# Patient Record
Sex: Male | Born: 1950 | Race: White | Hispanic: No | Marital: Married | Smoking: Never smoker
Health system: Southern US, Community
[De-identification: ages and names within clinical notes are randomized; demographics above are authoritative.]

## PROBLEM LIST (undated history)

## (undated) DIAGNOSIS — J189 Pneumonia, unspecified organism: Secondary | ICD-10-CM

## (undated) DIAGNOSIS — I10 Essential (primary) hypertension: Secondary | ICD-10-CM

## (undated) DIAGNOSIS — G459 Transient cerebral ischemic attack, unspecified: Secondary | ICD-10-CM

## (undated) DIAGNOSIS — S065XAA Traumatic subdural hemorrhage with loss of consciousness status unknown, initial encounter: Principal | ICD-10-CM

## (undated) DIAGNOSIS — N400 Enlarged prostate without lower urinary tract symptoms: Secondary | ICD-10-CM

## (undated) DIAGNOSIS — E7800 Pure hypercholesterolemia, unspecified: Secondary | ICD-10-CM

## (undated) HISTORY — PX: PARS PLANA REPAIR OF RETINAL DEATACHMENT: SHX2165

---

## 2016-10-22 ENCOUNTER — Observation Stay (HOSPITAL_COMMUNITY)
Admission: EM | Admit: 2016-10-22 | Discharge: 2016-10-24 | Disposition: A | Discharge status: Home / Self Care | Payer: Medicare Other | Attending: Family Medicine | Admitting: Family Medicine

## 2016-10-22 ENCOUNTER — Encounter (HOSPITAL_COMMUNITY): Payer: Self-pay

## 2016-10-22 ENCOUNTER — Emergency Department (HOSPITAL_COMMUNITY): Payer: Medicare Other

## 2016-10-22 ENCOUNTER — Other Ambulatory Visit: Payer: Self-pay

## 2016-10-22 DIAGNOSIS — E7800 Pure hypercholesterolemia, unspecified: Secondary | ICD-10-CM | POA: Insufficient documentation

## 2016-10-22 DIAGNOSIS — Z79899 Other long term (current) drug therapy: Secondary | ICD-10-CM | POA: Diagnosis not present

## 2016-10-22 DIAGNOSIS — E785 Hyperlipidemia, unspecified: Secondary | ICD-10-CM | POA: Insufficient documentation

## 2016-10-22 DIAGNOSIS — G8929 Other chronic pain: Secondary | ICD-10-CM

## 2016-10-22 DIAGNOSIS — E669 Obesity, unspecified: Secondary | ICD-10-CM | POA: Insufficient documentation

## 2016-10-22 DIAGNOSIS — N400 Enlarged prostate without lower urinary tract symptoms: Secondary | ICD-10-CM | POA: Insufficient documentation

## 2016-10-22 DIAGNOSIS — G459 Transient cerebral ischemic attack, unspecified: Secondary | ICD-10-CM | POA: Diagnosis present

## 2016-10-22 DIAGNOSIS — H538 Other visual disturbances: Secondary | ICD-10-CM

## 2016-10-22 DIAGNOSIS — R51 Headache: Secondary | ICD-10-CM

## 2016-10-22 DIAGNOSIS — Z880 Allergy status to penicillin: Secondary | ICD-10-CM | POA: Insufficient documentation

## 2016-10-22 DIAGNOSIS — Z87891 Personal history of nicotine dependence: Secondary | ICD-10-CM | POA: Insufficient documentation

## 2016-10-22 DIAGNOSIS — R4781 Slurred speech: Secondary | ICD-10-CM

## 2016-10-22 DIAGNOSIS — H539 Unspecified visual disturbance: Secondary | ICD-10-CM

## 2016-10-22 DIAGNOSIS — Z6831 Body mass index (BMI) 31.0-31.9, adult: Secondary | ICD-10-CM | POA: Insufficient documentation

## 2016-10-22 DIAGNOSIS — N401 Enlarged prostate with lower urinary tract symptoms: Secondary | ICD-10-CM

## 2016-10-22 DIAGNOSIS — R030 Elevated blood-pressure reading, without diagnosis of hypertension: Secondary | ICD-10-CM | POA: Insufficient documentation

## 2016-10-22 DIAGNOSIS — R4789 Other speech disturbances: Secondary | ICD-10-CM

## 2016-10-22 LAB — COMPREHENSIVE METABOLIC PANEL
ALBUMIN: 4.3 g/dL (ref 3.5–5.0)
ALT: 20 U/L (ref 17–63)
AST: 22 U/L (ref 15–41)
Alkaline Phosphatase: 67 U/L (ref 38–126)
Anion gap: 7 (ref 5–15)
BUN: 15 mg/dL (ref 6–20)
CHLORIDE: 109 mmol/L (ref 101–111)
CO2: 24 mmol/L (ref 22–32)
CREATININE: 1.05 mg/dL (ref 0.61–1.24)
Calcium: 9.1 mg/dL (ref 8.9–10.3)
GFR calc Af Amer: >60 mL/min (ref >60)
GFR calc non Af Amer: >60 mL/min (ref >60)
GLUCOSE: 93 mg/dL (ref 65–99)
Potassium: 4.1 mmol/L (ref 3.5–5.1)
Sodium: 140 mmol/L (ref 135–145)
Total Bilirubin: 1.5 mg/dL — ABNORMAL HIGH (ref 0.3–1.2)
Total Protein: 6.6 g/dL (ref 6.5–8.1)

## 2016-10-22 LAB — CBC
HCT: 40.9 % (ref 39.0–52.0)
Hemoglobin: 14.3 g/dL (ref 13.0–17.0)
MCH: 30 pg (ref 26.0–34.0)
MCHC: 35 g/dL (ref 30.0–36.0)
MCV: 85.7 fL (ref 78.0–100.0)
Platelets: 183 10*3/uL (ref 150–400)
RBC: 4.77 MIL/uL (ref 4.22–5.81)
RDW: 13.3 % (ref 11.5–15.5)
WBC: 6.3 10*3/uL (ref 4.0–10.5)

## 2016-10-22 LAB — DIFFERENTIAL
BASOS ABS: 0 10*3/uL (ref 0.0–0.1)
BASOS PCT: 1 %
EOS ABS: 0.1 10*3/uL (ref 0.0–0.7)
Eosinophils Relative: 2 %
LYMPHS ABS: 1.3 10*3/uL (ref 0.7–4.0)
Lymphocytes Relative: 20 %
Monocytes Absolute: 0.3 10*3/uL (ref 0.1–1.0)
Monocytes Relative: 5 %
NEUTROS ABS: 4.5 10*3/uL (ref 1.7–7.7)
NEUTROS PCT: 72 %

## 2016-10-22 LAB — APTT: APTT: 28 s (ref 24–36)

## 2016-10-22 LAB — I-STAT CHEM 8, ED
BUN: 18 mg/dL (ref 6–20)
CREATININE: 1.1 mg/dL (ref 0.61–1.24)
Calcium, Ion: 1.18 mmol/L (ref 1.15–1.40)
Chloride: 107 mmol/L (ref 101–111)
GLUCOSE: 88 mg/dL (ref 65–99)
HEMATOCRIT: 39 % (ref 39.0–52.0)
Hemoglobin: 13.3 g/dL (ref 13.0–17.0)
POTASSIUM: 4 mmol/L (ref 3.5–5.1)
Sodium: 141 mmol/L (ref 135–145)
TCO2: 24 mmol/L (ref 0–100)

## 2016-10-22 LAB — I-STAT TROPONIN, ED: Troponin i, poc: 0 ng/mL (ref 0.00–0.08)

## 2016-10-22 LAB — PROTIME-INR
INR: 0.94
Prothrombin Time: 12.6 seconds (ref 11.4–15.2)

## 2016-10-22 MED ORDER — ASPIRIN 325 MG PO TABS
325.0000 mg | ORAL_TABLET | Freq: Once | ORAL | Status: AC
  Administered 2016-10-23: 325 mg via ORAL
  Filled 2016-10-22: qty 1

## 2016-10-22 MED ORDER — ASPIRIN EC 81 MG PO TBEC
81.0000 mg | DELAYED_RELEASE_TABLET | Freq: Every day | ORAL | Status: DC
  Administered 2016-10-23 – 2016-10-24 (×2): 81 mg via ORAL
  Filled 2016-10-22 (×2): qty 1

## 2016-10-22 MED ORDER — ATORVASTATIN CALCIUM 40 MG PO TABS
40.0000 mg | ORAL_TABLET | Freq: Every day | ORAL | Status: DC
  Administered 2016-10-23 – 2016-10-24 (×2): 40 mg via ORAL
  Filled 2016-10-22 (×2): qty 1

## 2016-10-22 NOTE — ED Notes (Signed)
Pt is A&Ox 4 and ambulatory; no neuro deficits noted at this time; vitals WNL. Pt has been keep NPO per order; Swallow screen has not been completed in Ed;RN waiting for clarification

## 2016-10-22 NOTE — ED Provider Notes (Signed)
[facility]-EMERGENCY DEPT Provider Note   CSN: [id number] Arrival date & time: 10/22/16  1708     History   Chief Complaint Chief Complaint  Patient presents with  . Aphasia    HPI Edwin Dickerson is a 66 y.o. male.  HPI   66 year old male with no pertinent past medical history who presents to the ED with symptoms concerning for TIA with right hemianopic blurry vision and expressive aphasia that occurred 6 hours prior to arrival. Symptoms lasted for approximately 10-15 minutes and resolved spontaneously. No alleviating or aggravating factors. No other associated weakness, numbness, motor aphasia, trouble swallowing, chest pain, shortness of breath, nausea, diaphoresis. They deny any recent fevers, illnesses, or infections. Denies any other physical complaints  History reviewed. No pertinent past medical history.  Patient Active Problem List   Diagnosis Date Noted  . TIA (transient ischemic attack) 10/22/2016  . Blurred vision, bilateral   . Slurred speech   . Transient cerebral ischemia     History reviewed. No pertinent surgical history.     Home Medications    Prior to Admission medications   Not on File    Family History No family history on file.  Social History Social History  Substance Use Topics  . Smoking status: Never Smoker  . Smokeless tobacco: Never Used  . Alcohol use No     Allergies   Penicillins   Review of Systems Review of Systems All other systems are reviewed and are negative for acute change except as noted in the HPI   Physical Exam Updated Vital Signs BP (!) 152/92   Pulse (!) 55   Temp 98.1 F (36.7 C) (Oral)   Resp 16   SpO2 98%   Physical Exam  Constitutional: He is oriented to person, place, and time. He appears well-developed and well-nourished. No distress.  HENT:  Head: Normocephalic and atraumatic.  Nose: Nose normal.  Eyes: Conjunctivae and EOM are normal. Pupils are equal, round, and reactive to light. Right eye  exhibits no discharge. Left eye exhibits no discharge. No scleral icterus.  Neck: Normal range of motion. Neck supple.  Cardiovascular: Normal rate and regular rhythm.  Exam reveals no gallop and no friction rub.   No murmur heard. Pulmonary/Chest: Effort normal and breath sounds normal. No stridor. No respiratory distress. He has no rales.  Abdominal: Soft. He exhibits no distension. There is no tenderness.  Musculoskeletal: He exhibits no edema or tenderness.  Neurological: He is alert and oriented to person, place, and time.  Mental Status: Alert and oriented to person, place, and time. Attention and concentration normal. Speech clear. Able to name various objects. Recent memory is intact  Cranial Nerves  II Visual Fields: Intact to confrontation. Visual fields intact. III, IV, VI: Pupils equal and reactive to light and near. Full eye movement without nystagmus  V Facial Sensation: Normal. No weakness of masticatory muscles  VII: No facial weakness or asymmetry  VIII Auditory Acuity: Grossly normal  IX/X: The uvula is midline; the palate elevates symmetrically  XI: Normal sternocleidomastoid and trapezius strength  XII: The tongue is midline. No atrophy or fasciculations.   Motor System: Muscle Strength: 5/5 and symmetric in the upper and lower extremities. No pronation or drift.  Muscle Tone: Tone and muscle bulk are normal in the upper and lower extremities.   Reflexes: DTRs: 2+ and symmetrical in all four extremities. Plantar responses are flexor bilaterally.  Coordination: Intact finger-to-nose, heel-to-shin, and rapid alternating movements. No tremor.  Sensation: Intact to  light touch, and pinprick. Negative Romberg test.  Gait: Routine and tandem gait are normal    Skin: Skin is warm and dry. No rash noted. He is not diaphoretic. No erythema.  Psychiatric: He has a normal mood and affect.  Vitals reviewed.    ED Treatments / Results  Labs (all labs ordered are listed, but  only abnormal results are displayed) Labs Reviewed  COMPREHENSIVE METABOLIC PANEL - Abnormal; Notable for the following:       Result Value   Total Bilirubin 1.5 (*)    All other components within normal limits  PROTIME-INR  APTT  CBC  DIFFERENTIAL  TSH  I-STAT TROPOININ, ED  I-STAT CHEM 8, ED  CBG MONITORING, ED    EKG  EKG Interpretation  Date/Time:  Friday Oct 22 2016 17:48:50 EDT Ventricular Rate:  54 PR Interval:  174 QRS Duration: 122 QT Interval:  474 QTC Calculation: 449 R Axis:   -38 Text Interpretation:  Sinus bradycardia Left axis deviation Septal infarct , age undetermined Abnormal ECG no stemi no prior tracing for comparison   Confirmed by [facility] LLCCARDAMA MD, PEDRO [phone]([id number]) on 10/22/2016 9:00:15 PM       Radiology Ct Head Wo Contrast  Result Date: 10/22/2016 CLINICAL DATA:  66 y/o M; blurred right-sided vision and difficulty speaking. EXAM: CT HEAD WITHOUT CONTRAST TECHNIQUE: Contiguous axial images were obtained from the base of the skull through the vertex without intravenous contrast. COMPARISON:  None. FINDINGS: Brain: No evidence of acute infarction, hemorrhage, hydrocephalus, extra-axial collection or mass lesion/mass effect. Vascular: No hyperdense vessel or unexpected calcification. Skull: Normal. Negative for fracture or focal lesion. Sinuses/Orbits: No acute finding. Other: None. IMPRESSION: No acute intracranial abnormality identified. Unremarkable CT of the head. Electronically Signed   By: Mitzi HansenLance  Furusawa-Stratton M.D.   On: 10/22/2016 18:28    Procedures Procedures (including critical care time)  Medications Ordered in ED Medications  aspirin tablet 325 mg (0 mg Oral Hold 10/22/16 2325)  aspirin EC tablet 81 mg (not administered)  atorvastatin (LIPITOR) tablet 40 mg (not administered)     Initial Impression / Assessment and Plan / ED Course  I have reviewed the triage vital signs and the nursing notes.  Pertinent labs & imaging results that were  available during my care of the patient were reviewed by me and considered in my medical decision making (see chart for details).     Presentation concerning for TIA. Labs grossly reassuring. CT head without evidence of mass lesion or intracranial hemorrhage. Discussed case with neurology who recommended admission for TIA workup. Admitted to family medicine for further workup and management.  Final Clinical Impressions(s) / ED Diagnoses   Final diagnoses:  TIA (transient ischemic attack)  TIA (transient ischemic attack)      Cardama, Amadeo GarnetPedro Eduardo, MD 10/22/16 [phone]

## 2016-10-22 NOTE — ED Notes (Signed)
RN page MD for Oral med clarification due to NPO diet and if Swallow screen to be done yet; MRI states it will be a while before patient can be scanned will come for patient on floor.

## 2016-10-22 NOTE — H&P (Signed)
Family Medicine Teaching [facility] Admission History and Physical Service Pager: [phone]  Patient name: Edwin Dickerson Medical record number: [id number] Date of birth: Nov 26, 1950 Age: 66 y.o. Gender: male  Primary Care Provider: No primary care provider on file. Consultants: Neurology Code Status: Full   Chief Complaint: Right eye blurriness and difficulty with speech  Assessment and Plan: Gearold Wainer is a 66 y.o. male with a past medical history significant for BPH presenting with right eye lateral hemianopsia and expressive aphasia concerning for TIA.  #Right Eye Hemianopsia/expressive aphasia, acute, resolved Patient presented to the ED from urgent care after having earlier today what he described sas right eye lateral hemianopsia that lasted about 10 min as well as expressive aphasia that lasted about 15 min. Both symptoms self resolved and patient returned to his baseline. Patient also noticed some ear pain and mild dizziness accompaning symptoms which eventually resolved. Symptoms reported and quick resolution most consistent with TIA. Patient risk factors are difficult to assess at this moment given the fact that he has not seen a PCP in about 20 years. Only change from baseline reported by patient and significant other was increase tiredness in the past week or two. Initial work up is negative for stroke with a head CT without any abnormal findings. Patient does not have any deficits and neurologic exam is unremarkable. Patient mentioned that he was told he had elevated cholesterol during his life insurance workup and has a brief history of smoking (1 year) and smokeless tobacco use. From a cardiac standpoint , no arrhythmias or signs of cardiac dysfunction noted, troponin was negative and no carotid bruits or sign of volume overload noted during our exam. Bradycardia noted and EKG was consistent with sinus rhythm, with left axis deviation and possible septal infarct. BP was  essentially normal on admission at 135/90 since then is mildly elevated 152/92. Will allow permissive  hypertension in the setting of TIA workup. --Admit to FMTS, admitting physician Dr.Fletke --Admit to telemetry  --Order MRA/MRI --Order Carotid Dopplers --Order ECHO --Follow up on TSH, A1c and Lipid panel --Start Aspirin 325 mg once today and transition to 81 mg daily tomorrow --Lipitor 40mg  --Follow up on am CBC, BMP --Will follow up on Neurology consult, appreciate recs  #Elevated BP, acute Patient does not have hypertension and does not take any medications for blood pressure. Wife has HTN and owns a BP cuff at home and has been taking his BP intermittently at home an reports that they have been within normal limits. Elevated BP could be secondary to TIA event as well as reported global HA. --Will continue to monitor --Allow for permissive hypertension  #Headaches, acute, ongoing Patient reports that HA started in the ED on admission. He reports the HA as global and dull without any photophobia/phonophobia, throbbing or any other concerning symptoms. He rates the pain 3/10. Most likely secondary to low po intake today and mild dehydration. Not concerning for cerebral bleed, given negative CTA --Will continue to monitor --Tylenol 650 mg q6 prn for pain  #BPH, chronic Patient was started on cialis about a year ago for enlarged prostate and has not has any problems since. Symptoms appears to be well controlled. --Will resume home Cialis   FEN/GI: NPO until patient passes swallow study, will transition to regular diet Prophylaxis: SCDs  Disposition: Anticipate discharge home tomorrow after TIA workup is complete  History of Present Illness:  Edwin Dickerson is a 66 y.o. male with a past medical history significant for BPH who  presented to the ED with right eye lateral blindness and difficulty with speech. Patient reports that half of his right eye was blurry (lateral aspect) at 1PM and   Lasted about 10 minutes before self resolving. Patient also reports that a few minutes later he noticed that he couldn't get his words out and had difficulty finding the word he wanted to use. Wife reports that he had slurred speech for about about 15 mins but it resolved on its own. Patient's wife checked his BP and it was high, so they decided to go to [facility] around 2pm. After discussion with provider they were told they needed to go to the ED for further evaluation. In the the last week, patient has felt more tired than usual. Patient also reports "woosiness" during event but denies any dizziness. Patient has a history of  right inner ear problems that affect his balance at time, but felt "woosiness" felt different form his vestibular problems. He did however reports right ear pain this afternoon. Patient denies any chest pain, palpitations, shortness of breath, abdominal pain, diaphoresis, difficulty with gait, or falls. Of note patient does not have a PCP. He was last seen by one about 20 years ago. Patient has been going to Urgent Care for his medical problems. He is currently on Cialis for BPH and was started on that by a urologist last year. Otherwise, pateint is healthy and does not take any other medications and denies any other chronic problems. He has a history of smokeless tobacco use and 1 year smoking history when he was 66 yo. Patient denies any drugs and alcohol use. No surgical history, family history significant for lung cancer (father), cardiac pacemaker (mother). In the ED, patient had a Head CT with normal findings, CBC and BMP were within normal limits. EKG show sinus bradycardia and troponin was negative x1. Neurology was consulted and recommended admission for TIA workup.   Review Of Systems: Per HPI with the following additions:   Review of Systems  All other systems reviewed and are negative.   There are no active problems to display for this patient.   Past Medical  History: History reviewed. No pertinent past medical history.  Past Surgical History: History reviewed. No pertinent surgical history.  Social History: Social History  Substance Use Topics  . Smoking status: Never Smoker  . Smokeless tobacco: Never Used  . Alcohol use No  Remote history of smokeless tobacco use and 1 year smoking history when he was 66 yo Denies alcohol use or illicit drug use  Family History: No family history of MI or stroke per patient Mother: pacemaker  Father: lung cancer   Allergies and Medications: Allergies  Allergen Reactions  . Penicillins    No current facility-administered medications on file prior to encounter.    No current outpatient prescriptions on file prior to encounter.    Objective: BP (!) 153/107   Pulse (!) 53   Temp 98.1 F (36.7 C) (Oral)   Resp 19   SpO2 99%  Physical exam: General: NAD, pleasant, able to participate in exam Cardiac: RRR, normal heart sounds, no murmurs. 2+ radial and PT pulses bilaterally Respiratory: CTAB, normal effort, No wheezes, rales or rhonchi Abdomen: soft, nontender, nondistended, no hepatic or splenomegaly, +BS Extremities: no edema or cyanosis. WWP. Skin: warm and dry, no rashes noted Neuro: No visual deficit, EOM intact, visual field test normal, CNII-XII were intact, 5/5 strength upper and lower extremities, Sensation intact bilaterally UE and LE.  Neurologic Exam   Gait, Coordination, and Reflexes   Gait Gait: normal  Coordination  Romberg: negative Finger to nose coordination: normal Heel to shin coordination: normal Tandem walking coordination: normal  Tremor  Resting tremor: absent  Reflexes  Right patellar: 2+ Left patellar: 2+ Right plantar: normal Left plantar: normal  Psych: Normal affect and mood Labs and Imaging: CBC BMET   Recent Labs Lab 10/22/16 1808 10/22/16 1827  WBC 6.3  --   HGB 14.3 13.3  HCT 40.9 39.0  PLT 183  --     Recent Labs Lab  10/22/16 1808 10/22/16 1827  NA 140 141  K 4.1 4.0  CL 109 107  CO2 24  --   BUN 15 18  CREATININE 1.05 1.10  GLUCOSE 93 88  CALCIUM 9.1  --     istat trop: 0.00 EKG: sinus bradycardia, no ischemic changes noted  Ct Head Wo Contrast Result Date: 10/22/2016 CLINICAL DATA:  66 y/o M; blurred right-sided vision and difficulty speaking. EXAM: CT HEAD WITHOUT CONTRAST TECHNIQUE: Contiguous axial images were obtained from the base of the skull through the vertex without intravenous contrast. COMPARISON:  None. FINDINGS: Brain: No evidence of acute infarction, hemorrhage, hydrocephalus, extra-axial collection or mass lesion/mass effect. Vascular: No hyperdense vessel or unexpected calcification. Skull: Normal. Negative for fracture or focal lesion. Sinuses/Orbits: No acute finding. Other: None. IMPRESSION: No acute intracranial abnormality identified. Unremarkable CT of the head. Electronically Signed   By: Mitzi HansenLance  Furusawa-Stratton M.D.   On: 10/22/2016 18:28    Lovena NeighboursAbdoulaye Diallo, MD 10/22/2016, 10:13 PM PGY-1, [facility] FPTS Intern pager: [phone], text pages welcome  UPPER LEVEL ADDENDUM  I have read the above note and made revisions highlighted in blue.  Palma HolterKanishka G Grigor Lipschutz, MD PGY-2 [facility] Pager [phone]  .

## 2016-10-22 NOTE — ED Notes (Signed)
Admitting Provider at bedside. 

## 2016-10-22 NOTE — ED Triage Notes (Signed)
Pt reports today at 1pm he noticed blurry vision to half of his right eye. Then at 1:45pm pt experiences expressive aphasia and could not get the words he wanted to say out, such as the word "balloon." This lasted about 15 mins, per pt. Pt denies numbness, tingling or any weakness during any of these symptoms. Pt currently speaking in clear complete sentences, NAD.

## 2016-10-22 NOTE — ED Notes (Signed)
ED Provider at bedside. 

## 2016-10-23 ENCOUNTER — Observation Stay (HOSPITAL_COMMUNITY): Payer: Medicare Other

## 2016-10-23 ENCOUNTER — Encounter (HOSPITAL_COMMUNITY): Payer: Self-pay

## 2016-10-23 DIAGNOSIS — N401 Enlarged prostate with lower urinary tract symptoms: Secondary | ICD-10-CM

## 2016-10-23 DIAGNOSIS — R51 Headache: Secondary | ICD-10-CM

## 2016-10-23 DIAGNOSIS — G459 Transient cerebral ischemic attack, unspecified: Secondary | ICD-10-CM | POA: Diagnosis not present

## 2016-10-23 DIAGNOSIS — R4789 Other speech disturbances: Secondary | ICD-10-CM | POA: Diagnosis not present

## 2016-10-23 DIAGNOSIS — H539 Unspecified visual disturbance: Secondary | ICD-10-CM

## 2016-10-23 DIAGNOSIS — G458 Other transient cerebral ischemic attacks and related syndromes: Secondary | ICD-10-CM | POA: Diagnosis not present

## 2016-10-23 DIAGNOSIS — R030 Elevated blood-pressure reading, without diagnosis of hypertension: Secondary | ICD-10-CM | POA: Diagnosis not present

## 2016-10-23 DIAGNOSIS — R519 Headache, unspecified: Secondary | ICD-10-CM

## 2016-10-23 LAB — LIPID PANEL
CHOL/HDL RATIO: 7.2 ratio
Cholesterol: 236 mg/dL — ABNORMAL HIGH (ref 0–200)
HDL: 33 mg/dL — AB (ref >40)
LDL Cholesterol: 171 mg/dL — ABNORMAL HIGH (ref 0–99)
Triglycerides: 158 mg/dL — ABNORMAL HIGH (ref <150)
VLDL: 32 mg/dL (ref 0–40)

## 2016-10-23 LAB — TSH: TSH: 5.761 u[IU]/mL — AB (ref 0.350–4.500)

## 2016-10-23 LAB — T4, FREE: FREE T4: 0.92 ng/dL (ref 0.61–1.12)

## 2016-10-23 LAB — CK: CK TOTAL: 95 U/L (ref 49–397)

## 2016-10-23 MED ORDER — ACETAMINOPHEN 650 MG RE SUPP: 650.0000 mg | RECTAL | Status: DC | PRN

## 2016-10-23 MED ORDER — ACETAMINOPHEN 325 MG PO TABS: 650.0000 mg | ORAL_TABLET | ORAL | Status: DC | PRN

## 2016-10-23 MED ORDER — STROKE: EARLY STAGES OF RECOVERY BOOK
Freq: Once | Status: AC
  Administered 2016-10-23: 03:00:00
  Filled 2016-10-23: qty 1

## 2016-10-23 MED ORDER — ACETAMINOPHEN 160 MG/5ML PO SOLN: 650.0000 mg | ORAL | Status: DC | PRN

## 2016-10-23 NOTE — Consult Note (Signed)
Referring Physician: Dr. Ree Kida    Chief Complaint: Expressive aphasia  HPI: Edwin Dickerson is an 66 y.o. male who presented to the [facility] ED with an episode of hemianopic blurry vision in the right eye (temporal visual field) and expressive aphasia. Symptom onset was at about 1 PM on Friday, initially with the vision complaint, followed about 45 minutes with onset of expressive aphasia. He "could not get the words out" for items in his home such as a balloon. The speech deficit lasted for about 15 minutes. There was no associated weakness or sensory numbness. In the ED, speech was clear and vision complaint had resolved.   History reviewed. No pertinent past medical history.  History reviewed. No pertinent surgical history.  No family history on file. Social History:  reports that he has never smoked. He has never used smokeless tobacco. He reports that he does not drink alcohol or use drugs.  Allergies:  Allergies  Allergen Reactions  . Penicillins     Medications:  Prior to Admission:  No prescriptions prior to admission.   Scheduled: . aspirin EC  81 mg Oral Daily  . atorvastatin  40 mg Oral q1800    ROS: No CP, SOB, nausea or fever. Other ROS as per HPI.   Physical Examination: Blood pressure 133/73, pulse (!) 55, temperature 97.9 F (36.6 C), temperature source Oral, resp. rate 18, height '5\' 7"'  (1.702 m), weight 90.3 kg (199 lb), SpO2 97 %.  HEENT: [place]/AT Lungs: Respirations unlabored Ext: Warm and well-perfused  Neurologic Examination: Mental Status: Alert, oriented, thought content appropriate.  Speech fluent. Some difficulty with repetition. Naming intact. Dyscalculia noted. No dysarthria. Has some difficulty with directional 2-step commands. Good eye contact. Pleasant and cooperative. Good fund of knowledge.  Cranial Nerves: II:  Visual fields intact bilaterally. PERRL. III,IV, VI: ptosis not present. Gaze is conjugate. Normal saccades and visual pursuits bilaterally.    V,VII: smile symmetric, facial temp sensation decreased on right VIII: hearing intact to conversation IX,X:no hypophonia or hoarseness XI: symmetric XII: midline tongue extension  Motor: Right : Upper extremity   5/5    Left:     Upper extremity   5/5  Lower extremity   5/5     Lower extremity   5/5 No pronator drift. Tone normal.  Sensory: Temp and light touch intact x 4. No extinction. Deep Tendon Reflexes:  2+ bilateral upper and lower extremities.  Plantars: Right: downgoing  Left: downgoing Cerebellar: No ataxia with FNF bilaterally Gait: Deferred   Results for orders placed or performed during the hospital encounter of 10/22/16 (from the past 48 hour(s))  Protime-INR     Status: None   Collection Time: 10/22/16  6:08 PM  Result Value Ref Range   Prothrombin Time 12.6 11.4 - 15.2 seconds   INR 0.94   APTT     Status: None   Collection Time: 10/22/16  6:08 PM  Result Value Ref Range   aPTT 28 24 - 36 seconds  CBC     Status: None   Collection Time: 10/22/16  6:08 PM  Result Value Ref Range   WBC 6.3 4.0 - 10.5 K/uL   RBC 4.77 4.22 - 5.81 MIL/uL   Hemoglobin 14.3 13.0 - 17.0 g/dL   HCT 40.9 39.0 - 52.0 %   MCV 85.7 78.0 - 100.0 fL   MCH 30.0 26.0 - 34.0 pg   MCHC 35.0 30.0 - 36.0 g/dL   RDW 13.3 11.5 - 15.5 %   Platelets  183 150 - 400 K/uL  Differential     Status: None   Collection Time: 10/22/16  6:08 PM  Result Value Ref Range   Neutrophils Relative % 72 %   Neutro Abs 4.5 1.7 - 7.7 K/uL   Lymphocytes Relative 20 %   Lymphs Abs 1.3 0.7 - 4.0 K/uL   Monocytes Relative 5 %   Monocytes Absolute 0.3 0.1 - 1.0 K/uL   Eosinophils Relative 2 %   Eosinophils Absolute 0.1 0.0 - 0.7 K/uL   Basophils Relative 1 %   Basophils Absolute 0.0 0.0 - 0.1 K/uL  Comprehensive metabolic panel     Status: Abnormal   Collection Time: 10/22/16  6:08 PM  Result Value Ref Range   Sodium 140 135 - 145 mmol/L   Potassium 4.1 3.5 - 5.1 mmol/L   Chloride 109 101 - 111 mmol/L   CO2  24 22 - 32 mmol/L   Glucose, Bld 93 65 - 99 mg/dL   BUN 15 6 - 20 mg/dL   Creatinine, Ser 1.05 0.61 - 1.24 mg/dL   Calcium 9.1 8.9 - 10.3 mg/dL   Total Protein 6.6 6.5 - 8.1 g/dL   Albumin 4.3 3.5 - 5.0 g/dL   AST 22 15 - 41 U/L   ALT 20 17 - 63 U/L   Alkaline Phosphatase 67 38 - 126 U/L   Total Bilirubin 1.5 (H) 0.3 - 1.2 mg/dL   GFR calc non Af Amer >60 >60 mL/min   GFR calc Af Amer >60 >60 mL/min    Comment: (NOTE) The eGFR has been calculated using the CKD EPI equation. This calculation has not been validated in all clinical situations. eGFR's persistently <60 mL/min signify possible Chronic Kidney Disease.    Anion gap 7 5 - 15  I-stat troponin, ED     Status: None   Collection Time: 10/22/16  6:25 PM  Result Value Ref Range   Troponin i, poc 0.00 0.00 - 0.08 ng/mL   Comment 3            Comment: Due to the release kinetics of cTnI, a negative result within the first hours of the onset of symptoms does not rule out myocardial infarction with certainty. If myocardial infarction is still suspected, repeat the test at appropriate intervals.   I-Stat Chem 8, ED     Status: None   Collection Time: 10/22/16  6:27 PM  Result Value Ref Range   Sodium 141 135 - 145 mmol/L   Potassium 4.0 3.5 - 5.1 mmol/L   Chloride 107 101 - 111 mmol/L   BUN 18 6 - 20 mg/dL   Creatinine, Ser 1.10 0.61 - 1.24 mg/dL   Glucose, Bld 88 65 - 99 mg/dL   Calcium, Ion 1.18 1.15 - 1.40 mmol/L   TCO2 24 0 - 100 mmol/L   Hemoglobin 13.3 13.0 - 17.0 g/dL   HCT 39.0 39.0 - 52.0 %  TSH     Status: Abnormal   Collection Time: 10/22/16 11:05 PM  Result Value Ref Range   TSH 5.761 (H) 0.350 - 4.500 uIU/mL    Comment: Performed by a 3rd Generation assay with a functional sensitivity of <=0.01 uIU/mL.  Lipid panel     Status: Abnormal   Collection Time: 10/23/16  2:21 AM  Result Value Ref Range   Cholesterol 236 (H) 0 - 200 mg/dL   Triglycerides 158 (H) <150 mg/dL   HDL 33 (L) >40 mg/dL   Total  CHOL/HDL Ratio 7.2  RATIO   VLDL 32 0 - 40 mg/dL   LDL Cholesterol 171 (H) 0 - 99 mg/dL    Comment:        Total Cholesterol/HDL:CHD Risk Coronary Heart Disease Risk Table                     Men   Women  1/2 Average Risk   3.4   3.3  Average Risk       5.0   4.4  2 X Average Risk   9.6   7.1  3 X Average Risk  23.4   11.0        Use the calculated Patient Ratio above and the CHD Risk Table to determine the patient's CHD Risk.        ATP III CLASSIFICATION (LDL):  <100     mg/dL   Optimal  100-129  mg/dL   Near or Above                    Optimal  130-159  mg/dL   Borderline  160-189  mg/dL   High  >190     mg/dL   Very High    Ct Head Wo Contrast  Result Date: 10/22/2016 CLINICAL DATA:  66 y/o M; blurred right-sided vision and difficulty speaking. EXAM: CT HEAD WITHOUT CONTRAST TECHNIQUE: Contiguous axial images were obtained from the base of the skull through the vertex without intravenous contrast. COMPARISON:  None. FINDINGS: Brain: No evidence of acute infarction, hemorrhage, hydrocephalus, extra-axial collection or mass lesion/mass effect. Vascular: No hyperdense vessel or unexpected calcification. Skull: Normal. Negative for fracture or focal lesion. Sinuses/Orbits: No acute finding. Other: None. IMPRESSION: No acute intracranial abnormality identified. Unremarkable CT of the head. Electronically Signed   By: Kristine Garbe M.D.   On: 10/22/2016 18:28   Mr Brain Wo Contrast  Result Date: 10/23/2016 CLINICAL DATA:  Transient RIGHT lateral hemianopsia and aphasia, ear pain and mild dizziness. Evaluate transient ischemic attack. EXAM: MRI HEAD WITHOUT CONTRAST MRA HEAD WITHOUT CONTRAST TECHNIQUE: Multiplanar, multiecho pulse sequences of the brain and surrounding structures were obtained without intravenous contrast. Angiographic images of the head were obtained using MRA technique without contrast. COMPARISON:  CT HEAD Oct 22, 2016 FINDINGS: MRI HEAD FINDINGS BRAIN: No  reduced diffusion to suggest acute ischemia. No susceptibility artifact to suggest hemorrhage. The ventricles and sulci are normal for patient's age. A few punctate supratentorial white matter FLAIR T2 hyperintensities are less than expected for age, consistent with chronic small vessel ischemic disease. No suspicious parenchymal signal, masses or mass effect. No abnormal extra-axial fluid collections. VASCULAR: Normal major intracranial vascular flow voids present at skull base. SKULL AND UPPER CERVICAL SPINE: No abnormal sellar expansion. No suspicious calvarial bone marrow signal. Craniocervical junction maintained. SINUSES/ORBITS: The mastoid air-cells and included paranasal sinuses are well-aerated. The included ocular globes and orbital contents are non-suspicious. OTHER: None. MRA HEAD FINDINGS ANTERIOR CIRCULATION: Normal flow related enhancement of the included cervical, petrous, cavernous and supraclinoid internal carotid arteries. Patent anterior communicating artery. Normal flow related enhancement of the anterior and middle cerebral arteries, including distal segments. No large vessel occlusion, high-grade stenosis, abnormal luminal irregularity, aneurysm. POSTERIOR CIRCULATION: Codominant vertebral artery's. Basilar artery is patent, with normal flow related enhancement of the main branch vessels. Normal flow related enhancement of the posterior cerebral arteries. Robust LEFT in tiny RIGHT posterior communicating arteries present. No large vessel occlusion, high-grade stenosis, abnormal luminal irregularity, aneurysm. ANATOMIC VARIANTS: None. Source images  and MIP images were reviewed. IMPRESSION: Normal noncontrast MRI head. Normal MRA head. Electronically Signed   By: Elon Alas M.D.   On: 10/23/2016 04:13   Mr Jodene Nam Head/brain VO Cm  Result Date: 10/23/2016 CLINICAL DATA:  Transient RIGHT lateral hemianopsia and aphasia, ear pain and mild dizziness. Evaluate transient ischemic attack. EXAM:  MRI HEAD WITHOUT CONTRAST MRA HEAD WITHOUT CONTRAST TECHNIQUE: Multiplanar, multiecho pulse sequences of the brain and surrounding structures were obtained without intravenous contrast. Angiographic images of the head were obtained using MRA technique without contrast. COMPARISON:  CT HEAD Oct 22, 2016 FINDINGS: MRI HEAD FINDINGS BRAIN: No reduced diffusion to suggest acute ischemia. No susceptibility artifact to suggest hemorrhage. The ventricles and sulci are normal for patient's age. A few punctate supratentorial white matter FLAIR T2 hyperintensities are less than expected for age, consistent with chronic small vessel ischemic disease. No suspicious parenchymal signal, masses or mass effect. No abnormal extra-axial fluid collections. VASCULAR: Normal major intracranial vascular flow voids present at skull base. SKULL AND UPPER CERVICAL SPINE: No abnormal sellar expansion. No suspicious calvarial bone marrow signal. Craniocervical junction maintained. SINUSES/ORBITS: The mastoid air-cells and included paranasal sinuses are well-aerated. The included ocular globes and orbital contents are non-suspicious. OTHER: None. MRA HEAD FINDINGS ANTERIOR CIRCULATION: Normal flow related enhancement of the included cervical, petrous, cavernous and supraclinoid internal carotid arteries. Patent anterior communicating artery. Normal flow related enhancement of the anterior and middle cerebral arteries, including distal segments. No large vessel occlusion, high-grade stenosis, abnormal luminal irregularity, aneurysm. POSTERIOR CIRCULATION: Codominant vertebral artery's. Basilar artery is patent, with normal flow related enhancement of the main branch vessels. Normal flow related enhancement of the posterior cerebral arteries. Robust LEFT in tiny RIGHT posterior communicating arteries present. No large vessel occlusion, high-grade stenosis, abnormal luminal irregularity, aneurysm. ANATOMIC VARIANTS: None. Source images and MIP  images were reviewed. IMPRESSION: Normal noncontrast MRI head. Normal MRA head. Electronically Signed   By: Elon Alas M.D.   On: 10/23/2016 04:13    Assessment: 66 y.o. male with transient neurological symptoms best localizable to the left cerebral hemisphere (left PCA territory followed by left MCA territory).  1. DDx includes TIA and migraine accompaniment. The latter consideration is somewhat more likely given onset of a dull headache noted by patient while in the ED, but also would be somewhat atypical given the patient's age.  2. MRI brain normal.  3. MRA head normal.  4. Stroke Risk Factors - elevated cholesterol 5. Elevated TSH. May reflect hypothyroidism, which could relate to the language dysfunction noted on exam. 6. Also on DDx for the speech component of his neurological presentation is incipient dementia with cognitive fluctuation 7. No a-fib on EKG  Plan: 1. Echocardiogram 2. Carotid dopplers 3. PT consult, OT consult, Speech consult 4. HgbA1c 5. Frequent neuro checks 6. Permissive HTN x 24 hours 7. Telemetry monitoring 8. Agree with ASA 325 mg x 1 followed by regular daily ASA at 81 mg.  9. Agree with starting Lipitor 40 mg po qd. Obtain baseline CK level.   '@Electronically'  signed: Dr. Kerney Elbe  10/23/2016, 5:35 AM

## 2016-10-23 NOTE — Progress Notes (Signed)
Pt. Transferred from ER via stretcher to room 5C-07; alert and oriented x4; wife with pt.; instructed pt./wife on call button and hospital policies, and policy re: valuables. Pt. has a deficit In reading.

## 2016-10-23 NOTE — Progress Notes (Signed)
STROKE TEAM PROGRESS NOTE   HISTORY OF PRESENT ILLNESS (per record) Edwin Dickerson is an 66 y.o. male who presented to the [facility] ED with an episode of hemianopic blurry vision in the right eye (temporal visual field) and expressive aphasia. Symptom onset was at about 1 PM on Friday, initially with the vision complaint, followed about 45 minutes with onset of expressive aphasia. He "could not get the words out" for items in his home such as a balloon. The speech deficit lasted for about 15 minutes. There was no associated weakness or sensory numbness. In the ED, speech was clear and vision complaint had resolved.    SUBJECTIVE (INTERVAL HISTORY) His wife is at bedside. His symptoms have resolved and he feels good.   OBJECTIVE Temp:  [97.7 F (36.5 C)-98.2 F (36.8 C)] 97.7 F (36.5 C) (05/26 0644) Pulse Rate:  [53-71] 56 (05/26 0644) Cardiac Rhythm: Normal sinus rhythm (05/26 0130) Resp:  [15-20] 18 (05/26 0644) BP: (114-153)/(69-107) 119/80 (05/26 0644) SpO2:  [97 %-99 %] 98 % (05/26 0644) Weight:  [90.3 kg (199 lb)] 90.3 kg (199 lb) (05/26 0059)  CBC:  Recent Labs Lab 10/22/16 1808 10/22/16 1827  WBC 6.3  --   NEUTROABS 4.5  --   HGB 14.3 13.3  HCT 40.9 39.0  MCV 85.7  --   PLT 183  --     Basic Metabolic Panel:  Recent Labs Lab 10/22/16 1808 10/22/16 1827  NA 140 141  K 4.1 4.0  CL 109 107  CO2 24  --   GLUCOSE 93 88  BUN 15 18  CREATININE 1.05 1.10  CALCIUM 9.1  --     Lipid Panel:    Component Value Date/Time   CHOL 236 (H) 10/23/2016 0221   TRIG 158 (H) 10/23/2016 0221   HDL 33 (L) 10/23/2016 0221   CHOLHDL 7.2 10/23/2016 0221   VLDL 32 10/23/2016 0221   LDLCALC 171 (H) 10/23/2016 0221   HgbA1c: No results found for: HGBA1C Urine Drug Screen: No results found for: LABOPIA, COCAINSCRNUR, LABBENZ, AMPHETMU, THCU, LABBARB  Alcohol Level No results found for: ETH  IMAGING  Ct Head Wo Contrast 10/22/2016 No acute intracranial abnormality identified.  Unremarkable CT of the head.     Mr Brain Wo Contrast 10/23/2016 Normal noncontrast MRI head. Normal MRA head.     Mr Maxine Glenn Head/brain Wo Cm 10/23/2016 Normal noncontrast MRI head.  Normal MRA head.    PHYSICAL EXAM   Physical exam: Exam: Gen: NAD, conversant, well nourised, obese, well groomed                     CV: RRR, no MRG. No Carotid Bruits. No peripheral edema, warm, nontender Eyes: Conjunctivae clear without exudates or hemorrhage  Neuro: Detailed Neurologic Exam  Speech:    Speech is normal; fluent and spontaneous with normal comprehension.  Cognition:    The patient is oriented to person, place, and time;     recent and remote memory intact;     language fluent;     normal attention, concentration, fund of knowledge Cranial Nerves:    The pupils are equal, round, and reactive to light. Visual fields are full to finger confrontation. Extraocular movements are intact. Trigeminal sensation is intact and the muscles of mastication are normal. The face is symmetric. The palate elevates in the midline. Hearing intact. Voice is normal. Shoulder shrug is normal. The tongue has normal motion without fasciculations.   Coordination:    No  dysmetria  Motor Observation:    No asymmetry, no atrophy, and no involuntary movements noted. Tone:    Normal muscle tone.    Posture:    Posture is normal. normal erect    Strength:    Strength is V/V in the upper and lower limbs.      Sensation: intact to LT     Reflex Exam:  DTR's:    Deep tendon reflexes in the upper and lower extremities are symmetrical bilaterally.       ASSESSMENT/PLAN [name] is a 66 y.o. male with no significant past medical history presenting with expressive aphasia and visual changes. He did not receive IV t-PA due to resolution of deficits.  Possible TIA:    Resultant  Resolution of symptoms  CT head - No acute intracranial abnormality identified  MRI head - normal  MRA  head - normal  Carotid Doppler - pending  2D Echo - pending  LDL - 171  HgbA1c - pending  VTE prophylaxis - SCDs  Diet Heart Room service appropriate? Yes; Fluid consistency: Thin  No antithrombotic prior to admission, now on aspirin 81 mg daily. Prefer ASA 325mg , literature is mixed but 325mg  may be more protective than 81mg .   Patient counseled to be compliant with his antithrombotic medications  Ongoing aggressive stroke risk factor management  Therapy recommendations: pending  Disposition:  Pending  Hypertension  Stable - mildly low at times  Permissive hypertension (OK if < 220/120) but gradually normalize in 5-7 days  Long-term BP goal normotensive  Hyperlipidemia  Home meds:  No lipid lowering medications prior to admission   LDL 171, goal < 70  Now on Lipitor 40 mg daily  Continue statin at discharge    Other Stroke Risk Factors  Advanced age  Obesity, Body mass index is 31.17 kg/m., recommend weight loss, diet and exercise as appropriate    Other Active Problems    Hospital day # 0   Personally examined patient and images, and have participated in and made any corrections needed to history, physical, neuro exam,assessment and plan as stated above.  I have personally obtained the history, evaluated lab date, reviewed imaging studies and agree with radiology interpretations.    Naomie DeanAntonia Ahern, MD Stroke Neurology   To contact Stroke Continuity provider, please refer to [email or web address]. After hours, contact General Neurology

## 2016-10-23 NOTE — Plan of Care (Signed)
Problem: Education: Goal: Knowledge of [facility] Education information/materials will improve Outcome: Progressing POC reviewed with pt./wife.   

## 2016-10-23 NOTE — Progress Notes (Signed)
Family Medicine Teaching Service Daily Progress Note Intern Pager: [phone]  Patient name: Edwin Dickerson Medical record number: [id number] Date of birth: [birth year] Age: 66 y.o. Gender: male  Primary Care Provider: System, Pcp Not In Consultants: Neurology Code Status: Full Code  Pt Overview and Major Events to Date:  Admitted on 5/25 Seen by Neurology 5/26  Assessment and Plan: Edwin Dickerson is a 66 y.o. male with a past medical history significant for BPH presenting with right eye lateral hemianopsia and expressive aphasia concerning for TIA.  #Right Eye Hemianopsia/expressive aphasia, acute, resolved Patient seen by neurology who seem to agree that symptoms are consistent with TIA with left PCA and MCA involvement affecting the left cerebral hemisphere. Initial workup as reveal total cholesterol 236, HDL 33,LDL171, triglycerides 158. TSH is also elevated at 5.761. Normal MRI and MRA. Awaiting risk stratification test. --Admit to telemetry  --Follow up on Carotid Dopplers --Follow up on ECHO --Follow up on A1c  --Will start 81 mg daily tomorrow --Continue Lipitor 40 mg daily  --Will follow up on Neurology consult, appreciate recs  #Elevated BP, acute This am BP is at baseline 119/80. Patient does not have hypertension and does not take any medications for blood pressure. Elevated BP could have been secondary to TIA event as well as reported global HA. --Will continue to monitor --Allow for permissive hypertension  #Headaches, acute, ongoing Patient reports that HA started in the ED on admission. He reports the HA as global and dull without any photophobia/phonophobia, throbbing or any other concerning symptoms. He rates the pain 3/10. Most likely secondary to low po intake today and mild dehydration. Not concerning for cerebral bleed, given negative CTA, MRI/MRA  --Will continue to monitor --Tylenol 650 mg q6 prn for pain  #BPH, chronic Patient was started on cialis about a  year ago for enlarged prostate and has not has any problems since. Symptoms appears to be well controlled. --Will resume home Cialis  #Elevated TSH --Will order free T4  FEN/GI: Heart healthy Diet  PPx: SCDs  Disposition: Anticipate discharge after completion of TIA workup.  Subjective:  Patient is doing well this morning, headaches have resolved and patient was able to eat. He is ambulating and would like to go home today. Discussed treatment plan and increase risks of stroke in the first 24-48hrs post TIA. Patient in agreement with plan  Objective: Temp:  [97.7 F (36.5 C)-98.2 F (36.8 C)] 97.7 F (36.5 C) (05/26 0644) Pulse Rate:  [53-71] 56 (05/26 0644) Resp:  [15-20] 18 (05/26 0644) BP: (114-153)/(69-107) 119/80 (05/26 0644) SpO2:  [97 %-99 %] 98 % (05/26 0644) Weight:  [199 lb (90.3 kg)] 199 lb (90.3 kg) (05/26 0059)   Physical Exam: General: NAD, pleasant, able to participate in exam Cardiac: RRR, normal heart sounds, no murmurs. 2+ radial and PT pulses bilaterally Respiratory: CTAB, normal effort, No wheezes, rales or rhonchi Abdomen: soft, nontender, nondistended, no hepatic or splenomegaly, +BS Extremities: no edema or cyanosis. WWP. Skin: warm and dry, no rashes noted Neuro: No visual deficit, EOM intact, visual field test normal, CNII-XII were intact, 5/5 strength upper and lower extremities, Sensation intact bilaterally UE and LE.  Neurologic Exam   Gait, Coordination, and Reflexes   Gait Gait: normal  Coordination  Romberg: negative Finger to nose coordination: normal Heel to shin coordination: normal Tandem walking coordination: normal  Tremor  Resting tremor: absent  Reflexes  Right patellar: 2+ Left patellar: 2+ Right plantar: normal Left plantar: normal  Psych: Normal affect and  mood  Laboratory:  Recent Labs Lab 10/22/16 1808 10/22/16 1827  WBC 6.3  --   HGB 14.3 13.3  HCT 40.9 39.0  PLT 183  --     Recent Labs Lab  10/22/16 1808 10/22/16 1827  NA 140 141  K 4.1 4.0  CL 109 107  CO2 24  --   BUN 15 18  CREATININE 1.05 1.10  CALCIUM 9.1  --   PROT 6.6  --   BILITOT 1.5*  --   ALKPHOS 67  --   ALT 20  --   AST 22  --   GLUCOSE 93 88   Lipid Panel     Component Value Date/Time   CHOL 236 (H) 10/23/2016 0221   TRIG 158 (H) 10/23/2016 0221   HDL 33 (L) 10/23/2016 0221   CHOLHDL 7.2 10/23/2016 0221   VLDL 32 10/23/2016 0221   LDLCALC 171 (H) 10/23/2016 0221    Imaging/Diagnostic Tests: Ct Head Wo Contrast  Result Date: 10/22/2016 CLINICAL DATA:  66 y/o M; blurred right-sided vision and difficulty speaking. EXAM: CT HEAD WITHOUT CONTRAST TECHNIQUE: Contiguous axial images were obtained from the base of the skull through the vertex without intravenous contrast. COMPARISON:  None. FINDINGS: Brain: No evidence of acute infarction, hemorrhage, hydrocephalus, extra-axial collection or mass lesion/mass effect. Vascular: No hyperdense vessel or unexpected calcification. Skull: Normal. Negative for fracture or focal lesion. Sinuses/Orbits: No acute finding. Other: None. IMPRESSION: No acute intracranial abnormality identified. Unremarkable CT of the head. Electronically Signed   By: Mitzi Hansen M.D.   On: 10/22/2016 18:28   Mr Brain Wo Contrast  Result Date: 10/23/2016 CLINICAL DATA:  Transient RIGHT lateral hemianopsia and aphasia, ear pain and mild dizziness. Evaluate transient ischemic attack. EXAM: MRI HEAD WITHOUT CONTRAST MRA HEAD WITHOUT CONTRAST TECHNIQUE: Multiplanar, multiecho pulse sequences of the brain and surrounding structures were obtained without intravenous contrast. Angiographic images of the head were obtained using MRA technique without contrast. COMPARISON:  CT HEAD Oct 22, 2016 FINDINGS: MRI HEAD FINDINGS BRAIN: No reduced diffusion to suggest acute ischemia. No susceptibility artifact to suggest hemorrhage. The ventricles and sulci are normal for patient's age. A few  punctate supratentorial white matter FLAIR T2 hyperintensities are less than expected for age, consistent with chronic small vessel ischemic disease. No suspicious parenchymal signal, masses or mass effect. No abnormal extra-axial fluid collections. VASCULAR: Normal major intracranial vascular flow voids present at skull base. SKULL AND UPPER CERVICAL SPINE: No abnormal sellar expansion. No suspicious calvarial bone marrow signal. Craniocervical junction maintained. SINUSES/ORBITS: The mastoid air-cells and included paranasal sinuses are well-aerated. The included ocular globes and orbital contents are non-suspicious. OTHER: None. MRA HEAD FINDINGS ANTERIOR CIRCULATION: Normal flow related enhancement of the included cervical, petrous, cavernous and supraclinoid internal carotid arteries. Patent anterior communicating artery. Normal flow related enhancement of the anterior and middle cerebral arteries, including distal segments. No large vessel occlusion, high-grade stenosis, abnormal luminal irregularity, aneurysm. POSTERIOR CIRCULATION: Codominant vertebral artery's. Basilar artery is patent, with normal flow related enhancement of the main branch vessels. Normal flow related enhancement of the posterior cerebral arteries. Robust LEFT in tiny RIGHT posterior communicating arteries present. No large vessel occlusion, high-grade stenosis, abnormal luminal irregularity, aneurysm. ANATOMIC VARIANTS: None. Source images and MIP images were reviewed. IMPRESSION: Normal noncontrast MRI head. Normal MRA head. Electronically Signed   By: Awilda Metro M.D.   On: 10/23/2016 04:13   Mr Maxine Glenn Head/brain RU Cm  Result Date: 10/23/2016 CLINICAL DATA:  Transient RIGHT lateral  hemianopsia and aphasia, ear pain and mild dizziness. Evaluate transient ischemic attack. EXAM: MRI HEAD WITHOUT CONTRAST MRA HEAD WITHOUT CONTRAST TECHNIQUE: Multiplanar, multiecho pulse sequences of the brain and surrounding structures were obtained  without intravenous contrast. Angiographic images of the head were obtained using MRA technique without contrast. COMPARISON:  CT HEAD Oct 22, 2016 FINDINGS: MRI HEAD FINDINGS BRAIN: No reduced diffusion to suggest acute ischemia. No susceptibility artifact to suggest hemorrhage. The ventricles and sulci are normal for patient's age. A few punctate supratentorial white matter FLAIR T2 hyperintensities are less than expected for age, consistent with chronic small vessel ischemic disease. No suspicious parenchymal signal, masses or mass effect. No abnormal extra-axial fluid collections. VASCULAR: Normal major intracranial vascular flow voids present at skull base. SKULL AND UPPER CERVICAL SPINE: No abnormal sellar expansion. No suspicious calvarial bone marrow signal. Craniocervical junction maintained. SINUSES/ORBITS: The mastoid air-cells and included paranasal sinuses are well-aerated. The included ocular globes and orbital contents are non-suspicious. OTHER: None. MRA HEAD FINDINGS ANTERIOR CIRCULATION: Normal flow related enhancement of the included cervical, petrous, cavernous and supraclinoid internal carotid arteries. Patent anterior communicating artery. Normal flow related enhancement of the anterior and middle cerebral arteries, including distal segments. No large vessel occlusion, high-grade stenosis, abnormal luminal irregularity, aneurysm. POSTERIOR CIRCULATION: Codominant vertebral artery's. Basilar artery is patent, with normal flow related enhancement of the main branch vessels. Normal flow related enhancement of the posterior cerebral arteries. Robust LEFT in tiny RIGHT posterior communicating arteries present. No large vessel occlusion, high-grade stenosis, abnormal luminal irregularity, aneurysm. ANATOMIC VARIANTS: None. Source images and MIP images were reviewed. IMPRESSION: Normal noncontrast MRI head. Normal MRA head. Electronically Signed   By: Awilda Metroourtnay  Bloomer M.D.   On: 10/23/2016 04:13      Lovena Neighboursiallo, Wilman Tucker, MD 10/23/2016, 6:52 AM PGY-1, [facility] FPTS Intern pager: [phone], text pages welcome

## 2016-10-23 NOTE — ED Notes (Signed)
Admitting Md request NPO be discontinued; pt to have swallow screen and can eat/drink upon passing.

## 2016-10-24 ENCOUNTER — Observation Stay (HOSPITAL_BASED_OUTPATIENT_CLINIC_OR_DEPARTMENT_OTHER): Payer: Medicare Other

## 2016-10-24 DIAGNOSIS — G459 Transient cerebral ischemic attack, unspecified: Secondary | ICD-10-CM | POA: Diagnosis not present

## 2016-10-24 DIAGNOSIS — H539 Unspecified visual disturbance: Secondary | ICD-10-CM | POA: Diagnosis not present

## 2016-10-24 DIAGNOSIS — N401 Enlarged prostate with lower urinary tract symptoms: Secondary | ICD-10-CM | POA: Diagnosis not present

## 2016-10-24 DIAGNOSIS — G458 Other transient cerebral ischemic attacks and related syndromes: Secondary | ICD-10-CM | POA: Diagnosis not present

## 2016-10-24 DIAGNOSIS — R4701 Aphasia: Secondary | ICD-10-CM | POA: Diagnosis not present

## 2016-10-24 DIAGNOSIS — I360 Nonrheumatic tricuspid (valve) stenosis: Secondary | ICD-10-CM

## 2016-10-24 LAB — ECHOCARDIOGRAM COMPLETE
Height: 67 in
Weight: 3184 oz

## 2016-10-24 LAB — HEMOGLOBIN A1C
Hgb A1c MFr Bld: 5.1 % (ref 4.8–5.6)
Mean Plasma Glucose: 100 mg/dL

## 2016-10-24 MED ORDER — ATORVASTATIN CALCIUM 40 MG PO TABS: 40.0000 mg | ORAL_TABLET | Freq: Every day | ORAL | 1 refills | Status: DC

## 2016-10-24 MED ORDER — ASPIRIN 81 MG PO TBEC: 81.0000 mg | DELAYED_RELEASE_TABLET | Freq: Every day | ORAL | 1 refills | Status: DC

## 2016-10-24 NOTE — Progress Notes (Signed)
Family Medicine Teaching Service Daily Progress Note Intern Pager: [phone]  Patient name: Edwin Dickerson Medical record number: [id number] Date of birth: 08/04/1950 Age: 66 y.o. Gender: male  Primary Care Provider: System, Pcp Not In Consultants: Neurology Code Status: Full Code  Pt Overview and Major Events to Date:  Admitted on 5/25 Seen by Neurology 5/26  Assessment and Plan: Edwin Richesdward Pagliaro is a 66 y.o. male with a past medical history significant for BPH presenting with right eye lateral hemianopsia and expressive aphasia concerning for TIA.  #Right Hemianopsia/expressive aphasia/headache, acute, resolved:  Normal MRI and MRA. ABCD-2 score 4, moderate risk 2-Day Stroke Risk: 4.1%, 7-Day Stroke Risk: 5.9%, 90-Day Stroke Risk: 9.8%. A1c and TFT within normal limits. Lipid panel with LDL to 171.  --Appreciate neuro recommendations --Follow up on Carotid Dopplers and ECHO --ASA 81 mg daily --Continue Lipitor 40 mg daily  --We'll discharge after echo and Doppler  #Elevated BP, acute: Normotensive without intervention. Elevated BP could have been secondary to TIA event as well as reported global HA. --Will continue to monitor  #BPH, chronic: On Cialis daily. Denies urinary symptoms today. -Tinea on Cialis  FEN/GI: Heart healthy Diet   PPx: SCDs  Disposition: Anticipate discharge after doppler and echocardiogram  Subjective:  No complaints this morning. No more neuro symptoms. Ready to go home  Objective: Temp:  [97.5 F (36.4 C)-98.1 F (36.7 C)] 97.8 F (36.6 C) (05/27 0856) Pulse Rate:  [46-63] 63 (05/27 0856) Resp:  [16-18] 18 (05/27 0856) BP: (109-121)/(50-74) 113/69 (05/27 0856) SpO2:  [97 %-99 %] 97 % (05/27 0856)   Physical Exam: GEN: appears well, no apparent distress. Head: normocephalic and atraumatic  Eyes: conjunctiva without injection, sclera anicteric Oropharynx: mmm without erythema or exudation. Poor dentition CVS: RRR, nl s1 & s2, no murmurs, no  edema RESP: no IWOB, good air movement bilaterally, CTAB GI: BS present & normal, soft, NTND MSK: no focal tenderness or notable swelling SKIN: no apparent skin lesion NEURO: Awake, alert and oiented appropriately, CN II-12 within normal limits. Motor 5/5 in all extremities. Light sensation intact. Negative pronator drift. Finger-to-nose within normal limits. PSYCH: euthymic mood with congruent affect  Laboratory:  Recent Labs Lab 10/22/16 1808 10/22/16 1827  WBC 6.3  --   HGB 14.3 13.3  HCT 40.9 39.0  PLT 183  --     Recent Labs Lab 10/22/16 1808 10/22/16 1827  NA 140 141  K 4.1 4.0  CL 109 107  CO2 24  --   BUN 15 18  CREATININE 1.05 1.10  CALCIUM 9.1  --   PROT 6.6  --   BILITOT 1.5*  --   ALKPHOS 67  --   ALT 20  --   AST 22  --   GLUCOSE 93 88   Lipid Panel     Component Value Date/Time   CHOL 236 (H) 10/23/2016 0221   TRIG 158 (H) 10/23/2016 0221   HDL 33 (L) 10/23/2016 0221   CHOLHDL 7.2 10/23/2016 0221   VLDL 32 10/23/2016 0221   LDLCALC 171 (H) 10/23/2016 0221    Imaging/Diagnostic Tests: No results found.   Almon HerculesGonfa, Elda Dunkerson T, MD 10/24/2016, 11:38 AM PGY-2, [facility] FPTS Intern pager: [phone], text pages welcome

## 2016-10-24 NOTE — Progress Notes (Signed)
VASCULAR LAB PRELIMINARY  PRELIMINARY  PRELIMINARY  PRELIMINARY  Carotid duplex completed.    Preliminary report:  1-39% ICA plaquing. Vertebral artery flow is antegrade.   Tekeya Geffert, RVT 10/24/2016, 2:22 PM

## 2016-10-24 NOTE — Progress Notes (Signed)
STROKE TEAM PROGRESS NOTE   HISTORY OF PRESENT ILLNESS (per record) Edwin Dickerson is an 66 y.o. male who presented to the [facility] ED with an episode of hemianopic blurry vision in the right eye (temporal visual field) and expressive aphasia. Symptom onset was at about 1 PM on Friday, initially with the vision complaint, followed about 45 minutes with onset of expressive aphasia. He "could not get the words out" for items in his home such as a balloon. The speech deficit lasted for about 15 minutes. There was no associated weakness or sensory numbness. In the ED, speech was clear and vision complaint had resolved.    SUBJECTIVE (INTERVAL HISTORY) His wife is at bedside. His symptoms have resolved and he feels good.   OBJECTIVE Temp:  [97.5 F (36.4 C)-98.1 F (36.7 C)] 97.5 F (36.4 C) (05/27 0445) Pulse Rate:  [46-62] 53 (05/27 0445) Cardiac Rhythm: Sinus bradycardia;Bundle branch block (05/26 1900) Resp:  [16-18] 18 (05/27 0445) BP: (107-121)/(50-75) 118/64 (05/27 0445) SpO2:  [96 %-100 %] 97 % (05/27 0445)  CBC:   Recent Labs Lab 10/22/16 1808 10/22/16 1827  WBC 6.3  --   NEUTROABS 4.5  --   HGB 14.3 13.3  HCT 40.9 39.0  MCV 85.7  --   PLT 183  --     Basic Metabolic Panel:   Recent Labs Lab 10/22/16 1808 10/22/16 1827  NA 140 141  K 4.1 4.0  CL 109 107  CO2 24  --   GLUCOSE 93 88  BUN 15 18  CREATININE 1.05 1.10  CALCIUM 9.1  --     Lipid Panel:     Component Value Date/Time   CHOL 236 (H) 10/23/2016 0221   TRIG 158 (H) 10/23/2016 0221   HDL 33 (L) 10/23/2016 0221   CHOLHDL 7.2 10/23/2016 0221   VLDL 32 10/23/2016 0221   LDLCALC 171 (H) 10/23/2016 0221   HgbA1c: No results found for: HGBA1C Urine Drug Screen: No results found for: LABOPIA, COCAINSCRNUR, LABBENZ, AMPHETMU, THCU, LABBARB  Alcohol Level No results found for: ETH  IMAGING  Ct Head Wo Contrast 10/22/2016 No acute intracranial abnormality identified. Unremarkable CT of the head.     Mr  Brain Wo Contrast 10/23/2016 Normal noncontrast MRI head. Normal MRA head.     Mr Maxine Glenn Head/brain Wo Cm 10/23/2016 Normal noncontrast MRI head.  Normal MRA head.    PHYSICAL EXAM   Physical exam: Exam: Gen: NAD, conversant, well nourised, obese, well groomed                     CV: RRR, no MRG. No Carotid Bruits. No peripheral edema, warm, nontender Eyes: Conjunctivae clear without exudates or hemorrhage  Neuro: Detailed Neurologic Exam  Speech:    Speech is normal; fluent and spontaneous with normal comprehension.  Cognition:    The patient is oriented to person, place, and time;     recent and remote memory intact;     language fluent;     normal attention, concentration, fund of knowledge Cranial Nerves:    The pupils are equal, round, and reactive to light. Visual fields are full to finger confrontation. Extraocular movements are intact. Trigeminal sensation is intact and the muscles of mastication are normal. The face is symmetric. The palate elevates in the midline. Hearing intact. Voice is normal. Shoulder shrug is normal. The tongue has normal motion without fasciculations.   Coordination:    No dysmetria  Motor Observation:    No  asymmetry, no atrophy, and no involuntary movements noted. Tone:    Normal muscle tone.    Posture:    Posture is normal. normal erect    Strength:    Strength is V/V in the upper and lower limbs.      Sensation: intact to LT     Reflex Exam:  DTR's:    Deep tendon reflexes in the upper and lower extremities are symmetrical bilaterally.       ASSESSMENT/PLAN [name] is a 66 y.o. male with no significant past medical history presenting with expressive aphasia and visual changes. He did not receive IV t-PA due to resolution of deficits.  TIA:    Resultant  Resolution of symptoms  CT head - No acute intracranial abnormality identified  MRI head - normal  MRA head - normal  Carotid Doppler - pending  2D  Echo - pending  LDL - 171  HgbA1c - 5.1  VTE prophylaxis - SCDs Diet Heart Room service appropriate? Yes; Fluid consistency: Thin  No antithrombotic prior to admission, now on aspirin 81 mg daily. Prefer ASA 325mg , literature is mixed but 325mg  may be more protective than 81mg .   Patient counseled to be compliant with his antithrombotic medications  Ongoing aggressive stroke risk factor management  Therapy recommendations: no therapies ordered - deficits resolved.  Disposition:  Pending  Hypertension  Stable - mildly low at times  Permissive hypertension (OK if < 220/120) but gradually normalize in 5-7 days  Long-term BP goal normotensive  Hyperlipidemia  Home meds:  No lipid lowering medications prior to admission   LDL 171, goal < 70  Now on Lipitor 40 mg daily  Continue statin at discharge    Other Stroke Risk Factors  Advanced age  Obesity, Body mass index is 31.17 kg/m., recommend weight loss, diet and exercise as appropriate    Other Active Problems    Hospital day # 0   Personally examined patient and images, and have participated in and made any corrections needed to history, physical, neuro exam,assessment and plan as stated above.  I have personally obtained the history, evaluated lab date, reviewed imaging studies and agree with radiology interpretations.   Stroke will sign off at this time.     To contact Stroke Continuity provider, please refer to [email or web address]. After hours, contact General Neurology

## 2016-10-24 NOTE — Progress Notes (Signed)
  Echocardiogram 2D Echocardiogram has been performed.  Edwin Dickerson, Edwin Dickerson 10/24/2016, 1:31 PM

## 2016-10-24 NOTE — Discharge Summary (Signed)
Family Medicine Teaching [facility] Discharge Summary  Patient name: Edwin Dickerson Medical record number: [id number] Date of birth: [birth year] Age: 66 y.o. Gender: male Date of Admission: 10/22/2016  Date of Discharge: 10/25/16 Admitting Physician: Uvaldo Rising, MD  Primary Care Provider: System, Pcp Not In Consultants: Neurology  Indication for Hospitalization: TIA (blurry vision and aphasia)  Discharge Diagnoses/Problem List:  TIA Hyperlipidemia BPH  Disposition: Home  Discharge Condition: Stable  Discharge Exam:  GEN: appears well, no apparent distress. Head: normocephalic and atraumatic  Eyes: conjunctiva without injection, sclera anicteric Oropharynx: mmm without erythema or exudation. Poor dentition CVS: RRR, nl s1 & s2, no murmurs, no edema RESP: no IWOB, good air movement bilaterally, CTAB GI: BS present & normal, soft, NTND MSK: no focal tenderness or notable swelling SKIN: no apparent skin lesion NEURO: Awake, alert and oiented appropriately, CN II-12 within normal limits. Motor 5/5 in all extremities. Light sensation intact. Negative pronator drift. Finger-to-nose within normal limits. PSYCH: euthymic mood with congruent affect  Brief Hospital Course:  Edwin Dickerson is a 66 year old male with history of BPH who was admitted due to blurry vision and slurred speech for about 10-15 minutes that has resolved on arrival to ED. He remained asymptomatic for over 24 hours from neuro standpoint.  His workup including CT head and MRI/MRA were negative for acute intracranial process. A1c 5.1. TSH mildly elevated but free T4 within normal limits.  Lipid panel with LDL to 171.  Echocardiogram with EF of 60-65% and G1DD. Carotid Doppler was done on the day of discharge but have not resulted.  Patient has ABCD-2 score of 4, that puts him at moderate risk for stroke, with 2-Day Stroke Risk: 4.1%, 7-Day Stroke Risk: 5.9%, 90-Day Stroke Risk: 9.8%. Patient was discharged on Lipitor  40 mg daily and aspirin 81 mg daily.   Issues for Follow Up:  1. TIA: Check blood pressure at follow-up. Ensure that he takes his aspirin and Lipitor 2. Carotid Doppler pending at time of discharge  Significant Procedures: None  Significant Labs and Imaging:   Recent Labs Lab 10/22/16 1808 10/22/16 1827  WBC 6.3  --   HGB 14.3 13.3  HCT 40.9 39.0  PLT 183  --     Recent Labs Lab 10/22/16 1808 10/22/16 1827  NA 140 141  K 4.1 4.0  CL 109 107  CO2 24  --   GLUCOSE 93 88  BUN 15 18  CREATININE 1.05 1.10  CALCIUM 9.1  --   ALKPHOS 67  --   AST 22  --   ALT 20  --   ALBUMIN 4.3  --    A1c 5.1 Cholesterol 236, triglyceride 158, HDL 33, LDL 171 TSH 5.76, free T4 1.32  Results/Tests Pending at Time of Discharge:  Discharge Medications:  Allergies as of 10/24/2016      Reactions   Penicillins Hives, Rash, Other (See Comments)   "blacked out" Has patient had a PCN reaction causing immediate rash, facial/tongue/throat swelling, SOB or lightheadedness with hypotension: Yes Has patient had a PCN reaction causing severe rash involving mucus membranes or skin necrosis: Yes Has patient had a PCN reaction that required hospitalization: No Has patient had a PCN reaction occurring within the last 10 years: No If all of the above answers are "NO", then may proceed with Cephalosporin use.      Medication List    STOP taking these medications   ibuprofen 200 MG tablet Commonly known as:  ADVIL,MOTRIN  TAKE these medications   ALLERGY RELIEF PO Take 1 tablet by mouth daily as needed (allergies).   aspirin 81 MG EC tablet Take 1 tablet (81 mg total) by mouth daily.   atorvastatin 40 MG tablet Commonly known as:  LIPITOR Take 1 tablet (40 mg total) by mouth daily at 6 PM.   OVER THE COUNTER MEDICATION Place 1 spray into both nostrils daily as needed (allergies).   tadalafil 5 MG tablet Commonly known as:  CIALIS Take 5 mg by mouth daily.       Discharge  Instructions: Please refer to Patient Instructions section of EMR for full details.  Patient was counseled important signs and symptoms that should prompt return to medical care, changes in medications, dietary instructions, activity restrictions, and follow up appointments.   Follow-Up Appointments: Follow-up Information    Almon HerculesGonfa, Adrienne Delay T, MD Follow up on 11/01/2016.   Specialty:  Family Medicine Why:  Hospital follow up at 9:30 am Contact information: [address] [place] [address] [phone]           Almon HerculesGonfa, Suleyma Wafer T, MD 10/25/2016, 12:05 AM PGY-2, [facility]

## 2016-10-24 NOTE — Progress Notes (Signed)
Pt discharged at this time with wife taking all personal belongings. IV discontinued, dry dressing applied. Discharge instructions provided with verbal understanding. Pt will follow up with PCP per Md summary. No noted distress.

## 2016-10-24 NOTE — Discharge Instructions (Addendum)
It has been a pleasure taking care of you! You were admitted due to brief episode of vision changes and difficulty speaking, which could be a warning sign for stroke. Luckily, your brain MRI didn't show stroke. Your symptoms have resolved as well. At this point, we think it is safe to let you go home and follow up with your primary care doctor. We are discharging you on cholesterol medicine and aspirin to lower your risk of stroke. There could be some changes made to your home medications during this hospitalization. Please, make sure to read the directions before you take them. The names and directions on how to take these medications are found on this discharge paper under medication section.  The results of the ultrasound of  the blood vessels in your neck are not ready yet. Someone will contact you if there was significant findings. Otherwise, the reports will be sent to your primary care doctor.   Please follow up at your primary care doctor's office. The address, date and time are found on the discharge paper under follow up section.  Once you are discharged, your primary care physician will handle any further medical issues. Please note that NO REFILLS for any discharge medications will be authorized once you are discharged, as it is imperative that you return to your primary care physician (or establish a relationship with a primary care physician if you do not have one) for your aftercare needs so that they can reassess your need for medications and monitor your lab values. Take care,

## 2016-10-25 LAB — VAS US CAROTID
LCCADSYS: -91 cm/s
LCCAPDIAS: 29 cm/s
LEFT ECA DIAS: -12 cm/s
LEFT VERTEBRAL DIAS: -15 cm/s
Left CCA dist dias: -27 cm/s
Left CCA prox sys: 107 cm/s
Left ICA dist dias: -26 cm/s
Left ICA dist sys: -67 cm/s
Left ICA prox dias: -32 cm/s
Left ICA prox sys: -96 cm/s
RCCADSYS: -44 cm/s
RCCAPDIAS: 21 cm/s
RCCAPSYS: 103 cm/s
RIGHT ECA DIAS: -15 cm/s
RIGHT VERTEBRAL DIAS: -16 cm/s

## 2016-10-26 ENCOUNTER — Telehealth: Payer: Self-pay | Admitting: Family Medicine

## 2016-10-26 NOTE — Telephone Encounter (Signed)
Called patient and made aware that Carotid US negative.

## 2016-11-01 ENCOUNTER — Ambulatory Visit (INDEPENDENT_AMBULATORY_CARE_PROVIDER_SITE_OTHER): Payer: Medicare Other | Admitting: Student

## 2016-11-01 ENCOUNTER — Encounter: Payer: Self-pay | Admitting: Student

## 2016-11-01 VITALS — BP 102/70 | HR 61 | Temp 97.9°F | Ht 67.0 in | Wt 198.2 lb

## 2016-11-01 DIAGNOSIS — G458 Other transient cerebral ischemic attacks and related syndromes: Secondary | ICD-10-CM

## 2016-11-01 DIAGNOSIS — G459 Transient cerebral ischemic attack, unspecified: Secondary | ICD-10-CM

## 2016-11-01 MED ORDER — ASPIRIN 81 MG PO TBEC: 81.0000 mg | DELAYED_RELEASE_TABLET | Freq: Every day | ORAL | 3 refills | Status: DC

## 2016-11-01 MED ORDER — ATORVASTATIN CALCIUM 40 MG PO TABS: 40.0000 mg | ORAL_TABLET | Freq: Every day | ORAL | 3 refills | Status: DC

## 2016-11-01 NOTE — Patient Instructions (Addendum)
It was great seeing you today! We have addressed the following issues today 1. Transient ischemic attack: Continue taking the atorvastatin and aspirin. I have renewed these prescriptions for you. I also recommend lifestyle changes including diet and exercise. See below for more on this.    Please come back and see Edwin Dickerson in a month for your wellness visit  If we did any lab work today, and the results require attention, either me or my nurse will get in touch with you. If everything is normal, you will get a letter in mail and a message via . If you don't hear from Edwin Dickerson in two weeks, please give Edwin Dickerson a call. Otherwise, we look forward to seeing you again at your next visit. If you have any questions or concerns before then, please call the clinic at [phone].  Please bring all your medications to every doctors visit  Sign up for My Chart to have easy access to your labs results, and communication with your Primary care physician.    Please check-out at the front desk before leaving the clinic.    Take Care,   Dr. Alanda SlimGonfa  Portion Size    Choose healthier foods such as 100% whole grains, vegetables, fruits, beans, nut seeds, olive oil, most vegetable oils, fat-free dietary, wild game and fish.   Avoid sweet tea, other sweetened beverages, soda, fruit juice, cold cereal and milk and trans fat.   Eat at least 3 meals and 1-2 snacks per day.  Aim for no more than 5 hours between eating.  Eat breakfast within one hour of getting up.    Exercise at least 150 minutes per week, including weight resistance exercises 3 or 4 times per week.   Try to lose at least 7-10% of your current body weight.   Exercise Guide:

## 2016-11-01 NOTE — Assessment & Plan Note (Addendum)
Resolved. CVA workup was basically unremarkable except for elevated LDL to 171. No further neuro symptoms. Good compliance with his medications. Exam within normal limits today. We'll continue aspirin 81 mg and atorvastatin 40 mg daily. Discussed about lifestyle changes including diet and exercise. Gave her handout on this. Follow-up in 1 month for his annual physical.

## 2016-11-01 NOTE — Progress Notes (Signed)
  Subjective:    Edwin Dickerson is a 66 y.o. old male here for hospital follow-up and TIA  HPI TIA: Patient was recently admitted for TIA. CVA workup including CT head, MRI, carotid Doppler and echocardiogram were basically negative. A1c was 5.1. PFT was within normal limits. LDL was elevated to 171. He was discharged on aspirin 81 mg and atorvastatin 40 mg daily. He says he has been taking aspirin 325 mg. Reports good compliance with medications. He denies problems taking his medications. He denies neuro symptoms. He does not smoke. He works on the farm Therapist, sportschicken. He says he walks a lot.   PMH/Problem List: has Blurred vision, bilateral; Slurred speech; Transient cerebral ischemia; TIA (transient ischemic attack); Vision changes; Word finding difficulty; Elevated blood pressure reading; Benign prostatic hyperplasia with lower urinary tract symptoms; and Chronic nonintractable headache on his problem list.   has no past medical history on file.  FH:  No family history on file.  SH Social History  Substance Use Topics  . Smoking status: Never Smoker  . Smokeless tobacco: Never Used  . Alcohol use No    Review of Systems Review of systems negative except for pertinent positives and negatives in history of present illness above.     Objective:     Vitals:   11/01/16 0937  BP: 102/70  Pulse: 61  Temp: 97.9 F (36.6 C)  TempSrc: Oral  SpO2: 97%  Weight: 198 lb 3.2 oz (89.9 kg)  Height: 5\' 7"  (1.702 m)    Physical Exam GEN: appears well, no apparent distress. CVS: RRR, nl S1&S2, no murmurs, no edema RESP: no IWOB, good air movement bilaterally, CTAB GI: BS present & normal, soft, NTND MSK: no focal tenderness or notable swelling SKIN: no apparent skin lesion NEURO: alert and oiented appropriately, CNs 2-12 intact, normal speech, motor 5/5 in upper and lower extremities, less sensation intact PSYCH: euthymic mood with congruent affect    Assessment and Plan:  TIA (transient  ischemic attack) Resolved. CVA workup was basically unremarkable except for elevated LDL to 171. No further neuro symptoms. Good compliance with his medications. Exam within normal limits today. We'll continue aspirin 81 mg and atorvastatin 40 mg daily. Discussed about lifestyle changes including diet and exercise. Gave her handout on this. Follow-up in 1 month for his annual physical.  Return in about 1 month (around 12/01/2016) for Annual physical.  Almon Herculesaye T Gonfa, MD 11/01/16 Pager: [phone]

## 2016-12-06 ENCOUNTER — Encounter: Payer: Self-pay | Admitting: Student

## 2016-12-06 ENCOUNTER — Ambulatory Visit (INDEPENDENT_AMBULATORY_CARE_PROVIDER_SITE_OTHER): Payer: Medicare Other | Admitting: Student

## 2016-12-06 DIAGNOSIS — Z0000 Encounter for general adult medical examination without abnormal findings: Secondary | ICD-10-CM

## 2016-12-06 MED ORDER — ZOSTER VACCINE LIVE 19400 UNT/0.65ML ~~LOC~~ SUSR: 0.6500 mL | Freq: Once | SUBCUTANEOUS | 0 refills | Status: AC

## 2016-12-06 NOTE — Patient Instructions (Addendum)
It was great seeing you today! We have addressed the following issues today 1. Staying healthy: please refer to some of our recommendations about lifestyle (diet and exercise). We also recommend regular follow-up with the dentist and eye doctors.  2.   Colon cancer screening: please call one of the numbers below to schedule a colonoscopy  3   Shingles vaccine: please take the prescription were given to the pharmacy to have the shingles vaccination  If we did any lab work today, and the results require attention, either me or my nurse will get in touch with you. If everything is normal, you will get a letter in mail and a message via . If you don't hear from [place] in two weeks, please give [place] a call. Otherwise, we look forward to seeing you again at your next visit. If you have any questions or concerns before then, please call the clinic at [phone].  Please bring all your medications to every doctors visit  Sign up for My Chart to have easy access to your labs results, and communication with your Primary care physician.    Please check-out at the front desk before leaving the clinic.    Take Care,   Dr. Alanda Slim  Portion Size    Choose healthier foods such as 100% whole grains, vegetables, fruits, beans, nut seeds, olive oil, most vegetable oils, fat-free dietary, wild game and fish.   Avoid sweet tea, other sweetened beverages, soda, fruit juice, cold cereal and milk and trans fat.   Eat at least 3 meals and 1-2 snacks per day.  Aim for no more than 5 hours between eating.  Eat breakfast within one hour of getting up.    Exercise at least 150 minutes per week, including weight resistance exercises 3 or 4 times per week.   Try to lose at least 7-10% of your current body weight.            Colon Cancer  People with early colon cancer usually have no warning signs or symptoms.  If found early, most patients can be cured, but if found when it has already spread, the chance of  survival is not as good.  Colon cancer is the second most common cause of concern is in the [place] with over 56,000 deaths from colon cancer in 2005  Colon cancer is a common, treatable disease. Screening tests can find a cancer  early, before you have symptoms, and make it more likely that you will survive the disease.  Who needs to be tested? If you are age 63-75 yrs, you should be tested for colon cancer.  Ways to be tested:  A colonoscopy the best test to detect colon cancer. It requires you to drink a bowel preparation to clean out your colon before the test. During this test, a tube with a camera inserted into your rectum and examines your entire colon. You can be given medicine to make you sleepy during the exam. Therefore, you will not be able to drive immediately after the test. There is a small risk of bowel injury during the test.   Stool cards that you can take home and take a sample of your stool is another option. The cards are not as good as colonoscopy at detecting cancer, but the tests are easier and cheaper.   To schedule the colonoscopy, you can call one of the 3 options below:  [facility]. Phone number: [phone]  [facility]. Phone number: [phone]  [facility]:  Phone number [phone]

## 2016-12-06 NOTE — Progress Notes (Signed)
Subjective:     Ramon Dredgedward is a 66 y.o. old male here for annual physical  HPI Annual physical: patient has no concern today. He reports walking daily for 20-30 minutes. However, he reports eating more recently. He says he gained two pounds in the last one month.   PMH/Problem List: has Blurred vision, bilateral; Slurred speech; Transient cerebral ischemia; TIA (transient ischemic attack); Vision changes; Word finding difficulty; Elevated blood pressure reading; Benign prostatic hyperplasia with lower urinary tract symptoms; Chronic nonintractable headache; and Routine adult health maintenance on his problem list.   has no past medical history on file.  [facility]  No family history on file.  Additional:  Heart Disease before age of 66 yrs: Cancer:   SH Social History  Substance Use Topics  . Smoking status: Never Smoker  . Smokeless tobacco: Never Used  . 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  Sleep Apnea Screening: Negative Skin Cancer Screening:  Denies skin lesion Opthalmology Visit: 1.5 years ago Dental Visit: <6 months  Immunizations  Zoster vaccine: 3762yrs & above  Review of Systems  Constitutional: Negative for appetite change, fatigue and fever.  HENT: Negative for trouble swallowing.   Eyes: Negative for visual disturbance.  Respiratory: Negative for cough, chest tightness and shortness of breath.   Cardiovascular: Negative for chest pain and leg swelling.  Gastrointestinal: Negative for abdominal distention and blood in stool.  Endocrine: Negative for polydipsia and polyuria.  Genitourinary: Negative for dysuria and hematuria.  Musculoskeletal: Negative for arthralgias and myalgias.  Skin: Negative for  rash.  Neurological: Negative for dizziness and light-headedness.  Hematological: Negative for adenopathy. Does not bruise/bleed easily.  Psychiatric/Behavioral: Negative for dysphoric mood and sleep disturbance.       Objective:   Physical Exam Vitals:   12/06/16 1544  BP: 100/60  Pulse: 72  Temp: 98.3 F (36.8 C)  TempSrc: Oral  SpO2: 92%  Weight: 200 lb (90.7 kg)  Height: 5\' 7"  (1.702 m)   Body mass index is 31.32 kg/m.  GEN: appears well, no apparent distress. Head: normocephalic and atraumatic  Eyes: conjunctiva without injection, sclera anicteric Ears: external ear and ear canal normal Nares: no rhinorrhea, congestion or erythema Oropharynx: mmm without erythema or exudation, a lot of dental fillings HEM: negative for cervical or periauricular lymphadenopathies CVS: RRR, nl s1 & s2, no murmurs, no edema,  2+ DP & PT pulses bilaterally RESP: no IWOB, good air movement bilaterally, CTAB GI: BS present & normal, soft, NTND, no guarding, no rebound, no mass MSK: no focal tenderness or notable swelling SKIN: no apparent skin lesion, no AK's ENDO: negative thyromegally NEURO: alert and oiented appropriately, no gross defecits  PSYCH: euthymic mood with congruent affect    Assessment & Plan:  Routine adult health maintenance Revisited about diet and exercise again today. Gave him handout. He is very motivated.  Discussed about the importance of colonoscopy. Gave him phone numbers to call and schedule this.  Gave him Rx for Zostavax Will screen for Hep C next blood draw. He has no significant risk except age.  Chronic conditions very stable. He reports good compliance with his medications. Review of system reassuring.  Recommended regular dental and eye visit.    Meds ordered this encounter  Medications  .  Zoster Vaccine Live, PF, (ZOSTAVAX) 16109 UNT/0.65ML injection    Sig: Inject 19,400 Units into the skin once.    Dispense:  0.65 mL    Refill:  0   Candelaria Stagers, MD.  PGY-3, [facility] Pager [phone] 12/07/16  7:49 PM

## 2016-12-07 DIAGNOSIS — Z0000 Encounter for general adult medical examination without abnormal findings: Secondary | ICD-10-CM | POA: Insufficient documentation

## 2016-12-07 NOTE — Assessment & Plan Note (Addendum)
Revisited about diet and exercise again today. Gave him handout. He is very motivated.  Discussed about the importance of colonoscopy. Gave him phone numbers to call and schedule this.  Gave him Rx for Zostavax Will screen for Hep C next blood draw. He has no significant risk except age.  Chronic conditions very stable. He reports good compliance with his medications. Review of system reassuring.  Recommended regular dental and eye visit.

## 2020-06-06 ENCOUNTER — Other Ambulatory Visit: Payer: Self-pay | Admitting: Family Medicine

## 2020-06-06 DIAGNOSIS — R972 Elevated prostate specific antigen [PSA]: Secondary | ICD-10-CM

## 2020-06-28 ENCOUNTER — Other Ambulatory Visit: Payer: Medicare Other

## 2020-07-15 ENCOUNTER — Ambulatory Visit
Admission: RE | Admit: 2020-07-15 | Discharge: 2020-07-15 | Disposition: A | Discharge status: Home / Self Care | Payer: Medicare Other | Source: Ambulatory Visit | Attending: Family Medicine | Admitting: Family Medicine

## 2020-07-15 DIAGNOSIS — R972 Elevated prostate specific antigen [PSA]: Secondary | ICD-10-CM

## 2020-07-15 MED ORDER — GADOBENATE DIMEGLUMINE 529 MG/ML IV SOLN
19.0000 mL | Freq: Once | INTRAVENOUS | Status: AC | PRN
  Administered 2020-07-15: 19 mL via INTRAVENOUS

## 2021-05-18 ENCOUNTER — Other Ambulatory Visit (HOSPITAL_COMMUNITY): Payer: Self-pay | Admitting: Orthopedic Surgery

## 2021-05-18 DIAGNOSIS — M79605 Pain in left leg: Secondary | ICD-10-CM

## 2021-05-19 ENCOUNTER — Ambulatory Visit (HOSPITAL_COMMUNITY): Payer: Medicare Other

## 2022-12-09 IMAGING — MR MR PROSTATE WO/W CM
12 series · 48 of 48 positions shown · IV contrast (Multihance 19ML)
Comparison: None.

CLINICAL DATA: Elevated PSA equal 5.7.  69-year-old male

EXAM:
MR PROSTATE WITHOUT AND WITH CONTRAST
TECHNIQUE: Multiplanar multisequence MRI images were obtained of the pelvis
centered about the prostate. Pre and post contrast images were
obtained.
CONTRAST:  19mL MULTIHANCE GADOBENATE DIMEGLUMINE 529 MG/ML IV SOLN

[Series 3: T2 · coronal · 3.0mm · 0.56mm/px · 1 of 23 slices shown (1 of 3)]
[im 1/23]
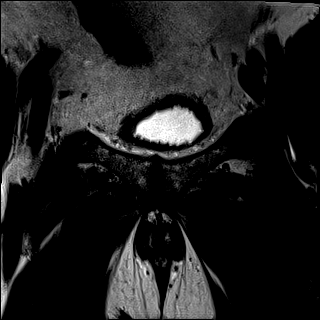

[Series 4: T1 · axial · 5.0mm · 1.25mm/px · 1 of 80 slices shown]
[im 1/80]
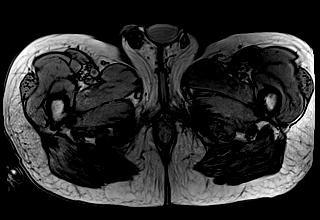

[Series 5: DWI · axial · 3.0mm · 1.75mm/px · z∈[-43,+20]mm · 2 of 66 slices shown (1 of 3)]
[im 1/66]
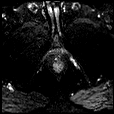
[im 66/66]
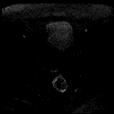

[Series 6: DWI · axial · 3.0mm · 1.75mm/px · 1 of 22 slices shown (2 of 3)]
[im 1/22]
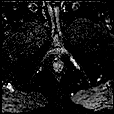

[Series 7: DWI · axial · 3.0mm · 1.75mm/px · 1 of 22 slices shown (3 of 3)]
[im 1/22]
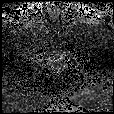

[Series 8: T2 · axial · 3.0mm · 0.56mm/px · 1 of 23 slices shown (2 of 3)]
[im 1/23]
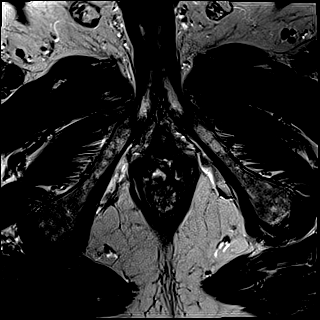

[Series 9: T2 · axial · 1.0mm · 1.04mm/px · z∈[-45,+34]mm · 2 of 80 slices shown (3 of 3)]
[im 1/80]
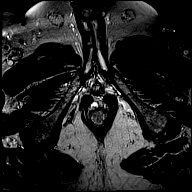
[im 80/80]
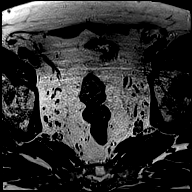

[Series 10: pre t1_twist_tra_dyn · axial · non-contrast · 3.5mm · 0.83mm/px · 1 of 20 slices shown]
[im 1/20]
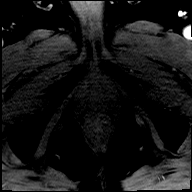

[Series 11: post t1_twist_tra_dyn-copy center · axial · non-contrast · 3.5mm · 0.83mm/px · z∈[-40,+26]mm · 17 of 600 slices shown]
[im 1/600]
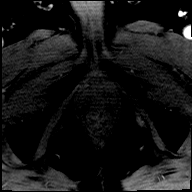
[im 38/600]
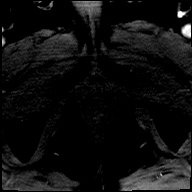
[im 75/600]
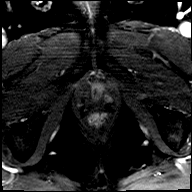
[im 113/600]
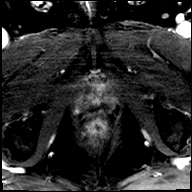
[im 150/600]
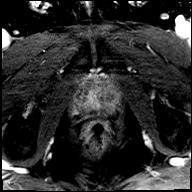
[im 188/600]
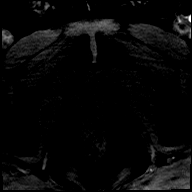
[im 225/600]
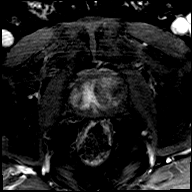
[im 263/600]
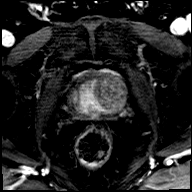
[im 300/600]
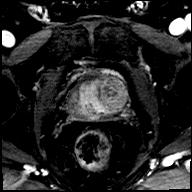
[im 337/600]
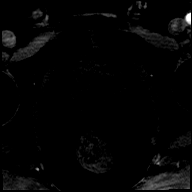
[im 375/600]
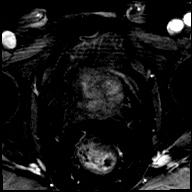
[im 412/600]
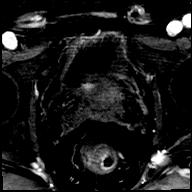
[im 450/600]
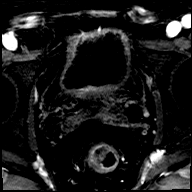
[im 487/600]
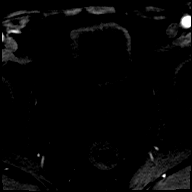
[im 525/600]
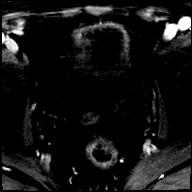
[im 562/600]
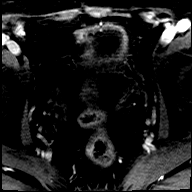
[im 600/600]
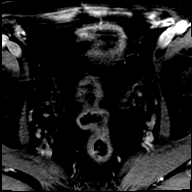

[Series 12: post t1_twist_tra_dyn-copy cent_sub · axial · 3.5mm · 0.83mm/px · z∈[-40,+26]mm · 17 of 579 slices shown]
[im 1/579]
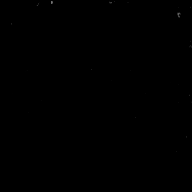
[im 37/579]
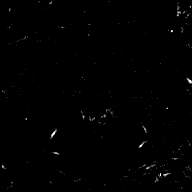
[im 73/579]
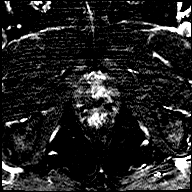
[im 109/579]
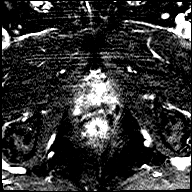
[im 145/579]
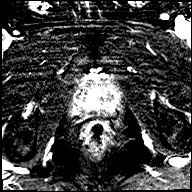
[im 181/579]
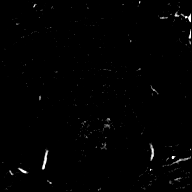
[im 217/579]
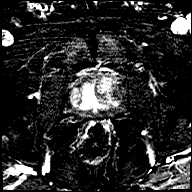
[im 253/579]
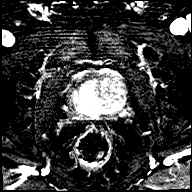
[im 290/579]
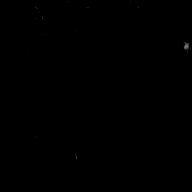
[im 326/579]
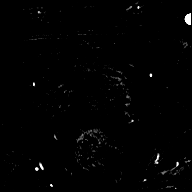
[im 362/579]
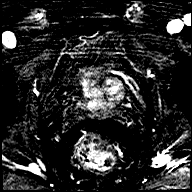
[im 398/579]
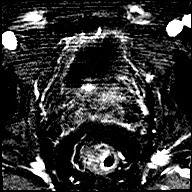
[im 434/579]
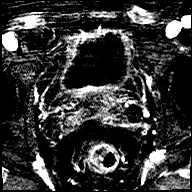
[im 470/579]
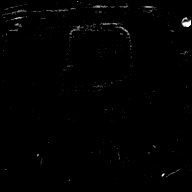
[im 506/579]
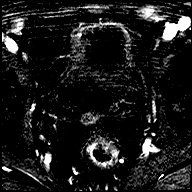
[im 542/579]
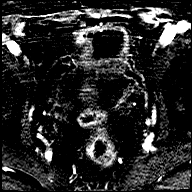
[im 579/579]
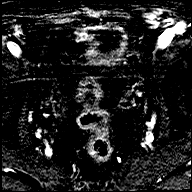

[Series 13: t1_vibe_dixon_tra_f · axial · 2.5mm · 0.91mm/px · z∈[-68,+130]mm · 2 of 80 slices shown]
[im 1/80]
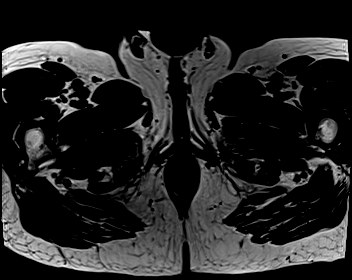
[im 80/80]
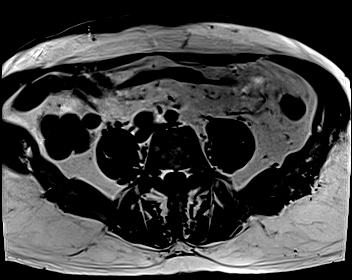

[Series 14: t1_vibe_dixon_tra_w · axial · 2.5mm · 0.91mm/px · z∈[-68,+130]mm · 2 of 80 slices shown]
[im 1/80]
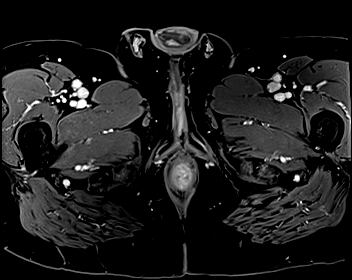
[im 80/80]
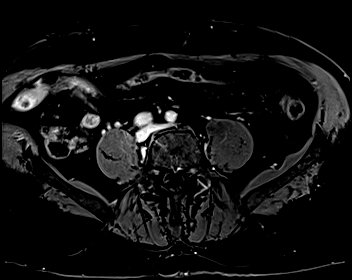

[48 of 48 positions shown; findings below may reference images not displayed]

FINDINGS: Prostate: There are no foci of restricted diffusion within the
peripheral zone (series 5 and 6).

Peripheral zone is thinned by the enlarged transitional zone. No
focal lesion on T2 weighted imaging in the peripheral zone (series
8). Postcontrast imaging demonstrates no suspicious enhancement in
the peripheral zone.

The transitional zone is enlarged by capsulated nodules. No
suspicious imaging findings on T2 weighted imaging.

The LEFT seminal vesicle is diminutive. No suspicious imaging
findings.

Volume: 5.1 x 4.0 x 5.0 cm (volume = 53 cm^3)

Transcapsular spread:  Absent

Seminal vesicle involvement: Absent

Neurovascular bundle involvement: Absent

Pelvic adenopathy: Absent

Bone metastasis: Absent

Other findings: None
IMPRESSION: 1. No high-grade carcinoma in the peripheral zone.  PI-RADS: 1
2. Enlarged nodular transitional zone most consistent benign
prostate hypertrophy. PI-RADS: 2

## 2023-10-30 HISTORY — PX: PARS PLANA REPAIR OF RETINAL DEATACHMENT: SHX2165

## 2024-02-01 ENCOUNTER — Other Ambulatory Visit: Payer: Self-pay

## 2024-02-01 ENCOUNTER — Emergency Department (HOSPITAL_COMMUNITY): Admitting: Anesthesiology

## 2024-02-01 ENCOUNTER — Encounter (HOSPITAL_COMMUNITY)
Admission: EM | Disposition: A | Discharge status: Rehabilitation Facility | Payer: Self-pay | Source: Home / Self Care | Attending: Neurological Surgery

## 2024-02-01 ENCOUNTER — Inpatient Hospital Stay (HOSPITAL_COMMUNITY)
Admission: EM | Admit: 2024-02-01 | Discharge: 2024-02-06 | DRG: 026 | Disposition: A | Discharge status: Rehabilitation Facility | Attending: Neurological Surgery | Admitting: Neurological Surgery

## 2024-02-01 ENCOUNTER — Encounter (HOSPITAL_COMMUNITY): Payer: Self-pay | Admitting: *Deleted

## 2024-02-01 ENCOUNTER — Emergency Department (HOSPITAL_COMMUNITY)

## 2024-02-01 DIAGNOSIS — Z6831 Body mass index (BMI) 31.0-31.9, adult: Secondary | ICD-10-CM | POA: Diagnosis not present

## 2024-02-01 DIAGNOSIS — S066XAA Traumatic subarachnoid hemorrhage with loss of consciousness status unknown, initial encounter: Secondary | ICD-10-CM | POA: Diagnosis present

## 2024-02-01 DIAGNOSIS — Z79899 Other long term (current) drug therapy: Secondary | ICD-10-CM | POA: Diagnosis not present

## 2024-02-01 DIAGNOSIS — N400 Enlarged prostate without lower urinary tract symptoms: Secondary | ICD-10-CM | POA: Diagnosis present

## 2024-02-01 DIAGNOSIS — Z555 Less than a high school diploma: Secondary | ICD-10-CM | POA: Diagnosis not present

## 2024-02-01 DIAGNOSIS — Z7982 Long term (current) use of aspirin: Secondary | ICD-10-CM

## 2024-02-01 DIAGNOSIS — Z880 Allergy status to penicillin: Secondary | ICD-10-CM

## 2024-02-01 DIAGNOSIS — S065X0A Traumatic subdural hemorrhage without loss of consciousness, initial encounter: Secondary | ICD-10-CM

## 2024-02-01 DIAGNOSIS — Z9889 Other specified postprocedural states: Secondary | ICD-10-CM | POA: Diagnosis not present

## 2024-02-01 DIAGNOSIS — W06XXXA Fall from bed, initial encounter: Secondary | ICD-10-CM | POA: Diagnosis present

## 2024-02-01 DIAGNOSIS — E7800 Pure hypercholesterolemia, unspecified: Secondary | ICD-10-CM | POA: Diagnosis present

## 2024-02-01 DIAGNOSIS — R4701 Aphasia: Secondary | ICD-10-CM | POA: Diagnosis present

## 2024-02-01 DIAGNOSIS — I951 Orthostatic hypotension: Secondary | ICD-10-CM | POA: Diagnosis not present

## 2024-02-01 DIAGNOSIS — G9340 Encephalopathy, unspecified: Secondary | ICD-10-CM | POA: Diagnosis present

## 2024-02-01 DIAGNOSIS — G459 Transient cerebral ischemic attack, unspecified: Secondary | ICD-10-CM

## 2024-02-01 DIAGNOSIS — R001 Bradycardia, unspecified: Secondary | ICD-10-CM | POA: Diagnosis present

## 2024-02-01 DIAGNOSIS — Z8673 Personal history of transient ischemic attack (TIA), and cerebral infarction without residual deficits: Secondary | ICD-10-CM | POA: Diagnosis not present

## 2024-02-01 DIAGNOSIS — E66811 Obesity, class 1: Secondary | ICD-10-CM | POA: Diagnosis present

## 2024-02-01 DIAGNOSIS — N179 Acute kidney failure, unspecified: Secondary | ICD-10-CM | POA: Diagnosis not present

## 2024-02-01 DIAGNOSIS — G8191 Hemiplegia, unspecified affecting right dominant side: Secondary | ICD-10-CM | POA: Diagnosis present

## 2024-02-01 DIAGNOSIS — T380X5A Adverse effect of glucocorticoids and synthetic analogues, initial encounter: Secondary | ICD-10-CM | POA: Diagnosis not present

## 2024-02-01 DIAGNOSIS — I10 Essential (primary) hypertension: Secondary | ICD-10-CM | POA: Diagnosis present

## 2024-02-01 DIAGNOSIS — S065XAA Traumatic subdural hemorrhage with loss of consciousness status unknown, initial encounter: Secondary | ICD-10-CM | POA: Diagnosis not present

## 2024-02-01 DIAGNOSIS — R739 Hyperglycemia, unspecified: Secondary | ICD-10-CM | POA: Diagnosis present

## 2024-02-01 DIAGNOSIS — S065XAD Traumatic subdural hemorrhage with loss of consciousness status unknown, subsequent encounter: Secondary | ICD-10-CM | POA: Diagnosis not present

## 2024-02-01 DIAGNOSIS — W06XXXD Fall from bed, subsequent encounter: Secondary | ICD-10-CM | POA: Diagnosis present

## 2024-02-01 DIAGNOSIS — R410 Disorientation, unspecified: Secondary | ICD-10-CM | POA: Diagnosis present

## 2024-02-01 DIAGNOSIS — Z48811 Encounter for surgical aftercare following surgery on the nervous system: Secondary | ICD-10-CM | POA: Diagnosis not present

## 2024-02-01 DIAGNOSIS — Y92003 Bedroom of unspecified non-institutional (private) residence as the place of occurrence of the external cause: Secondary | ICD-10-CM | POA: Diagnosis not present

## 2024-02-01 DIAGNOSIS — E785 Hyperlipidemia, unspecified: Secondary | ICD-10-CM

## 2024-02-01 DIAGNOSIS — K5900 Constipation, unspecified: Secondary | ICD-10-CM | POA: Diagnosis present

## 2024-02-01 DIAGNOSIS — R350 Frequency of micturition: Secondary | ICD-10-CM | POA: Diagnosis present

## 2024-02-01 DIAGNOSIS — E871 Hypo-osmolality and hyponatremia: Secondary | ICD-10-CM | POA: Diagnosis present

## 2024-02-01 DIAGNOSIS — D72829 Elevated white blood cell count, unspecified: Secondary | ICD-10-CM | POA: Diagnosis present

## 2024-02-01 DIAGNOSIS — H3322 Serous retinal detachment, left eye: Secondary | ICD-10-CM | POA: Diagnosis present

## 2024-02-01 DIAGNOSIS — I6200 Nontraumatic subdural hemorrhage, unspecified: Secondary | ICD-10-CM | POA: Diagnosis not present

## 2024-02-01 DIAGNOSIS — S065X9S Traumatic subdural hemorrhage with loss of consciousness of unspecified duration, sequela: Secondary | ICD-10-CM | POA: Diagnosis not present

## 2024-02-01 DIAGNOSIS — N401 Enlarged prostate with lower urinary tract symptoms: Secondary | ICD-10-CM | POA: Diagnosis present

## 2024-02-01 HISTORY — DX: Transient cerebral ischemic attack, unspecified: G45.9

## 2024-02-01 HISTORY — PX: CRANIOTOMY: SHX93

## 2024-02-01 HISTORY — DX: Pure hypercholesterolemia, unspecified: E78.00

## 2024-02-01 LAB — DIFFERENTIAL
Abs Immature Granulocytes: 0.06 K/uL (ref 0.00–0.07)
Basophils Absolute: 0.1 K/uL (ref 0.0–0.1)
Basophils Relative: 1 %
Eosinophils Absolute: 0.1 K/uL (ref 0.0–0.5)
Eosinophils Relative: 1 %
Immature Granulocytes: 1 %
Lymphocytes Relative: 9 %
Lymphs Abs: 0.8 K/uL (ref 0.7–4.0)
Monocytes Absolute: 0.5 K/uL (ref 0.1–1.0)
Monocytes Relative: 6 %
Neutro Abs: 7.4 K/uL (ref 1.7–7.7)
Neutrophils Relative %: 82 %

## 2024-02-01 LAB — CBC
HCT: 41.5 % (ref 39.0–52.0)
Hemoglobin: 14.6 g/dL (ref 13.0–17.0)
MCH: 30.7 pg (ref 26.0–34.0)
MCHC: 35.2 g/dL (ref 30.0–36.0)
MCV: 87.2 fL (ref 80.0–100.0)
Platelets: 215 K/uL (ref 150–400)
RBC: 4.76 MIL/uL (ref 4.22–5.81)
RDW: 12.6 % (ref 11.5–15.5)
WBC: 8.9 K/uL (ref 4.0–10.5)
nRBC: 0 % (ref 0.0–0.2)

## 2024-02-01 LAB — COMPREHENSIVE METABOLIC PANEL WITH GFR
ALT: 22 U/L (ref 0–44)
AST: 21 U/L (ref 15–41)
Albumin: 4.2 g/dL (ref 3.5–5.0)
Alkaline Phosphatase: 71 U/L (ref 38–126)
Anion gap: 15 (ref 5–15)
BUN: 15 mg/dL (ref 8–23)
CO2: 20 mmol/L — ABNORMAL LOW (ref 22–32)
Calcium: 9.1 mg/dL (ref 8.9–10.3)
Chloride: 106 mmol/L (ref 98–111)
Creatinine, Ser: 1.11 mg/dL (ref 0.61–1.24)
GFR, Estimated: >60 mL/min (ref >60)
Glucose, Bld: 100 mg/dL — ABNORMAL HIGH (ref 70–99)
Potassium: 3.9 mmol/L (ref 3.5–5.1)
Sodium: 141 mmol/L (ref 135–145)
Total Bilirubin: 2.3 mg/dL — ABNORMAL HIGH (ref 0.0–1.2)
Total Protein: 6.7 g/dL (ref 6.5–8.1)

## 2024-02-01 LAB — PROTIME-INR
INR: 1 (ref 0.8–1.2)
Prothrombin Time: 13.5 s (ref 11.4–15.2)

## 2024-02-01 LAB — I-STAT CHEM 8, ED
BUN: 16 mg/dL (ref 8–23)
Calcium, Ion: 1.15 mmol/L (ref 1.15–1.40)
Chloride: 106 mmol/L (ref 98–111)
Creatinine, Ser: 1.1 mg/dL (ref 0.61–1.24)
Glucose, Bld: 102 mg/dL — ABNORMAL HIGH (ref 70–99)
HCT: 40 % (ref 39.0–52.0)
Hemoglobin: 13.6 g/dL (ref 13.0–17.0)
Potassium: 3.9 mmol/L (ref 3.5–5.1)
Sodium: 139 mmol/L (ref 135–145)
TCO2: 21 mmol/L — ABNORMAL LOW (ref 22–32)

## 2024-02-01 LAB — TYPE AND SCREEN
ABO/RH(D): O POS
Antibody Screen: NEGATIVE

## 2024-02-01 LAB — ABO/RH: ABO/RH(D): O POS

## 2024-02-01 LAB — APTT: aPTT: 26 s (ref 24–36)

## 2024-02-01 LAB — MRSA NEXT GEN BY PCR, NASAL: MRSA by PCR Next Gen: NOT DETECTED

## 2024-02-01 SURGERY — CRANIOTOMY HEMATOMA EVACUATION SUBDURAL
Anesthesia: General | Site: Head | Laterality: Left

## 2024-02-01 MED ORDER — PHENYLEPHRINE HCL-NACL 20-0.9 MG/250ML-% IV SOLN
INTRAVENOUS | Status: DC | PRN
Start: 2024-02-01 — End: 2024-02-01
  Administered 2024-02-01: 25 ug/min via INTRAVENOUS

## 2024-02-01 MED ORDER — DOCUSATE SODIUM 100 MG PO CAPS
100.0000 mg | ORAL_CAPSULE | Freq: Two times a day (BID) | ORAL | Status: DC
  Administered 2024-02-01 – 2024-02-06 (×9): 100 mg via ORAL
  Filled 2024-02-01 (×9): qty 1

## 2024-02-01 MED ORDER — PROPOFOL 10 MG/ML IV BOLUS
INTRAVENOUS | Status: DC | PRN
  Administered 2024-02-01: 30 mg via INTRAVENOUS
  Administered 2024-02-01: 120 mg via INTRAVENOUS

## 2024-02-01 MED ORDER — ORAL CARE MOUTH RINSE: 15.0000 mL | OROMUCOSAL | Status: DC | PRN

## 2024-02-01 MED ORDER — SENNA 8.6 MG PO TABS
1.0000 | ORAL_TABLET | Freq: Two times a day (BID) | ORAL | Status: DC
  Administered 2024-02-01 – 2024-02-06 (×9): 8.6 mg via ORAL
  Filled 2024-02-01 (×9): qty 1

## 2024-02-01 MED ORDER — LIDOCAINE-EPINEPHRINE 1 %-1:100000 IJ SOLN
INTRAMUSCULAR | Status: AC
  Filled 2024-02-01: qty 1

## 2024-02-01 MED ORDER — ONDANSETRON HCL 4 MG/2ML IJ SOLN
INTRAMUSCULAR | Status: DC | PRN
  Administered 2024-02-01: 4 mg via INTRAVENOUS

## 2024-02-01 MED ORDER — LABETALOL HCL 5 MG/ML IV SOLN
10.0000 mg | INTRAVENOUS | Status: DC | PRN
  Administered 2024-02-01: 30 mg via INTRAVENOUS
  Administered 2024-02-01: 40 mg via INTRAVENOUS
  Administered 2024-02-01 (×2): 20 mg via INTRAVENOUS
  Administered 2024-02-01: 10 mg via INTRAVENOUS
  Administered 2024-02-02 (×2): 20 mg via INTRAVENOUS
  Filled 2024-02-01: qty 8
  Filled 2024-02-01: qty 4
  Filled 2024-02-01: qty 8
  Filled 2024-02-01 (×2): qty 4
  Filled 2024-02-01: qty 8
  Filled 2024-02-01: qty 4

## 2024-02-01 MED ORDER — ONDANSETRON HCL 4 MG PO TABS
4.0000 mg | ORAL_TABLET | ORAL | Status: DC | PRN
Start: 2024-02-01 — End: 2024-02-06

## 2024-02-01 MED ORDER — CEFAZOLIN SODIUM 1 G IJ SOLR
INTRAMUSCULAR | Status: AC
  Filled 2024-02-01: qty 20

## 2024-02-01 MED ORDER — CLEVIDIPINE BUTYRATE 0.5 MG/ML IV EMUL
INTRAVENOUS | Status: DC | PRN
  Administered 2024-02-01: 4 mg/h via INTRAVENOUS

## 2024-02-01 MED ORDER — SUGAMMADEX SODIUM 200 MG/2ML IV SOLN
INTRAVENOUS | Status: DC | PRN
  Administered 2024-02-01: 200 mg via INTRAVENOUS

## 2024-02-01 MED ORDER — DEXAMETHASONE 4 MG PO TABS
6.0000 mg | ORAL_TABLET | Freq: Four times a day (QID) | ORAL | Status: AC
  Administered 2024-02-01 – 2024-02-02 (×4): 6 mg via ORAL
  Filled 2024-02-01 (×4): qty 2

## 2024-02-01 MED ORDER — SODIUM CHLORIDE 0.9 % IV SOLN
INTRAVENOUS | Status: DC | PRN
  Administered 2024-02-01: 500 mg via INTRAVENOUS

## 2024-02-01 MED ORDER — LIDOCAINE-EPINEPHRINE 1 %-1:100000 IJ SOLN
INTRAMUSCULAR | Status: DC | PRN
  Administered 2024-02-01: 10 mL via INTRADERMAL

## 2024-02-01 MED ORDER — PROMETHAZINE HCL 25 MG PO TABS: 12.5000 mg | ORAL_TABLET | ORAL | Status: DC | PRN

## 2024-02-01 MED ORDER — LACTATED RINGERS IV SOLN: INTRAVENOUS | Status: DC | PRN

## 2024-02-01 MED ORDER — CEFAZOLIN SODIUM-DEXTROSE 1-4 GM/50ML-% IV SOLN
INTRAVENOUS | Status: DC | PRN
Start: 2024-02-01 — End: 2024-02-01
  Administered 2024-02-01: 2 g via INTRAVENOUS

## 2024-02-01 MED ORDER — ONDANSETRON HCL 4 MG/2ML IJ SOLN: 4.0000 mg | INTRAMUSCULAR | Status: DC | PRN

## 2024-02-01 MED ORDER — LEVETIRACETAM (KEPPRA) 500 MG/5 ML ADULT IV PUSH
500.0000 mg | Freq: Two times a day (BID) | INTRAVENOUS | Status: DC
  Administered 2024-02-01: 500 mg via INTRAVENOUS
  Filled 2024-02-01: qty 5

## 2024-02-01 MED ORDER — THROMBIN 5000 UNITS EX SOLR: CUTANEOUS | Status: DC | PRN

## 2024-02-01 MED ORDER — CHLORHEXIDINE GLUCONATE 0.12 % MT SOLN
OROMUCOSAL | Status: AC
  Filled 2024-02-01: qty 15

## 2024-02-01 MED ORDER — 0.9 % SODIUM CHLORIDE (POUR BTL) OPTIME
TOPICAL | Status: DC | PRN
  Administered 2024-02-01 (×3): 1000 mL

## 2024-02-01 MED ORDER — DEXAMETHASONE SODIUM PHOSPHATE 10 MG/ML IJ SOLN
INTRAMUSCULAR | Status: DC | PRN
  Administered 2024-02-01: 4 mg via INTRAVENOUS

## 2024-02-01 MED ORDER — THROMBIN 5000 UNITS EX KIT
PACK | CUTANEOUS | Status: AC
Start: 2024-02-01 — End: 2024-02-01
  Filled 2024-02-01: qty 1

## 2024-02-01 MED ORDER — FENTANYL CITRATE (PF) 250 MCG/5ML IJ SOLN
INTRAMUSCULAR | Status: DC | PRN
  Administered 2024-02-01: 50 ug via INTRAVENOUS
  Administered 2024-02-01: 100 ug via INTRAVENOUS

## 2024-02-01 MED ORDER — BACITRACIN ZINC 500 UNIT/GM EX OINT
TOPICAL_OINTMENT | CUTANEOUS | Status: DC | PRN
  Administered 2024-02-01: 1 via TOPICAL

## 2024-02-01 MED ORDER — THROMBIN 20000 UNITS EX SOLR
CUTANEOUS | Status: AC
Start: 2024-02-01 — End: 2024-02-01
  Filled 2024-02-01: qty 20000

## 2024-02-01 MED ORDER — SUCCINYLCHOLINE CHLORIDE 200 MG/10ML IV SOSY
PREFILLED_SYRINGE | INTRAVENOUS | Status: DC | PRN
  Administered 2024-02-01: 100 mg via INTRAVENOUS

## 2024-02-01 MED ORDER — BISACODYL 5 MG PO TBEC: 5.0000 mg | DELAYED_RELEASE_TABLET | Freq: Every day | ORAL | Status: DC | PRN

## 2024-02-01 MED ORDER — DEXAMETHASONE 4 MG PO TABS
4.0000 mg | ORAL_TABLET | Freq: Four times a day (QID) | ORAL | Status: AC
  Administered 2024-02-03 (×4): 4 mg via ORAL
  Filled 2024-02-01 (×4): qty 1

## 2024-02-01 MED ORDER — BACITRACIN ZINC 500 UNIT/GM EX OINT
TOPICAL_OINTMENT | CUTANEOUS | Status: AC
  Filled 2024-02-01: qty 28.35

## 2024-02-01 MED ORDER — LIDOCAINE 2% (20 MG/ML) 5 ML SYRINGE
INTRAMUSCULAR | Status: DC | PRN
  Administered 2024-02-01: 40 mg via INTRAVENOUS

## 2024-02-01 MED ORDER — THROMBIN 20000 UNITS EX SOLR: CUTANEOUS | Status: DC | PRN

## 2024-02-01 MED ORDER — ATORVASTATIN CALCIUM 40 MG PO TABS
40.0000 mg | ORAL_TABLET | Freq: Every day | ORAL | Status: DC
  Administered 2024-02-02 – 2024-02-06 (×5): 40 mg via ORAL
  Filled 2024-02-01 (×5): qty 1

## 2024-02-01 MED ORDER — LABETALOL HCL 5 MG/ML IV SOLN
5.0000 mg | INTRAVENOUS | Status: DC | PRN
  Administered 2024-02-01 (×2): 5 mg via INTRAVENOUS

## 2024-02-01 MED ORDER — ACETAMINOPHEN 650 MG RE SUPP: 650.0000 mg | RECTAL | Status: DC | PRN

## 2024-02-01 MED ORDER — EPHEDRINE SULFATE-NACL 50-0.9 MG/10ML-% IV SOSY
PREFILLED_SYRINGE | INTRAVENOUS | Status: DC | PRN
Start: 2024-02-01 — End: 2024-02-01
  Administered 2024-02-01: 5 mg via INTRAVENOUS

## 2024-02-01 MED ORDER — CHLORHEXIDINE GLUCONATE CLOTH 2 % EX PADS
6.0000 | MEDICATED_PAD | Freq: Every day | CUTANEOUS | Status: DC
  Administered 2024-02-02 – 2024-02-04 (×3): 6 via TOPICAL

## 2024-02-01 MED ORDER — SODIUM CHLORIDE 0.9 % IV SOLN: INTRAVENOUS | Status: DC

## 2024-02-01 MED ORDER — HYDROMORPHONE HCL 1 MG/ML IJ SOLN
0.5000 mg | INTRAMUSCULAR | Status: DC | PRN
  Administered 2024-02-02 (×2): 1 mg via INTRAVENOUS
  Filled 2024-02-01 (×3): qty 1

## 2024-02-01 MED ORDER — FENTANYL CITRATE (PF) 100 MCG/2ML IJ SOLN: 25.0000 ug | INTRAMUSCULAR | Status: DC | PRN

## 2024-02-01 MED ORDER — FENTANYL CITRATE (PF) 250 MCG/5ML IJ SOLN
INTRAMUSCULAR | Status: AC
  Filled 2024-02-01: qty 5

## 2024-02-01 MED ORDER — LABETALOL HCL 5 MG/ML IV SOLN
10.0000 mg | INTRAVENOUS | Status: DC | PRN
  Administered 2024-02-01: 10 mg via INTRAVENOUS
  Filled 2024-02-01: qty 4

## 2024-02-01 MED ORDER — PROPOFOL 10 MG/ML IV BOLUS
INTRAVENOUS | Status: AC
Start: 2024-02-01 — End: 2024-02-01
  Filled 2024-02-01: qty 20

## 2024-02-01 MED ORDER — ORAL CARE MOUTH RINSE: 15.0000 mL | Freq: Once | OROMUCOSAL | Status: DC

## 2024-02-01 MED ORDER — HYDRALAZINE HCL 20 MG/ML IJ SOLN: 10.0000 mg | INTRAMUSCULAR | Status: DC | PRN

## 2024-02-01 MED ORDER — PANTOPRAZOLE SODIUM 40 MG IV SOLR
40.0000 mg | Freq: Every day | INTRAVENOUS | Status: DC
  Administered 2024-02-01: 40 mg via INTRAVENOUS
  Filled 2024-02-01: qty 10

## 2024-02-01 MED ORDER — DEXAMETHASONE 4 MG PO TABS
4.0000 mg | ORAL_TABLET | Freq: Three times a day (TID) | ORAL | Status: DC
  Administered 2024-02-03 – 2024-02-06 (×8): 4 mg via ORAL
  Filled 2024-02-01 (×8): qty 1

## 2024-02-01 MED ORDER — LABETALOL HCL 5 MG/ML IV SOLN
INTRAVENOUS | Status: AC
  Filled 2024-02-01: qty 4

## 2024-02-01 MED ORDER — LEVETIRACETAM 500 MG/5ML IV SOLN
INTRAVENOUS | Status: AC
  Filled 2024-02-01: qty 5

## 2024-02-01 MED ORDER — HYDROCODONE-ACETAMINOPHEN 5-325 MG PO TABS
1.0000 | ORAL_TABLET | ORAL | Status: DC | PRN
  Administered 2024-02-01: 1 via ORAL
  Filled 2024-02-01: qty 1

## 2024-02-01 MED ORDER — CHLORHEXIDINE GLUCONATE 0.12 % MT SOLN: 15.0000 mL | Freq: Once | OROMUCOSAL | Status: DC

## 2024-02-01 MED ORDER — ACETAMINOPHEN 325 MG PO TABS
650.0000 mg | ORAL_TABLET | ORAL | Status: DC | PRN
  Administered 2024-02-02 – 2024-02-06 (×3): 650 mg via ORAL
  Filled 2024-02-01 (×3): qty 2

## 2024-02-01 MED ORDER — ROCURONIUM BROMIDE 10 MG/ML (PF) SYRINGE
PREFILLED_SYRINGE | INTRAVENOUS | Status: DC | PRN
  Administered 2024-02-01: 50 mg via INTRAVENOUS

## 2024-02-01 SURGICAL SUPPLY — 47 items
BAG COUNTER SPONGE SURGICOUNT (BAG) ×1 IMPLANT
BNDG COHESIVE 4X5 TAN STRL LF (GAUZE/BANDAGES/DRESSINGS) IMPLANT
BUR SPIRAL ROUTER 2.3 (BUR) ×1 IMPLANT
CANISTER SUCTION 3000ML PPV (SUCTIONS) ×1 IMPLANT
CLIP TI MEDIUM 6 (CLIP) IMPLANT
DRAIN 10X20 3/4 PERF LF SIL ST (DRAIN) IMPLANT
DRAPE NEUROLOGICAL W/INCISE (DRAPES) ×1 IMPLANT
DRAPE SURG 17X23 STRL (DRAPES) IMPLANT
DRAPE WARM FLUID 44X44 (DRAPES) ×1 IMPLANT
DRESSING AQUACEL AG SP 3.5X6 (GAUZE/BANDAGES/DRESSINGS) IMPLANT
DURAPREP 6ML APPLICATOR 50/CS (WOUND CARE) ×1 IMPLANT
ELECTRODE REM PT RTRN 9FT ADLT (ELECTROSURGICAL) ×1 IMPLANT
EVACUATOR 1/8 PVC DRAIN (DRAIN) IMPLANT
GAUZE 4X4 16PLY ~~LOC~~+RFID DBL (SPONGE) IMPLANT
GAUZE SPONGE 4X4 12PLY STRL (GAUZE/BANDAGES/DRESSINGS) ×1 IMPLANT
GLOVE BIO SURGEON STRL SZ7 (GLOVE) ×1 IMPLANT
GLOVE BIO SURGEON STRL SZ8 (GLOVE) ×1 IMPLANT
GLOVE BIOGEL PI IND STRL 7.0 (GLOVE) ×1 IMPLANT
GOWN STRL REUS W/ TWL LRG LVL3 (GOWN DISPOSABLE) ×1 IMPLANT
GOWN STRL REUS W/ TWL XL LVL3 (GOWN DISPOSABLE) ×1 IMPLANT
HEMOSTAT POWDER KIT SURGIFOAM (HEMOSTASIS) ×1 IMPLANT
KIT BASIN OR (CUSTOM PROCEDURE TRAY) ×1 IMPLANT
KIT TURNOVER KIT B (KITS) ×1 IMPLANT
NDL HYPO 22X1.5 SAFETY MO (MISCELLANEOUS) ×1 IMPLANT
NEEDLE HYPO 22X1.5 SAFETY MO (MISCELLANEOUS) ×1 IMPLANT
NS IRRIG 1000ML POUR BTL (IV SOLUTION) ×1 IMPLANT
PACK BATTERY CMF DISP FOR DVR (ORTHOPEDIC DISPOSABLE SUPPLIES) IMPLANT
PACK CRANIOTOMY CUSTOM (CUSTOM PROCEDURE TRAY) ×1 IMPLANT
PAD ARMBOARD POSITIONER FOAM (MISCELLANEOUS) ×1 IMPLANT
PATTIES SURGICAL .5 X3 (DISPOSABLE) IMPLANT
PERFORATOR LRG 14-11MM (BIT) ×1 IMPLANT
PIN MAYFIELD SKULL DISP (PIN) IMPLANT
PLATE CRANIAL 12 2H RIGID UNI (Plate) IMPLANT
SCREW UNIII AXS SD 1.5X4 (Screw) IMPLANT
SPONGE NEURO XRAY DETECT 1X3 (DISPOSABLE) IMPLANT
SPONGE SURGIFOAM ABS GEL 100 (HEMOSTASIS) ×1 IMPLANT
STAPLER SKIN PROX 35W (STAPLE) ×1 IMPLANT
SUT 3-0 BLK 1X30 PSL (SUTURE) IMPLANT
SUT NURALON 4 0 TR CR/8 (SUTURE) ×2 IMPLANT
SUT VIC AB 2-0 CP2 18 (SUTURE) ×1 IMPLANT
SYR CONTROL 10ML LL (SYRINGE) ×1 IMPLANT
TOWEL GREEN STERILE (TOWEL DISPOSABLE) ×1 IMPLANT
TOWEL GREEN STERILE FF (TOWEL DISPOSABLE) ×1 IMPLANT
TRAY FOLEY MTR SLVR 16FR STAT (SET/KITS/TRAYS/PACK) ×1 IMPLANT
TUBING FEATHERFLOW (TUBING) IMPLANT
UNDERPAD 30X36 HEAVY ABSORB (UNDERPADS AND DIAPERS) IMPLANT
WATER STERILE IRR 1000ML POUR (IV SOLUTION) ×1 IMPLANT

## 2024-02-01 NOTE — H&P (Signed)
 Reason for Consult:SDH Referring Physician: EDP  Rollen Lancon is an 73 y.o. male.   HPI:  73 year old male presented to the hospital tonight with progressive confusion and right sided weakness over the last couple days. He apparenlty hit his head about 4 weeks ago. He has had headaches ever since then but they have been manageable. His headaches have also gotten worse over the last couple days. Denies any change in his vision, NV or dizziness. Does not take any blood thinners   Past Medical History:  Diagnosis Date   TIA (transient ischemic attack)     History reviewed. No pertinent surgical history.  Allergies  Allergen Reactions   Penicillins Hives, Rash and Other (See Comments)    blacked out Has patient had a PCN reaction causing immediate rash, facial/tongue/throat swelling, SOB or lightheadedness with hypotension: Yes Has patient had a PCN reaction causing severe rash involving mucus membranes or skin necrosis: Yes Has patient had a PCN reaction that required hospitalization: No Has patient had a PCN reaction occurring within the last 10 years: No If all of the above answers are NO, then may proceed with Cephalosporin use.     Social History   Tobacco Use   Smoking status: Never   Smokeless tobacco: Never  Substance Use Topics   Alcohol use: No    History reviewed. No pertinent family history.   Review of Systems  Positive ROS: as above  All other systems have been reviewed and were otherwise negative with the exception of those mentioned in the HPI and as above.  Objective: Vital signs in last 24 hours: Temp:  [98 F (36.7 C)-98.3 F (36.8 C)] 98 F (36.7 C) (09/03 1652) Pulse Rate:  [52-63] 52 (09/03 1845) Resp:  [19-20] 19 (09/03 1845) BP: (145-150)/(64-86) 150/82 (09/03 1845) SpO2:  [95 %-98 %] 95 % (09/03 1845) Weight:  [90.7 kg] 90.7 kg (09/03 1530)  General Appearance: Alert, cooperative, no distress, appears stated age Head: Normocephalic,  without obvious abnormality, atraumatic Eyes: PERRL, conjunctiva/corneas clear, EOM's intact, fundi benign, both eyes      Lungs: respirations unlabored Heart: Regular rate and rhythm Skin: Skin color, texture, turgor normal, no rashes or lesions  NEUROLOGIC:   Mental status: A&O x4, no aphasia, good attention span, Memory and fund of knowledge impaired Motor Exam - grossly normal, normal tone and bulk Sensory Exam - grossly normal Reflexes: symmetric, no pathologic reflexes, No Hoffman's, No clonus Coordination - grossly normal Gait - not tested Balance - not tested Cranial Nerves: I: smell Not tested  II: visual acuity  OS: na    OD: na  II: visual fields Full to confrontation  II: pupils Equal, round, reactive to light  III,VII: ptosis None  III,IV,VI: extraocular muscles  Full ROM  V: mastication Normal  V: facial light touch sensation  Normal  V,VII: corneal reflex  Present  VII: facial muscle function - upper  Normal  VII: facial muscle function - lower Normal  VIII: hearing Not tested  IX: soft palate elevation  Normal  IX,X: gag reflex Present  XI: trapezius strength  5/5  XI: sternocleidomastoid strength 5/5  XI: neck flexion strength  5/5  XII: tongue strength  Normal    Data Review Lab Results  Component Value Date   WBC 8.9 02/01/2024   HGB 13.6 02/01/2024   HCT 40.0 02/01/2024   MCV 87.2 02/01/2024   PLT 215 02/01/2024   Lab Results  Component Value Date   NA 139  02/01/2024   K 3.9 02/01/2024   CL 106 02/01/2024   CO2 20 (L) 02/01/2024   BUN 16 02/01/2024   CREATININE 1.10 02/01/2024   GLUCOSE 102 (H) 02/01/2024   Lab Results  Component Value Date   INR 1.0 02/01/2024    Radiology: CT HEAD WO CONTRAST Addendum Date: 02/01/2024 ADDENDUM REPORT: 02/01/2024 17:18 ADDENDUM: Impression #1 called by telephone on 02/01/2024 at 5:17 pm to provider Dr. Neysa, who verbally acknowledged these results. Electronically Signed   By: Rockey Childs D.O.   On:  02/01/2024 17:18   Result Date: 02/01/2024 CLINICAL DATA:  Provided history: Neuro deficit, acute, stroke suspected. Right-sided weakness. Slurred speech. Right-sided facial droop. EXAM: CT HEAD WITHOUT CONTRAST TECHNIQUE: Contiguous axial images were obtained from the base of the skull through the vertex without intravenous contrast. RADIATION DOSE REDUCTION: This exam was performed according to the departmental dose-optimization program which includes automated exposure control, adjustment of the mA and/or kV according to patient size and/or use of iterative reconstruction technique. COMPARISON:  Brain MRI 10/23/2016. FINDINGS: Brain: Mild generalized cerebral atrophy. Mixed density subdural hematoma centered along the left frontal convexity (measuring up to 2.3 cm in thickness). The subdural hematoma contains low-density, intermediate density and hyperdense components. Mass effect upon the underlying brain parenchyma with 8 mm rightward midline shift. The basal cisterns remain patent. No demarcated cortical infarct. No evidence of an intracranial mass. Vascular: No hyperdense vessel.  Atherosclerotic calcifications. Skull: No calvarial fracture or aggressive osseous lesion. Sinuses/Orbits: No mass or acute finding within the imaged orbits. No significant paranasal sinus disease at the imaged levels. Attempts are being made to reach the ordering provider at this time. IMPRESSION: 1. Mixed density subdural hematoma centered along the left frontal convexity (measuring up to 2.3 mm in thickness). Mass effect upon the underlying brain parenchyma with 8 mm rightward midline shift. 2. Mild cerebral atrophy. Electronically Signed: By: Rockey Childs D.O. On: 02/01/2024 17:13     Assessment/Plan: 73 year old male presented to the ED with progressive confusion and weakness. 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 We are going to plan on taking him to the OR tonight for a craniotomy. We  discussed the risks with the patient and his wife which include, bleeding, infection, paralysis, lack of symptom relief, stroke, speech impairment, need for further surgery and anesthesia risks which include PE, MI, pneumona, an death. They understand and agree to move forward.    Suzen Lacks Lakelynn Severtson 02/01/2024 7:05 PM

## 2024-02-01 NOTE — ED Notes (Signed)
 The pt has much difficulty with his speech  and sometimes follows commands but other times he is unable to do the task  he knows that he is having difficulty

## 2024-02-01 NOTE — Anesthesia Procedure Notes (Signed)
 Arterial Line Insertion Start/End9/07/2023 7:42 PM, 02/01/2024 7:43 PM Performed by: Tilford Franky BIRCH, MD, Claudene Florina Boga, CRNA  Patient location: OR. Preanesthetic checklist: patient identified, IV checked, site marked, risks and benefits discussed, surgical consent, monitors and equipment checked, pre-op evaluation, timeout performed and anesthesia consent Patient sedated Left, radial was placed Catheter size: 20 G  Attempts: 1 Procedure performed without using ultrasound guided technique. Following insertion, Biopatch. Post procedure assessment: normal  Patient tolerated the procedure well with no immediate complications.

## 2024-02-01 NOTE — Progress Notes (Signed)
 Pt's surgery deemed an emergency. Minimal charting completed.

## 2024-02-01 NOTE — ED Provider Notes (Signed)
 [place] EMERGENCY DEPARTMENT AT [facility] Provider Note   CSN: [id number] Arrival date & time: 02/01/24  1457     Patient presents with: Weakness (Right sided weakness) and Aphasia   Edwin Dickerson is a 73 y.o. male.   This is a 73 year old male presenting emergency department with right-sided weakness with concern for stroke.  Reports fall out of bed  a little over a month ago hit his head on the nightstand.  Had a little bit of a headache thereafter, but no real symptoms.  He had eye surgery last month and since then has had some balance issues, but no trauma or falls.  Headache intermittently for the past couple weeks.  3 days ago noted some forgetfulness and difficulty finding words, some slurred speech with progressive weakness in right upper extremity and right lower extremity.  He seemed to have difficulty  pouring drinks.  Family notes he seems unsteady on his feet.  He does not take a blood thinner   Weakness      Prior to Admission medications   Medication Sig Start Date End Date Taking? Authorizing Provider  aspirin  81 MG EC tablet Take 1 tablet (81 mg total) by mouth daily. 11/01/16   Gonfa, Taye T, MD  atorvastatin  (LIPITOR) 40 MG tablet Take 1 tablet (40 mg total) by mouth daily at 6 PM. 11/01/16   Gonfa, Taye T, MD  Chlorpheniramine Maleate (ALLERGY RELIEF PO) Take 1 tablet by mouth daily as needed (allergies).    [provider]  OVER THE COUNTER MEDICATION Place 1 spray into both nostrils daily as needed (allergies).    [provider]  tadalafil (CIALIS) 5 MG tablet Take 5 mg by mouth daily.     [provider]    Allergies: Penicillins    Review of Systems  Neurological:  Positive for weakness.    Updated Vital Signs BP (!) 146/64 (BP Location: Right Arm)   Pulse (!) 57   Temp 98 F (36.7 C) (Oral)   Resp 19   Ht 5' 7 (1.702 m)   Wt 90.7 kg   SpO2 97%   BMI 31.32 kg/m   Physical Exam Vitals and nursing note  reviewed.  Constitutional:      General: He is not in acute distress.    Appearance: He is not toxic-appearing.  HENT:     Nose: Nose normal.     Mouth/Throat:     Mouth: Mucous membranes are moist.  Eyes:     Conjunctiva/sclera: Conjunctivae normal.  Cardiovascular:     Rate and Rhythm: Normal rate and regular rhythm.  Pulmonary:     Effort: Pulmonary effort is normal.     Breath sounds: Normal breath sounds.  Abdominal:     General: Abdomen is flat. There is no distension.     Tenderness: There is no abdominal tenderness. There is no guarding or rebound.  Musculoskeletal:        General: Normal range of motion.  Skin:    General: Skin is warm and dry.     Capillary Refill: Capillary refill takes less than 2 seconds.  Neurological:     General: No focal deficit present.     Mental Status: He is alert and oriented to person, place, and time.     Cranial Nerves: No cranial nerve deficit.     Sensory: No sensory deficit.     Motor: Weakness (minor weakness to RUE and RLE compared to left. cordinated movements.) present.  Psychiatric:        Mood and Affect: Mood normal.        Behavior: Behavior normal.     (all labs ordered are listed, but only abnormal results are displayed) Labs Reviewed  COMPREHENSIVE METABOLIC PANEL WITH GFR - Abnormal; Notable for the following components:      Result Value   CO2 20 (*)    Glucose, Bld 100 (*)    Total Bilirubin 2.3 (*)    All other components within normal limits  I-STAT CHEM 8, ED - Abnormal; Notable for the following components:   Glucose, Bld 102 (*)    TCO2 21 (*)    All other components within normal limits  PROTIME-INR  APTT  CBC  DIFFERENTIAL    EKG: EKG Interpretation Date/Time:  Wednesday February 01 2024 15:56:59 EDT Ventricular Rate:  60 PR Interval:  164 QRS Duration:  116 QT Interval:  472 QTC Calculation: 472 R Axis:   -48  Text Interpretation: Normal sinus rhythm Left anterior fascicular block  Minimal voltage criteria for LVH, may be normal variant ( Cornell product )  Compared with prior EKG from 10/22/2016 Confirmed by Gennaro Bouchard ([id number]) on 02/01/2024 4:00:18 PM  Radiology: CT HEAD WO CONTRAST Addendum Date: 02/01/2024 ADDENDUM REPORT: 02/01/2024 17:18 ADDENDUM: Impression #1 called by telephone on 02/01/2024 at 5:17 pm to provider Dr. Neysa, who verbally acknowledged these results. Electronically Signed   By: Rockey Childs D.O.   On: 02/01/2024 17:18   Result Date: 02/01/2024 CLINICAL DATA:  Provided history: Neuro deficit, acute, stroke suspected. Right-sided weakness. Slurred speech. Right-sided facial droop. EXAM: CT HEAD WITHOUT CONTRAST TECHNIQUE: Contiguous axial images were obtained from the base of the skull through the vertex without intravenous contrast. RADIATION DOSE REDUCTION: This exam was performed according to the departmental dose-optimization program which includes automated exposure control, adjustment of the mA and/or kV according to patient size and/or use of iterative reconstruction technique. COMPARISON:  Brain MRI 10/23/2016. FINDINGS: Brain: Mild generalized cerebral atrophy. Mixed density subdural hematoma centered along the left frontal convexity (measuring up to 2.3 cm in thickness). The subdural hematoma contains low-density, intermediate density and hyperdense components. Mass effect upon the underlying brain parenchyma with 8 mm rightward midline shift. The basal cisterns remain patent. No demarcated cortical infarct. No evidence of an intracranial mass. Vascular: No hyperdense vessel.  Atherosclerotic calcifications. Skull: No calvarial fracture or aggressive osseous lesion. Sinuses/Orbits: No mass or acute finding within the imaged orbits. No significant paranasal sinus disease at the imaged levels. Attempts are being made to reach the ordering provider at this time. IMPRESSION: 1. Mixed density subdural hematoma centered along the left frontal convexity (measuring  up to 2.3 mm in thickness). Mass effect upon the underlying brain parenchyma with 8 mm rightward midline shift. 2. Mild cerebral atrophy. Electronically Signed: By: Rockey Childs D.O. On: 02/01/2024 17:13     .Critical Care  Performed by: Neysa Caron PARAS, DO Authorized by: Neysa Caron PARAS, DO   Critical care provider statement:    Critical care time (minutes):  30   Critical care was necessary to treat or prevent imminent or life-threatening deterioration of the following conditions:  CNS failure or compromise   Critical care was time spent personally by me on the following activities:  Development of treatment plan with patient or surrogate, discussions with consultants, evaluation of patient's response to treatment, examination of patient, ordering and review of laboratory studies, ordering and review of radiographic studies, ordering and performing treatments  and interventions, pulse oximetry, re-evaluation of patient's condition and review of old charts    Medications Ordered in the ED - No data to display  Clinical Course as of 02/01/24 1751  Wed Feb 01, 2024  1716 Radiology called regarding CT head. Has Mixed density. Left subdural 2.3cm. 8mm righ shift.   NSGY consulted. [TY]  1729 Patient BP 127/115. Minor weakness on right as compared to left. Mentating well. Normal sensation. CN intact.  [TY]  1735 Discussed with neurosurgery who will come evaluate patient. [TY]    Clinical Course User Index [TY] Neysa Caron PARAS, DO                                 Medical Decision Making Is a 73 year old male presenting emergency department with progressive weakness in his right upper and lower extremity with slurred speech.  He is afebrile nontachycardic, slightly hypertensive on arrival 145/86.  Physical exam so minor weakness in his right compared to his left.  His speech is intelligible, but family states that it is slightly slurred compared to his baseline.  CT head with subdural hematoma  with shift.  Blood pressure currently in the 120s systolic.  Head of bed elevated.  Neurosurgery consulted and will see patient.  His other workup labs largely reassuring as well.  No leukocytosis no anemia.  Coags normal.  Comprehensive panel with no metabolic derangements.  No elevation in BUN/creatinine.  ECG without arrhythmia or ischemic changes.  Will plan for admission  Amount and/or Complexity of Data Reviewed Independent Historian:     Details: Feeling members note speech not same External Data Reviewed:     Details: Not on blood thinner. Labs:     Details: See above Radiology:     Details: I do appreciate obvious subdural hematoma with midline shift on my independent review of images Discussion of management or test interpretation with external provider(s): Neurosurgery  Risk Decision regarding hospitalization. Diagnosis or treatment significantly limited by social determinants of health. Risk Details: I did discuss goals of care with family member and patient.  Full code and full scope of treatment.       Final diagnoses:  None    ED Discharge Orders     None          Neysa Caron PARAS, DO 02/01/24 1751

## 2024-02-01 NOTE — ED Triage Notes (Signed)
 The pt had retina surgery on his lt eye 6 weeks ago    he had a tia  8 years ago  severe headache

## 2024-02-01 NOTE — Progress Notes (Signed)
 Patient ID: Edwin Dickerson, male   DOB: Sep 29, 1950, 73 y.o.   MRN: [id number] Patient seen and examined.  Right now he looks pretty good.  He is awake and alert with no obvious aphasia and no pronator drift.  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  He has a history of a fall in the last month or so.  He has waxing and waning symptoms.  We recommend a left craniotomy for evacuation of the subdural hematoma.  I gone over the procedure with the patient and his wife.  Of try to answer all of their questions to the best of my ability.  I have shown the imaging to the patient's wife.  They understand the risks of the surgery include but are not limited to bleeding, infection, stroke, brain injury, seizure, aphasia, numbness, weakness, paralysis, loss of bowel bladder or sexual function, need for further surgery, lack of relief of symptoms, worsening symptoms, and anesthesia risk.  They agree to proceed

## 2024-02-01 NOTE — Anesthesia Preprocedure Evaluation (Signed)
 Anesthesia Evaluation  Patient identified by MRN, date of birth, ID band Patient awake    Reviewed: Allergy & Precautions, NPO status , Patient's Chart, lab work & pertinent test resultsPreop documentation limited or incomplete due to emergent nature of procedure.  Airway Mallampati: I  TM Distance: >3 FB Neck ROM: Full    Dental  (+) Partial Lower, Partial Upper   Pulmonary    breath sounds clear to auscultation       Cardiovascular negative cardio ROS  Rhythm:Regular Rate:Normal  Echo:  Left ventricle: The cavity size was normal. There was mild    concentric hypertrophy. Systolic function was normal. The    estimated ejection fraction was in the range of 60% to 65%. Wall    motion was normal; there were no regional wall motion    abnormalities. Doppler parameters are consistent with abnormal    left ventricular relaxation (grade 1 diastolic dysfunction).    There was no evidence of elevated ventricular filling pressure by    Doppler parameters.  - Aortic valve: There was no regurgitation.  - Left atrium: The atrium was mildly dilated.  - Right ventricle: Systolic function was normal.  - Tricuspid valve: There was mild regurgitation.  - Pulmonary arteries: Systolic pressure was within the normal    range.  - Inferior vena cava: The vessel was normal in size.  - Pericardium, extracardiac: There was no pericardial effusion.     Neuro/Psych  Headaches TIA   GI/Hepatic negative GI ROS, Neg liver ROS,,,  Endo/Other    Renal/GU negative Renal ROS     Musculoskeletal   Abdominal   Peds  Hematology   Anesthesia Other Findings   Reproductive/Obstetrics                              Anesthesia Physical Anesthesia Plan  ASA: 4 and emergent  Anesthesia Plan: General   Post-op Pain Management: Ofirmev  IV (intra-op)*   Induction: Intravenous  PONV Risk Score and Plan: 3 and Ondansetron ,  Dexamethasone  and Treatment may vary due to age or medical condition  Airway Management Planned: Oral ETT  Additional Equipment: Arterial line  Intra-op Plan:   Post-operative Plan: Possible Post-op intubation/ventilation  Informed Consent: I have reviewed the patients History and Physical, chart, labs and discussed the procedure including the risks, benefits and alternatives for the proposed anesthesia with the patient or authorized representative who has indicated his/her understanding and acceptance.     Dental advisory given  Plan Discussed with: CRNA  Anesthesia Plan Comments:         Anesthesia Quick Evaluation

## 2024-02-01 NOTE — Op Note (Signed)
 02/01/2024  8:35 PM  PATIENT:  Edwin Dickerson  73 y.o. male  PRE-OPERATIVE DIAGNOSIS: Left subdural hematoma  POST-OPERATIVE DIAGNOSIS:  same  PROCEDURE: Left frontotemporoparietal craniotomy for evacuation of subdural hematoma  SURGEON:  Alm Molt, MD  ASSISTANTSBETHA pean, FNP  ANESTHESIA:   General  EBL: 25 ml  Total I/O In: 205 [IV Piggyback:205] Out: -   BLOOD ADMINISTERED: none  DRAINS: 10 flat JP  SPECIMEN:  none  INDICATION FOR PROCEDURE: This patient presented with difficulty with speech and cognition and intermittent right hemiparesis. Imaging showed a fairly large left-sided subdural hematoma. Recommended left craniotomy for evacuation of the subdural hematoma. Patient understood the risks, benefits, and alternatives and potential outcomes and wished to proceed.  PROCEDURE DETAILS: The patient was taken to the operating room and after induction of adequate generalized endotracheal anesthesia, the head was affixed in a 3 point Mayfield head rest, and turned to the right to expose the left frontotemporal parietal region. The head was shaved and then cleaned and then prepped with DuraPrep and draped in the usual sterile fashion. 10 cc of local anesthetic was injected, and a curvilinear incision was made on the left of the head. Raney clips were placed to establish hemostasis of the scalp, the muscle was reflected with the scalp flap, to expose the left frontotemporal parietal region. A burr hole was placed, and a craniotomy flap was turned utilizing the high-speed, air powered drill. The flap was then placed in bacitracin -containing saline solution, and the dura was opened to expose a large left-sided subdural hematoma which had both acute and chronic blood. A hematoma was then removed with a combination of irrigation and suction. I continued to irrigate until the irrigant was clear to, and dried any bleeding with bipolar cautery.  I then placed a subdural drain through separate  stab incision and close the dura with a running 4-0 Nurolon suture. Dural tack up sutures were placed. The dura was lined with Gelfoam, and the craniotomy flap was replaced with doggie-bone plates. The wound was copiously irrigated.  the galea was then closed with interrupted 2-0 Vicryl suture. The skin was then closed with staples a sterile dressing was applied. The patient was then taken out of the 3-point Mayfield headrest and awakened from general anesthesia, and transported to the recovery room in stable condition. At the end of the procedure all sponge, needle, and instrument counts were correct.    PLAN OF CARE: Admit to inpatient , 4 N. ICU  PATIENT DISPOSITION:  PACU - hemodynamically stable.   Delay start of Pharmacological VTE agent (>24hrs) due to surgical blood loss or risk of bleeding:  yes

## 2024-02-01 NOTE — Transfer of Care (Signed)
 Immediate Anesthesia Transfer of Care Note  Patient: Edwin Dickerson  Procedure(s) Performed: CRANIOTOMY HEMATOMA EVACUATION SUBDURAL (Left: Head)  Patient Location: PACU  Anesthesia Type:General  Level of Consciousness: awake, alert , and oriented  Airway & Oxygen Therapy: Patient Spontanous Breathing and Patient connected to face mask oxygen  Post-op Assessment: Report given to RN and Post -op Vital signs reviewed and stable  Post vital signs: Reviewed and stable  Last Vitals:  Vitals Value Taken Time  BP 149/75 02/01/24 20:49  Temp 36.4 C 02/01/24 20:49  Pulse 85 02/01/24 20:56  Resp 15 02/01/24 20:56  SpO2 95 % 02/01/24 20:56  Vitals shown include unfiled device data.  Last Pain:  Vitals:   02/01/24 1907  TempSrc: Oral  PainSc: 3       Patients Stated Pain Goal: 0 (02/01/24 1907)  Complications: No notable events documented.

## 2024-02-01 NOTE — Anesthesia Procedure Notes (Signed)
 Procedure Name: Intubation Date/Time: 02/01/2024 7:42 PM  Performed by: Mannie Krystal LABOR, CRNAPre-anesthesia Checklist: Patient identified, Emergency Drugs available, Suction available and Patient being monitored Patient Re-evaluated:Patient Re-evaluated prior to induction Oxygen Delivery Method: Circle system utilized Preoxygenation: Pre-oxygenation with 100% oxygen Induction Type: IV induction and Rapid sequence Laryngoscope Size: Mac and 4 Grade View: Grade I Tube type: Oral Tube size: 8.0 mm Number of attempts: 1 Airway Equipment and Method: Stylet Placement Confirmation: ETT inserted through vocal cords under direct vision, positive ETCO2 and breath sounds checked- equal and bilateral Secured at: 22 cm Tube secured with: Tape Dental Injury: Teeth and Oropharynx as per pre-operative assessment

## 2024-02-01 NOTE — ED Triage Notes (Signed)
 Pt reported having right sided weakness and slurred speech for several days. Right facial droop noted. Wife reports they initially thought this was related to a recent eye surgery he had but it has not gotten any better. Difficulty walking.  No pain noted

## 2024-02-01 NOTE — Consult Note (Signed)
 NAME:  Edwin Dickerson, MRN:  [id number], DOB:  06-08-1950, LOS: 0 ADMISSION DATE:  02/01/2024, CONSULTATION DATE:  9/3 REFERRING MD:  Dr. Joshua, CHIEF COMPLAINT:  SDH   History of Present Illness:  Patient is encephalopathic and/or intubated; therefore, history has been obtained from chart review.  73 year old male with past medical history as below, which is significant for TIA and hyperlipidemia.  Reportedly fell out of bed earlier this summer and did strike his head on the nightstand.  Afterwards had a small headache but no real symptoms.  Around the end of August he developed some forgetfulness and word finding difficulty as well as progressive weakness in the right upper and lower extremities.  He presented to the [facility] emergency department 9/3 with these complaints.  CT of the head demonstrated subdural hematoma with midline shift.  Neurosurgery was consulted and took the patient urgently to the operating room for craniotomy and evacuation.  Postoperatively the patient was sent to the neuro ICU for recovery with JP drain in place.  PCCM asked to assist with medical management.   Pertinent  Medical History   has a past medical history of High cholesterol and TIA (transient ischemic attack).   Significant Hospital Events: Including procedures, antibiotic start and stop dates in addition to other pertinent events     Interim History / Subjective:  No complaints currently, having word finding difficulty and word salad.   Objective    Blood pressure 133/66, pulse 61, temperature 98.3 F (36.8 C), temperature source Oral, resp. rate 15, height 5' 7 (1.702 m), weight 90.7 kg, SpO2 92%.        Intake/Output Summary (Last 24 hours) at 02/01/2024 2247 Last data filed at 02/01/2024 2130 Gross per 24 hour  Intake 1205 ml  Output 75 ml  Net 1130 ml   Filed Weights   02/01/24 1530  Weight: 90.7 kg    Examination: General: Elderly male of normal body habitus in NAD HENT: Postoperative  changed. JP drain with approximately 10cc of bloody output.  Lungs: Clear Cardiovascular: RRR, no MRG Abdomen: Soft, NT, ND Extremities: No acute deformity Neuro: Alert, oriented x 3. Answers brief questions OK, but anything longer than a couple of words becomes word salad. Moving all extremities with 5/5 strength.   Resolved problem list   Assessment and Plan   Subdural hematoma on the left: s/p crani and evacuation 9/3 - Monitor postoperatively in ICU - Keep SBP 130-154mmHg - PRN hydralazine  - Neurochecks per unit protocol - Keppra  for seizure ppx - multimodal pain control - Decadron  taper. - Follow up CT head 9/4 AM  HLD - resume home atorvastatin  assuming he passes bedside swallow.   Labs   CBC: Recent Labs  Lab 02/01/24 1552 02/01/24 1558  WBC 8.9  --   NEUTROABS 7.4  --   HGB 14.6 13.6  HCT 41.5 40.0  MCV 87.2  --   PLT 215  --     Basic Metabolic Panel: Recent Labs  Lab 02/01/24 1552 02/01/24 1558  NA 141 139  K 3.9 3.9  CL 106 106  CO2 20*  --   GLUCOSE 100* 102*  BUN 15 16  CREATININE 1.11 1.10  CALCIUM  9.1  --    GFR: Estimated Creatinine Clearance: 62.3 mL/min (by C-G formula based on SCr of 1.1 mg/dL). Recent Labs  Lab 02/01/24 1552  WBC 8.9    Liver Function Tests: Recent Labs  Lab 02/01/24 1552  AST 21  ALT 22  ALKPHOS  71  BILITOT 2.3*  PROT 6.7  ALBUMIN 4.2   No results for input(s): LIPASE, AMYLASE in the last 168 hours. No results for input(s): AMMONIA in the last 168 hours.  ABG    Component Value Date/Time   TCO2 21 (L) 02/01/2024 1558     Coagulation Profile: Recent Labs  Lab 02/01/24 1552  INR 1.0    Cardiac Enzymes: No results for input(s): CKTOTAL, CKMB, CKMBINDEX, TROPONINI in the last 168 hours.  HbA1C: Hgb A1c MFr Bld  Date/Time Value Ref Range Status  10/23/2016 02:21 AM 5.1 4.8 - 5.6 % Final    Comment:    (NOTE)         Pre-diabetes: 5.7 - 6.4         Diabetes: >6.4          Glycemic control for adults with diabetes: <7.0     CBG: No results for input(s): GLUCAP in the last 168 hours.  Review of Systems:    Bolds are positive  Constitutional: weight loss, gain, night sweats, Fevers, chills, fatigue .  HEENT: headaches, Sore throat, sneezing, nasal congestion, post nasal drip, Difficulty swallowing, Tooth/dental problems, visual complaints visual changes, ear ache CV:  chest pain, radiates:,Orthopnea, PND, swelling in lower extremities, dizziness, palpitations, syncope.  GI  heartburn, indigestion, abdominal pain, nausea, vomiting, diarrhea, change in bowel habits, loss of appetite, bloody stools.  Resp: cough, productive: , hemoptysis, dyspnea, chest pain, pleuritic.  Skin: rash or itching or icterus GU: dysuria, change in color of urine, urgency or frequency. flank pain, hematuria  MS: joint pain or swelling. decreased range of motion  Psych: change in mood or affect. depression or anxiety.  Neuro: difficulty with speech, weakness, numbness, ataxia    Past Medical History:  He,  has a past medical history of High cholesterol and TIA (transient ischemic attack).   Surgical History:   Past Surgical History:  Procedure Laterality Date   PARS PLANA REPAIR OF RETINAL DEATACHMENT       Social History:   reports that he has never smoked. He has never used smokeless tobacco. He reports that he does not drink alcohol and does not use drugs.   Family History:  His family history is not on file.   Allergies Allergies  Allergen Reactions   Penicillins Hives, Rash and Other (See Comments)    blacked out Has patient had a PCN reaction causing immediate rash, facial/tongue/throat swelling, SOB or lightheadedness with hypotension: Yes Has patient had a PCN reaction causing severe rash involving mucus membranes or skin necrosis: Yes Has patient had a PCN reaction that required hospitalization: No Has patient had a PCN reaction occurring within the last  10 years: No If all of the above answers are NO, then may proceed with Cephalosporin use.      Home Medications  Prior to Admission medications   Medication Sig Start Date End Date Taking? Authorizing Provider  atorvastatin  (LIPITOR) 40 MG tablet Take 1 tablet (40 mg total) by mouth daily at 6 PM. 11/01/16   Kathrin Mignon DASEN, MD     Critical care time: 38 min     Deward Eastern, AGACNP-BC [facility]  See Amion for personal pager PCCM on call pager [phone] until 7pm. Please call Elink 7p-7a. [phone]  02/01/2024 11:00 PM

## 2024-02-01 NOTE — ED Provider Triage Note (Signed)
 Emergency Medicine Provider Triage Evaluation Note  Kelton Bultman , a 73 y.o. male  was evaluated in triage.  Pt complains of right sided weakness and slurred speech over the last 3 days.  He comes in with his wife who states that initially he had some difficulty with fine motor tasks such as pouring liquids into a glass.  Over the past several days this has progressed to where he has had slurred speech today as well as seemingly dragging his feet while walking, seeming unsteady.  Further, they endorse that he is had increasing confusion, with difficulty recalling specific information as well as difficulty understanding and being able to complete simple tasks..  Review of Systems  Positive: As above Negative:   Physical Exam  BP (!) 145/86   Pulse 63   Temp 98.3 F (36.8 C)   Resp 20   Ht 5' 7 (1.702 m)   Wt 90.7 kg   SpO2 95%   BMI 31.32 kg/m  Gen:   Awake, no distress   Resp:  Normal effort  MSK:   Moves extremities without difficulty  Other:  Cranial nerves are grossly intact, he does have difficulty and completing details of assessment, unable to assess for dysmetria secondary to the fact that he is unable to understanding complete simple commands.  Seems acutely confused.  Oriented to person however disoriented to place time and event.  Medical Decision Making  Medically screening exam initiated at 3:36 PM.  Appropriate orders placed.  Hector Lemma was informed that the remainder of the evaluation will be completed by another provider, this initial triage assessment does not replace that evaluation, and the importance of remaining in the ED until their evaluation is complete.  Based on stroke symptoms over the last 3 days, begin stroke workup however will not activate code stroke secondary to onset of symptoms greater than 3 days.   Myriam Dorn BROCK[place], [place] 02/01/24 1550

## 2024-02-02 ENCOUNTER — Inpatient Hospital Stay (HOSPITAL_COMMUNITY)

## 2024-02-02 DIAGNOSIS — Z9889 Other specified postprocedural states: Secondary | ICD-10-CM | POA: Diagnosis not present

## 2024-02-02 MED ORDER — LEVETIRACETAM 500 MG PO TABS
500.0000 mg | ORAL_TABLET | Freq: Two times a day (BID) | ORAL | Status: DC
  Administered 2024-02-02 – 2024-02-06 (×9): 500 mg via ORAL
  Filled 2024-02-02 (×9): qty 1

## 2024-02-02 MED ORDER — PANTOPRAZOLE SODIUM 40 MG PO TBEC
40.0000 mg | DELAYED_RELEASE_TABLET | Freq: Every day | ORAL | Status: DC
  Administered 2024-02-02 – 2024-02-05 (×4): 40 mg via ORAL
  Filled 2024-02-02 (×4): qty 1

## 2024-02-02 NOTE — Progress Notes (Signed)
  Inpatient Rehab Admissions Coordinator :  Per therapy recommendations, patient was screened for CIR candidacy by Ottie Glazier RN MSN.  At this time patient appears to be a potential candidate for CIR. I will place a rehab consult per protocol for full assessment. Please call me with any questions.  Ottie Glazier RN MSN Admissions Coordinator [phone]

## 2024-02-02 NOTE — Progress Notes (Signed)
 Patient ID: Edwin Dickerson, male   DOB: 1950-08-20, 73 y.o.   MRN: [id number] Subjective: Patient reports he is doing okay.  Objective: Vital signs in last 24 hours: Temp:  [97.5 F (36.4 C)-99.3 F (37.4 C)] 99.3 F (37.4 C) (09/04 0803) Pulse Rate:  [52-82] 68 (09/04 0730) Resp:  [11-26] 13 (09/04 0730) BP: (109-196)/(56-95) 115/65 (09/04 0730) SpO2:  [91 %-98 %] 92 % (09/04 0730) Arterial Line BP: (146-193)/(72-94) 162/89 (09/03 2341) Weight:  [90.7 kg] 90.7 kg (09/03 1530)  Intake/Output from previous day: 09/03 0701 - 09/04 0700 In: 1210 [I.V.:1000; IV Piggyback:210] Out: 415 [Urine:250; Drains:140; Blood:25] Intake/Output this shift: No intake/output data recorded.  He is awake and alert and follows commands though I think there is some disorientation and confusion still.  I cannot pick up an aphasia.  Incision is okay.  Lab Results: Lab Results  Component Value Date   WBC 8.9 02/01/2024   HGB 13.6 02/01/2024   HCT 40.0 02/01/2024   MCV 87.2 02/01/2024   PLT 215 02/01/2024   Lab Results  Component Value Date   INR 1.0 02/01/2024   BMET Lab Results  Component Value Date   NA 139 02/01/2024   K 3.9 02/01/2024   CL 106 02/01/2024   CO2 20 (L) 02/01/2024   GLUCOSE 102 (H) 02/01/2024   BUN 16 02/01/2024   CREATININE 1.10 02/01/2024   CALCIUM  9.1 02/01/2024    Studies/Results: CT HEAD WO CONTRAST Result Date: 02/02/2024 EXAM: CT HEAD WITHOUT CONTRAST 02/02/2024 06:21:21 AM TECHNIQUE: CT of the head was performed without the administration of intravenous contrast. Automated exposure control, iterative reconstruction, and/or weight based adjustment of the mA/kV was utilized to reduce the radiation dose to as low as reasonably achievable. COMPARISON: CT of the head dated 02/01/2024. CLINICAL HISTORY: Subdural hematoma. FINDINGS: BRAIN AND VENTRICLES: Since the previous study, the patient has undergone left frontoparietal craniotomy for evacuation of a left subdural  hematoma. A drainage catheter has been placed in the interim. There is new subarachnoid and subdural hemorrhage present. Overall, the subdural hematoma is slightly decreased in maximal thickness on the coronal images from 22 mm to 20 mm. There has been mild interval improvement of shift of midline structures to the right, now measuring 4 mm. Previously the midline shift measured up to 8 mm. ORBITS: No acute abnormality. SINUSES: No acute abnormality. SOFT TISSUES AND SKULL: No acute soft tissue abnormality. Postoperative changes of left frontoparietal craniotomy. IMPRESSION: 1. Status post left frontoparietal craniotomy for evacuation of a left subdural hematoma with placement of a drainage catheter. 2. New subarachnoid and subdural hemorrhage. 3. Slight decrease in maximal thickness of the subdural hematoma from 22 mm to 20 mm. 4. Mild interval improvement of shift of midline structures to the right, now measuring 4 mm (previously 8 mm). Electronically signed by: Edwin Coho MD 02/02/2024 06:50 AM EDT RP Workstation: [id number]   CT HEAD WO CONTRAST Addendum Date: 02/01/2024 ADDENDUM REPORT: 02/01/2024 17:18 ADDENDUM: Impression #1 called by telephone on 02/01/2024 at 5:17 pm to provider Dr. Neysa, who verbally acknowledged these results. Electronically Signed   By: Edwin Dickerson D.O.   On: 02/01/2024 17:18   Result Date: 02/01/2024 CLINICAL DATA:  Provided history: Neuro deficit, acute, stroke suspected. Right-sided weakness. Slurred speech. Right-sided facial droop. EXAM: CT HEAD WITHOUT CONTRAST TECHNIQUE: Contiguous axial images were obtained from the base of the skull through the vertex without intravenous contrast. RADIATION DOSE REDUCTION: This exam was performed according to the departmental dose-optimization program which  includes automated exposure control, adjustment of the mA and/or kV according to patient size and/or use of iterative reconstruction technique. COMPARISON:  Brain MRI 10/23/2016.  FINDINGS: Brain: Mild generalized cerebral atrophy. Mixed density subdural hematoma centered along the left frontal convexity (measuring up to 2.3 cm in thickness). The subdural hematoma contains low-density, intermediate density and hyperdense components. Mass effect upon the underlying brain parenchyma with 8 mm rightward midline shift. The basal cisterns remain patent. No demarcated cortical infarct. No evidence of an intracranial mass. Vascular: No hyperdense vessel.  Atherosclerotic calcifications. Skull: No calvarial fracture or aggressive osseous lesion. Sinuses/Orbits: No mass or acute finding within the imaged orbits. No significant paranasal sinus disease at the imaged levels. Attempts are being made to reach the ordering provider at this time. IMPRESSION: 1. Mixed density subdural hematoma centered along the left frontal convexity (measuring up to 2.3 mm in thickness). Mass effect upon the underlying brain parenchyma with 8 mm rightward midline shift. 2. Mild cerebral atrophy. Electronically Signed: By: Edwin Dickerson D.O. On: 02/01/2024 17:13    Assessment/Plan: Overall he looks okay this morning.  CT scan does have some subarachnoid hemorrhage and subdural hematoma though I do not think it is under any pressure and does not need evacuation at this time.  Will leave JP drain in place.  Mobilize with PT and OT today.  Will see how he does through the day today.  His catheter  Estimated body mass index is 31.32 kg/m as calculated from the following:   Height as of this encounter: 5' 7 (1.702 m).   Weight as of this encounter: 90.7 kg.    LOS: 1 day    Edwin Dickerson 02/02/2024, 8:09 AM

## 2024-02-02 NOTE — Plan of Care (Signed)
 Patient oriented to self, difficult to further assess orientation due to expressive aphasia. Patient shows improvement in word finding throughout shift, able to identify objects like watch and scissor and say correct words for each. NSR. BP within goal throughout shift. No BM this shift. Adequate UOP through condom catheter. Patient able to reposition self and does so frequently. Spent ~4 hours in chair this shift. Patient declined CHG bath this afternoon, states he would like it done later.  Problem: Education: Goal: Knowledge of General Education information will improve Description: Including pain rating scale, medication(s)/side effects and non-pharmacologic comfort measures Outcome: Progressing   Problem: Health Behavior/Discharge Planning: Goal: Ability to manage health-related needs will improve Outcome: Progressing   Problem: Clinical Measurements: Goal: Ability to maintain clinical measurements within normal limits will improve Outcome: Progressing Goal: Will remain free from infection Outcome: Progressing Goal: Diagnostic test results will improve Outcome: Progressing Goal: Respiratory complications will improve Outcome: Progressing Goal: Cardiovascular complication will be avoided Outcome: Progressing   Problem: Activity: Goal: Risk for activity intolerance will decrease Outcome: Progressing   Problem: Nutrition: Goal: Adequate nutrition will be maintained Outcome: Progressing   Problem: Coping: Goal: Level of anxiety will decrease Outcome: Progressing   Problem: Elimination: Goal: Will not experience complications related to bowel motility Outcome: Progressing Goal: Will not experience complications related to urinary retention Outcome: Progressing   Problem: Pain Managment: Goal: General experience of comfort will improve and/or be controlled Outcome: Progressing   Problem: Safety: Goal: Ability to remain free from injury will improve Outcome:  Progressing   Problem: Skin Integrity: Goal: Risk for impaired skin integrity will decrease Outcome: Progressing   Problem: Education: Goal: Knowledge of the prescribed therapeutic regimen will improve Outcome: Progressing   Problem: Clinical Measurements: Goal: Usual level of consciousness will be regained or maintained. Outcome: Progressing Goal: Neurologic status will improve Outcome: Progressing Goal: Ability to maintain intracranial pressure will improve Outcome: Progressing   Problem: Skin Integrity: Goal: Demonstration of wound healing without infection will improve Outcome: Progressing

## 2024-02-02 NOTE — Consult Note (Addendum)
 NAME:  Edwin Dickerson, MRN:  [id number], DOB:  February 26, 1951, LOS: 1 ADMISSION DATE:  02/01/2024, CONSULTATION DATE:  9/3 REFERRING MD:  Dr. Joshua, CHIEF COMPLAINT:  SDH   History of Present Illness:  Patient is encephalopathic and/or intubated; therefore, history has been obtained from chart review.  73 year old male with past medical history as below, which is significant for TIA and hyperlipidemia.  Reportedly fell out of bed earlier this summer and did strike his head on the nightstand.  Afterwards had a small headache but no real symptoms.  Around the end of August he developed some forgetfulness and word finding difficulty as well as progressive weakness in the right upper and lower extremities.  He presented to the [facility] emergency department 9/3 with these complaints.  CT of the head demonstrated subdural hematoma with midline shift.  Neurosurgery was consulted and took the patient urgently to the operating room for craniotomy and evacuation.  Postoperatively the patient was sent to the neuro ICU for recovery with JP drain in place.  PCCM asked to assist with medical management.   Pertinent  Medical History   has a past medical history of High cholesterol and TIA (transient ischemic attack).   Significant Hospital Events: Including procedures, antibiotic start and stop dates in addition to other pertinent events     Interim History / Subjective:  Per family hit his head on side table a month ago, noticed weakness and some mental status changes 2 weeks ago with worsening over last 2 to 3 days which prompted them to come to the hospital. Status post subdural hematoma evacuation.  No change in mental status since then.  Currently is alert and oriented x 1-2.  Able to remember distant things but unable to remember current events.  Objective    Blood pressure 139/74, pulse 68, temperature 99.3 F (37.4 C), temperature source Oral, resp. rate 15, height 5' 7 (1.702 m), weight 90.7 kg, SpO2 92%.         Intake/Output Summary (Last 24 hours) at 02/02/2024 1153 Last data filed at 02/02/2024 0800 Gross per 24 hour  Intake 1210 ml  Output 435 ml  Net 775 ml   Filed Weights   02/01/24 1530  Weight: 90.7 kg    Examination: General: Elderly male of normal body habitus in NAD HENT: JP drain in place. Lungs: Clear to auscultation bilaterally. Cardiovascular: RRR, no MRG Abdomen: Soft, NT, ND Neuro: Alert, oriented x 1-2.  Strength 4/5 on right upper and lower extremity and 5/5 in left upper and lower extremity.  Sometimes not responding appropriately.  Resolved problem list   Assessment and Plan   Subdural hematoma on the left: s/p crani and evacuation 9/3 CT head with left SDH and midline shift.  Repeat CT head 9/4 with decrease in SDH from 22 to 20 mm and improvement in midline shift from 8 to 4 mm.  However there is new subarachnoid and SDH. -Neurosurgery primary. - Keep SBP 130-193mmHg - PRN hydralazine  - Neurochecks per unit protocol - Keppra  for seizure ppx - multimodal pain control - Decadron  taper per neurosurgery  HLD - Lipitor.  Labs   CBC: Recent Labs  Lab 02/01/24 1552 02/01/24 1558  WBC 8.9  --   NEUTROABS 7.4  --   HGB 14.6 13.6  HCT 41.5 40.0  MCV 87.2  --   PLT 215  --     Basic Metabolic Panel: Recent Labs  Lab 02/01/24 1552 02/01/24 1558  NA 141 139  K 3.9  3.9  CL 106 106  CO2 20*  --   GLUCOSE 100* 102*  BUN 15 16  CREATININE 1.11 1.10  CALCIUM  9.1  --    GFR: Estimated Creatinine Clearance: 62.3 mL/min (by C-G formula based on SCr of 1.1 mg/dL). Recent Labs  Lab 02/01/24 1552  WBC 8.9    Liver Function Tests: Recent Labs  Lab 02/01/24 1552  AST 21  ALT 22  ALKPHOS 71  BILITOT 2.3*  PROT 6.7  ALBUMIN 4.2   No results for input(s): LIPASE, AMYLASE in the last 168 hours. No results for input(s): AMMONIA in the last 168 hours.  ABG    Component Value Date/Time   TCO2 21 (L) 02/01/2024 1558     Coagulation  Profile: Recent Labs  Lab 02/01/24 1552  INR 1.0    Cardiac Enzymes: No results for input(s): CKTOTAL, CKMB, CKMBINDEX, TROPONINI in the last 168 hours.  HbA1C: Hgb A1c MFr Bld  Date/Time Value Ref Range Status  10/23/2016 02:21 AM 5.1 4.8 - 5.6 % Final    Comment:    (NOTE)         Pre-diabetes: 5.7 - 6.4         Diabetes: >6.4         Glycemic control for adults with diabetes: <7.0     CBG: No results for input(s): GLUCAP in the last 168 hours.  Past Medical History:  He,  has a past medical history of High cholesterol and TIA (transient ischemic attack).   Surgical History:   Past Surgical History:  Procedure Laterality Date   PARS PLANA REPAIR OF RETINAL DEATACHMENT       Social History:   reports that he has never smoked. He has never used smokeless tobacco. He reports that he does not drink alcohol and does not use drugs.   Family History:  His family history is not on file.   Allergies Allergies  Allergen Reactions   Penicillins Hives, Rash and Other (See Comments)    blacked out Has patient had a PCN reaction causing immediate rash, facial/tongue/throat swelling, SOB or lightheadedness with hypotension: Yes Has patient had a PCN reaction causing severe rash involving mucus membranes or skin necrosis: Yes Has patient had a PCN reaction that required hospitalization: No Has patient had a PCN reaction occurring within the last 10 years: No If all of the above answers are NO, then may proceed with Cephalosporin use.      Home Medications  Prior to Admission medications   Medication Sig Start Date End Date Taking? Authorizing Provider  atorvastatin  (LIPITOR) 40 MG tablet Take 1 tablet (40 mg total) by mouth daily at 6 PM. 11/01/16   Kathrin Mignon DASEN, MD    CRITICAL CARE Performed by: Sammi JONETTA Fredericks.     Total critical care time: NA minutes   Critical care time was exclusive of separately billable procedures and treating other patients.    Critical care was necessary to treat or prevent imminent or life-threatening deterioration.   Critical care was time spent personally by me on the following activities: development of treatment plan with patient and/or surrogate as well as nursing, discussions with consultants, evaluation of patient's response to treatment, examination of patient, obtaining history from patient or surrogate, ordering and performing treatments and interventions, ordering and review of laboratory studies, ordering and review of radiographic studies, pulse oximetry, re-evaluation of patient's condition and participation in multidisciplinary rounds.  Sammi JONETTA Fredericks, MD Pulmonary, Critical Care and Sleep Attending.  Pager: [phone]  02/02/2024, 11:57 AM

## 2024-02-02 NOTE — Evaluation (Signed)
 Occupational Therapy Evaluation Patient Details Name: Edwin Dickerson MRN: [id number] DOB: 04-Aug-1950 Today's Date: 02/02/2024   History of Present Illness   73 year old male admitted 9/3 with progressive confusion and right sided weakness over the last couple days. Patient hit his head about 4 weeks ago, falling out of bed striking his head.  PMH includes: TIA.  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  S/p Left frontotemporoparietal craniotomy for evacuation of subdural hematoma on 9/3.  JP Drain in place.     Clinical Impressions Patient admitted for the diagnosis above.  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  Patient needed no assist with ADL,iADL or mobility.  Currently he presents with word finding difficulties, reduced command following, poor dynamic balance and dizziness with positional changes.  Patient's SBP did fall from 139 to 110 with supine to sit, but did normalize to around 125.  Patient needing up to Min A for simple transfers and Mod A for ADL completion.  This represents a significant reduction from his baseline function, and Patient will benefit from intensive inpatient follow-up therapy, >3 hours/day.  OT will follow in the acute setting to address deficits.       If plan is discharge home, recommend the following:   A lot of help with walking and/or transfers;A lot of help with bathing/dressing/bathroom;Direct supervision/assist for medications management;Direct supervision/assist for financial management;Assist for transportation;Help with stairs or ramp for entrance;Supervision due to cognitive status     Functional Status Assessment   Patient has had a recent decline in their functional status and demonstrates the ability to make significant improvements in function in a reasonable and predictable amount of time.     Equipment Recommendations   Tub/shower bench     Recommendations for Other  Services   Rehab consult     Precautions/Restrictions   Precautions Precautions: Fall Recall of Precautions/Restrictions: Impaired Precaution/Restrictions Comments: Watch SBP Restrictions Weight Bearing Restrictions Per Provider Order: No     Mobility Bed Mobility Overal bed mobility: Needs Assistance Bed Mobility: Supine to Sit     Supine to sit: Min assist          Transfers Overall transfer level: Needs assistance   Transfers: Sit to/from Stand, Bed to chair/wheelchair/BSC Sit to Stand: Min assist     Step pivot transfers: Min assist     General transfer comment: Increased time for command following, unsteady.      Balance Overall balance assessment: Needs assistance Sitting-balance support: Feet supported Sitting balance-Leahy Scale: Fair     Standing balance support: Single extremity supported Standing balance-Leahy Scale: Poor                             ADL either performed or assessed with clinical judgement   ADL Overall ADL's : Needs assistance/impaired Eating/Feeding: Set up;Sitting   Grooming: Wash/dry hands;Wash/dry face;Contact guard assist;Sitting   Upper Body Bathing: Minimal assistance;Sitting   Lower Body Bathing: Moderate assistance;Sit to/from stand   Upper Body Dressing : Moderate assistance;Sitting   Lower Body Dressing: Moderate assistance;Sit to/from stand   Toilet Transfer: Minimal assistance;Stand-pivot;BSC/3in1                   Vision   Vision Assessment?: No apparent visual deficits Additional Comments: Tends to keep L eye closed     Perception Perception: Within Functional Limits       Praxis  Praxis: Impaired Praxis Impairment Details: Perseveration, Motor planning     Pertinent Vitals/Pain Pain Assessment Pain Assessment: No/denies pain     Extremity/Trunk Assessment Upper Extremity Assessment Upper Extremity Assessment: Overall WFL for tasks assessed;Right hand dominant   Lower  Extremity Assessment Lower Extremity Assessment: Defer to PT evaluation   Cervical / Trunk Assessment Cervical / Trunk Assessment: Normal   Communication Communication Communication: Impaired Factors Affecting Communication: Difficulty expressing self   Cognition Arousal: Alert Behavior During Therapy: WFL for tasks assessed/performed Cognition: Cognition impaired   Orientation impairments: Place, Time, Situation   Memory impairment (select all impairments): Working memory Attention impairment (select first level of impairment): Focused attention Executive functioning impairment (select all impairments): Organization, Sequencing, Reasoning, Problem solving                   Following commands: Impaired Following commands impaired: Follows one step commands inconsistently     Cueing  General Comments   Cueing Techniques: Verbal cues;Gestural cues;Tactile cues   No BP parameters   Exercises     Shoulder Instructions      Home Living Family/patient expects to be discharged to:: Private residence Living Arrangements: Spouse/significant other Available Help at Discharge: Family;Available 24 hours/day Type of Home: House Home Access: Stairs to enter     Home Layout: Two level;Able to live on main level with bedroom/bathroom     Bathroom Shower/Tub: Chief Strategy Officer: Standard Bathroom Accessibility: Yes How Accessible: Accessible via walker Home Equipment: None          Prior Functioning/Environment Prior Level of Function : Independent/Modified Independent;Driving             Mobility Comments: Walks without an AD.  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 ADLs Comments: Ind with ADL and iADL.    OT Problem List: Decreased activity tolerance;Impaired balance (sitting and/or standing);Decreased coordination;Decreased knowledge of precautions;Decreased safety  awareness;Impaired UE functional use   OT Treatment/Interventions: Self-care/ADL training;Therapeutic activities;Cognitive remediation/compensation;Patient/family education;Balance training;DME and/or AE instruction      OT Goals(Current goals can be found in the care plan section)   Acute Rehab OT Goals Patient Stated Goal: Return home OT Goal Formulation: With patient Time For Goal Achievement: 02/16/24 Potential to Achieve Goals: Good ADL Goals Pt Will Perform Grooming: with supervision;standing Pt Will Perform Upper Body Dressing: with supervision;sitting Pt Will Perform Lower Body Dressing: with contact guard assist;sit to/from stand Pt Will Transfer to Toilet: with contact guard assist;ambulating;regular height toilet   OT Frequency:  Min 2X/week    Co-evaluation              AM-PAC OT 6 Clicks Daily Activity     Outcome Measure Help from another person eating meals?: A Little Help from another person taking care of personal grooming?: A Little Help from another person toileting, which includes using toliet, bedpan, or urinal?: A Lot Help from another person bathing (including washing, rinsing, drying)?: A Lot Help from another person to put on and taking off regular upper body clothing?: A Lot Help from another person to put on and taking off regular lower body clothing?: A Lot 6 Click Score: 14   End of Session Equipment Utilized During Treatment: Gait belt Nurse Communication: Mobility status  Activity Tolerance: Patient tolerated treatment well Patient left: in chair;with call bell/phone within reach;with chair alarm set;with family/visitor present  OT Visit Diagnosis: Unsteadiness on feet (R26.81);Cognitive communication deficit (R41.841) Symptoms and  signs involving cognitive functions: Nontraumatic SAH                Time: 1140-1205 OT Time Calculation (min): 25 min Charges:  OT General Charges $OT Visit: 1 Visit OT Evaluation $OT Eval Moderate  Complexity: 1 Mod OT Treatments $Self Care/Home Management : 8-22 mins  02/02/2024  RP, OTR/L  Acute Rehabilitation Services  Office:  [phone]   Charlie JONETTA Halsted 02/02/2024, 12:45 PM

## 2024-02-03 ENCOUNTER — Inpatient Hospital Stay (HOSPITAL_COMMUNITY)

## 2024-02-03 ENCOUNTER — Encounter (HOSPITAL_COMMUNITY): Payer: Self-pay | Admitting: Neurological Surgery

## 2024-02-03 DIAGNOSIS — E785 Hyperlipidemia, unspecified: Secondary | ICD-10-CM

## 2024-02-03 DIAGNOSIS — I6200 Nontraumatic subdural hemorrhage, unspecified: Secondary | ICD-10-CM

## 2024-02-03 DIAGNOSIS — Z48811 Encounter for surgical aftercare following surgery on the nervous system: Secondary | ICD-10-CM

## 2024-02-03 LAB — PHOSPHORUS: Phosphorus: 3.3 mg/dL (ref 2.5–4.6)

## 2024-02-03 LAB — CBC
HCT: 38.3 % — ABNORMAL LOW (ref 39.0–52.0)
Hemoglobin: 13.4 g/dL (ref 13.0–17.0)
MCH: 31.1 pg (ref 26.0–34.0)
MCHC: 35 g/dL (ref 30.0–36.0)
MCV: 88.9 fL (ref 80.0–100.0)
Platelets: 187 K/uL (ref 150–400)
RBC: 4.31 MIL/uL (ref 4.22–5.81)
RDW: 12.8 % (ref 11.5–15.5)
WBC: 15.4 K/uL — ABNORMAL HIGH (ref 4.0–10.5)
nRBC: 0 % (ref 0.0–0.2)

## 2024-02-03 LAB — BASIC METABOLIC PANEL WITH GFR
Anion gap: 12 (ref 5–15)
BUN: 16 mg/dL (ref 8–23)
CO2: 21 mmol/L — ABNORMAL LOW (ref 22–32)
Calcium: 9.2 mg/dL (ref 8.9–10.3)
Chloride: 106 mmol/L (ref 98–111)
Creatinine, Ser: 1.09 mg/dL (ref 0.61–1.24)
GFR, Estimated: >60 mL/min (ref >60)
Glucose, Bld: 184 mg/dL — ABNORMAL HIGH (ref 70–99)
Potassium: 4 mmol/L (ref 3.5–5.1)
Sodium: 139 mmol/L (ref 135–145)

## 2024-02-03 LAB — MAGNESIUM: Magnesium: 2 mg/dL (ref 1.7–2.4)

## 2024-02-03 NOTE — Progress Notes (Signed)
 Occupational Therapy Treatment Patient Details Name: Edwin Dickerson MRN: [id number] DOB: [birth year] Today's Date: 02/03/2024   History of present illness 73 year old male admitted 9/3 with progressive confusion and right sided weakness over the last couple days. Patient hit his head about 4 weeks ago, falling out of bed striking his head.  PMH includes: TIA.  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  S/p Left frontotemporoparietal craniotomy for evacuation of subdural hematoma on 9/3.   OT comments  Patient with JP Drain removed, CT Head post removal:  9/5: Postoperative changes from evacuation of left subdural hematoma. The subdural hematoma overall appears a little smaller compared with yesterday. The 4 mm midline shift is unchanged.  Patient, per spouse, increased word finding difficulty, decreasing upper body coordination, poor sequencing with grooming task, but able to walk in the room with HHA and gait belt.  OT will continue efforts in the acute setting, Patient will benefit from intensive inpatient follow-up therapy, >3 hours/day.      If plan is discharge home, recommend the following:  A lot of help with walking and/or transfers;A lot of help with bathing/dressing/bathroom;Direct supervision/assist for medications management;Direct supervision/assist for financial management;Assist for transportation;Help with stairs or ramp for entrance;Supervision due to cognitive status   Equipment Recommendations  Tub/shower bench    Recommendations for Other Services      Precautions / Restrictions Precautions Precautions: Fall Recall of Precautions/Restrictions: Impaired Precaution/Restrictions Comments: Watch SBP Restrictions Weight Bearing Restrictions Per Provider Order: No       Mobility Bed Mobility                    Transfers Overall transfer level: Needs assistance   Transfers: Sit to/from Stand, Bed to chair/wheelchair/BSC Sit  to Stand: Contact guard assist     Step pivot transfers: Min assist     General transfer comment: Increased time for command following, unsteady.     Balance Overall balance assessment: Needs assistance Sitting-balance support: Feet supported Sitting balance-Leahy Scale: Fair     Standing balance support: Single extremity supported Standing balance-Leahy Scale: Poor                             ADL either performed or assessed with clinical judgement   ADL   Eating/Feeding: Moderate assistance;Sitting   Grooming: Moderate assistance;Standing   Upper Body Bathing: Moderate assistance;Sitting           Lower Body Dressing: Maximal assistance;Sit to/from stand                      Extremity/Trunk Assessment Upper Extremity Assessment Upper Extremity Assessment: RUE deficits/detail;Right hand dominant RUE Deficits / Details: Increasing tone to R arm. RUE Sensation: decreased proprioception RUE Coordination: decreased fine motor;decreased gross motor   Lower Extremity Assessment Lower Extremity Assessment: Defer to PT evaluation   Cervical / Trunk Assessment Cervical / Trunk Assessment: Normal    Vision   Additional Comments: Pre-morbid L visual field deficit post eye surgery.  ? double vision and L neglect   Perception     Praxis Praxis Praxis: Impaired Praxis Impairment Details: Perseveration;Motor planning;Initiation   Communication Communication Communication: Impaired Factors Affecting Communication: Difficulty expressing self;Reduced clarity of speech   Cognition Arousal: Alert Behavior During Therapy: WFL for tasks assessed/performed Cognition: Cognition impaired   Orientation impairments: Place, Time, Situation   Memory impairment (select all impairments): Working memory Attention impairment (select  first level of impairment): Focused attention Executive functioning impairment (select all impairments): Organization,  Sequencing, Reasoning, Problem solving, Initiation                   Following commands: Impaired Following commands impaired: Follows one step commands inconsistently      Cueing   Cueing Techniques: Verbal cues, Gestural cues, Tactile cues  Exercises      Shoulder Instructions       General Comments      Pertinent Vitals/ Pain       Pain Assessment Pain Assessment: No/denies pain                                                          Frequency  Min 2X/week        Progress Toward Goals  OT Goals(current goals can now be found in the care plan section)  Progress towards OT goals: Progressing toward goals  Acute Rehab OT Goals OT Goal Formulation: With patient Time For Goal Achievement: 02/16/24 Potential to Achieve Goals: Good  Plan      Co-evaluation                 AM-PAC OT 6 Clicks Daily Activity     Outcome Measure   Help from another person eating meals?: A Lot Help from another person taking care of personal grooming?: A Lot Help from another person toileting, which includes using toliet, bedpan, or urinal?: A Lot Help from another person bathing (including washing, rinsing, drying)?: A Lot Help from another person to put on and taking off regular upper body clothing?: A Lot Help from another person to put on and taking off regular lower body clothing?: A Lot 6 Click Score: 12    End of Session Equipment Utilized During Treatment: Gait belt  OT Visit Diagnosis: Unsteadiness on feet (R26.81);Cognitive communication deficit (R41.841) Symptoms and signs involving cognitive functions: Nontraumatic SAH   Activity Tolerance Patient tolerated treatment well   Patient Left in chair;with call bell/phone within reach;with chair alarm set;with family/visitor present   Nurse Communication Mobility status        Time: 8675-8653 OT Time Calculation (min): 22 min  Charges: OT General Charges $OT Visit: 1  Visit OT Treatments $Self Care/Home Management : 8-22 mins  02/03/2024  RP, OTR/L  Acute Rehabilitation Services  Office:  [phone]   Charlie JONETTA Halsted 02/03/2024, 1:52 PM

## 2024-02-03 NOTE — Evaluation (Signed)
 Physical Therapy Evaluation Patient Details Name: Edwin Dickerson MRN: [id number] DOB: 10/23/1950 Today's Date: 02/03/2024  History of Present Illness  73 year old male admitted 9/3 with progressive confusion and R weakness over the last couple days. fall OOB with head trauma x4 weeks ago. 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 S/p Left frontotemporoparietal craniotomy for evacuation of subdural hematoma on 9/3. PMH includes: TIA, HLD.  Clinical Impression  Pt presents with decreased strength and coordination, RUE>RLE. Pt following one-step commands inconsistently during today's session. Family reports that speech and word finding declined after JP drain was removed earlier. Pt to benefit from acute PT to address deficits. Pt ambulated a short household distance in the hallway with no overt LOB but would consistently drift to the R due to inattention. Pt would require minA to correct drift. Max cues were given to look and scan the environment on the R. Patient will benefit from intensive inpatient follow-up therapy, >3 hours/day to address deficit and return to baseline. Educated family to try and have pt attend more to his R side by sitting to his R and conversing with him. PT to progress mobility as tolerated, and will continue to follow acutely.      If plan is discharge home, recommend the following: A little help with walking and/or transfers;A lot of help with bathing/dressing/bathroom;Assistance with cooking/housework;Assist for transportation;Help with stairs or ramp for entrance   Can travel by private vehicle        Equipment Recommendations Other (comment) (TBD)  Recommendations for Other Services       Functional Status Assessment Patient has had a recent decline in their functional status and demonstrates the ability to make significant improvements in function in a reasonable and predictable amount of time.     Precautions / Restrictions  Precautions Precautions: Fall Recall of Precautions/Restrictions: Impaired Precaution/Restrictions Comments: Watch SBP Restrictions Weight Bearing Restrictions Per Provider Order: No      Mobility  Bed Mobility               General bed mobility comments: In recliner at start and end of session    Transfers Overall transfer level: Needs assistance   Transfers: Sit to/from Stand Sit to Stand: Contact guard assist           General transfer comment: Increased time for rise. CGA to steady when standing.    Ambulation/Gait Ambulation/Gait assistance: Min assist Gait Distance (Feet): 220 Feet   Gait Pattern/deviations: Decreased stride length, Step-through pattern, Drifts right/left       General Gait Details: Max cues for pt to look R. Assist to correct gait to walk straight as pt drifted R. No overt LOB during ambulation  Stairs            Wheelchair Mobility     Tilt Bed    Modified Rankin (Stroke Patients Only)       Balance                                             Pertinent Vitals/Pain Pain Assessment Pain Assessment: Faces Faces Pain Scale: No hurt    Home Living Family/patient expects to be discharged to:: Private residence Living Arrangements: Spouse/significant other Available Help at Discharge: Family;Available 24 hours/day Type of Home: House Home Access: Stairs to enter       Home Layout: Two level;Able to  live on main level with bedroom/bathroom Home Equipment: None Additional Comments: 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, 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 ADLs Comments: Ind with ADL and iADL.     Extremity/Trunk Assessment   Upper Extremity  Assessment Upper Extremity Assessment: Defer to OT evaluation RUE Deficits / Details: Increasing tone to R arm. RUE Sensation: decreased proprioception RUE Coordination: decreased fine motor;decreased gross motor    Lower Extremity Assessment Lower Extremity Assessment: Generalized weakness;RLE deficits/detail (At least 4/5 on LLE. Increased time and multimodal cues for MMTs) RLE Coordination: decreased gross motor    Cervical / Trunk Assessment Cervical / Trunk Assessment: Normal  Communication   Communication Communication: Impaired Factors Affecting Communication: Difficulty expressing self;Reduced clarity of speech    Cognition Arousal: Alert Behavior During Therapy: WFL for tasks assessed/performed                             Following commands: Impaired Following commands impaired: Follows one step commands inconsistently     Cueing Cueing Techniques: Verbal cues, Gestural cues, Tactile cues     General Comments General comments (skin integrity, edema, etc.): JP drain dc 9/5 this AM, dressing in place. VSS on RA    Exercises     Assessment/Plan    PT Assessment Patient needs continued PT services  PT Problem List Decreased strength;Decreased balance;Decreased safety awareness;Decreased coordination       PT Treatment Interventions Gait training;Stair training;Functional mobility training;Therapeutic activities;Therapeutic exercise;Neuromuscular re-education    PT Goals (Current goals can be found in the Care Plan section)  Acute Rehab PT Goals Patient Stated Goal: Return to independence PT Goal Formulation: With patient/family Time For Goal Achievement: 02/17/24 Potential to Achieve Goals: Good    Frequency Min 3X/week     Co-evaluation               AM-PAC PT 6 Clicks Mobility  Outcome Measure Help needed turning from your back to your side while in a flat bed without using bedrails?: A Little Help needed moving from lying on  your back to sitting on the side of a flat bed without using bedrails?: A Little Help needed moving to and from a bed to a chair (including a wheelchair)?: A Little Help needed standing up from a chair using your arms (e.g., wheelchair or bedside chair)?: A Little Help needed to walk in hospital room?: A Little Help needed climbing 3-5 steps with a railing? : A Lot 6 Click Score: 17    End of Session Equipment Utilized During Treatment: Gait belt Activity Tolerance: Patient tolerated treatment well Patient left: in chair;with call bell/phone within reach;with chair alarm set Nurse Communication: Mobility status PT Visit Diagnosis: Other abnormalities of gait and mobility (R26.89);Muscle weakness (generalized) (M62.81)    Time: 1410-1433 PT Time Calculation (min) (ACUTE ONLY): 23 min   Charges:   PT Evaluation $PT Eval Low Complexity: 1 Low PT Treatments $Gait Training: 8-22 mins PT General Charges $$ ACUTE PT VISIT: 1 Visit         Quintin Campi, SPT  Acute Rehab  [phone]   Quintin Campi 02/03/2024, 3:02 PM

## 2024-02-03 NOTE — TOC Initial Note (Signed)
 Transition of Care [facility]) - Initial/Assessment Note    Patient Details  Name: Edwin Dickerson MRN: [id number] Date of Birth: [birth year]  Transition of Care [facility] CM/SW Contact:    Inocente GORMAN Kindle, LCSW Phone Number: 02/03/2024, 5:06 PM  Clinical Narrative:                 Patient admitted from home with spouse. CSW following for CIR determination of candidacy.   Expected Discharge Plan: IP Rehab Facility Barriers to Discharge: Continued Medical Work up   Patient Goals and CMS Choice            Expected Discharge Plan and Services     Post Acute Care Choice: IP Rehab Living arrangements for the past 2 months: Single Family Home                                      Prior Living Arrangements/Services Living arrangements for the past 2 months: Single Family Home Lives with:: Spouse Patient language and need for interpreter reviewed:: Yes Do you feel safe going back to the place where you live?: Yes      Need for Family Participation in Patient Care: Yes (Comment) Care giver support system in place?: Yes (comment)   Criminal Activity/Legal Involvement Pertinent to Current Situation/Hospitalization: No - Comment as needed  Activities of Daily Living      Permission Sought/Granted Permission sought to share information with : Facility Medical sales representative, Family Supports Permission granted to share information with : No  Share Information with NAME: Ebrima, Ranta [phone]/  [phone]           Emotional Assessment Appearance:: Appears stated age Attitude/Demeanor/Rapport: Unable to Assess Affect (typically observed): Unable to Assess Orientation: : Oriented to Self Alcohol / Substance Use: Not Applicable Psych Involvement: No (comment)  Admission diagnosis:  S/P craniotomy [Z98.890] SDH (subdural hematoma) (HCC) [S06.5XAA] Patient Active Problem List   Diagnosis Date Noted   S/P craniotomy 02/01/2024   SDH (subdural hematoma) (HCC)  02/01/2024   Hypertension 02/01/2024   Hyperlipidemia 02/01/2024   Routine adult health maintenance 12/07/2016   Vision changes    Word finding difficulty    Elevated blood pressure reading    Benign prostatic hyperplasia with lower urinary tract symptoms    Chronic nonintractable headache    TIA (transient ischemic attack) 10/22/2016   Blurred vision, bilateral    Slurred speech    Transient cerebral ischemia    PCP:  Hope Merle, MD (Inactive) Pharmacy:   [facility] [address] [place] - [address] [place] [address] Phone: [phone] Fax: [phone]     Social Drivers of Health (SDOH) Social History: SDOH Screenings   Tobacco Use: Low Risk  (02/01/2024)   SDOH Interventions:     Readmission Risk Interventions     No data to display

## 2024-02-03 NOTE — Anesthesia Postprocedure Evaluation (Signed)
 Anesthesia Post Note  Patient: Edwin Dickerson  Procedure(s) Performed: CRANIOTOMY HEMATOMA EVACUATION SUBDURAL (Left: Head)     Patient location during evaluation: PACU Anesthesia Type: General Level of consciousness: awake and alert Pain management: pain level controlled Vital Signs Assessment: post-procedure vital signs reviewed and stable Respiratory status: spontaneous breathing, nonlabored ventilation, respiratory function stable and patient connected to nasal cannula oxygen Cardiovascular status: blood pressure returned to baseline and stable Postop Assessment: no apparent nausea or vomiting Anesthetic complications: no   No notable events documented.               Abdulla Pooley D Sharnika Binney

## 2024-02-03 NOTE — Progress Notes (Signed)
  Inpatient Rehabilitation Admissions Coordinator   Met with patient and wife at bedside for rehab assessment. We discussed goals and expectations of a possible CIR admit.  They discussed the need for CIR admit vs OP therapy, but they are open to CIR admit if needed prior to return home. Wife would like him to be as close to baseline as possible at discharge. He had surgery to his left eye 8 weeks ago, that had affected his vision prior to injury. Family can provide expected caregiver support that is recommended. Wife states he takes meds at night to assist him with his sleeping. Otherwise he awakens with night mares as he did 4 weeks ago when he fell. We will follow up Monday with his progress to determine appropriate rehab venue. Please call me with any questions.   Heron Leavell, RN, MSN Rehab Admissions Coordinator [phone]

## 2024-02-03 NOTE — PMR Pre-admission (Signed)
 PMR Admission Coordinator Pre-Admission Assessment  Patient: Edwin Dickerson is an 73 y.o., male MRN: [id number] DOB: Feb 07, 1951 Height: 5' 7 (170.2 cm) Weight: 90.7 kg  Insurance Information HMO:     PPO:      PCP:      IPA:      80/20:      OTHER:  PRIMARY: Medicare Part A and B      Policy#: [id number]      Subscriber: pt Benefits:  Phone #: passport one source online     Name: 9/5 Eff. Date: 08/29/2013     Deduct: $1676      Out of Pocket Max: none      Life Max: none CIR: 100%      SNF: 20 full days Outpatient: 80%     Co-Pay: 20% Home Health: 100%      Co-Pay: none DME: 80%     Co-Pay: 20% Providers: pt choice  SECONDARY: Tax adviser      Policy#: [id number]     Phone#:   Financial Counselor:       Phone#:   The "Data Collection Information Summary" for patients in Inpatient Rehabilitation Facilities with attached "Privacy Act Statement-Health Care Records" was provided and verbally reviewed with: Patient and Family  Emergency Contact Information Contact Information     Name Relation Home Work Mobile   Lynam,Susan Spouse [phone]  [phone]   Henry, Utsey   [phone]      Other Contacts   None on File    Current Medical History  Patient Admitting Diagnosis: SDH  History of Present Illness: Youssef Footman is a 73 year old right-handed male with history of hyperlipidemia, TIA, BPH,, class I obesity BMI 31.32, left detached retina and recent eye surgery per Dr. Rankin Burke.  Per chart review patient lives with spouse.  Presented 02/01/2024 with progressive altered mental status/word finding difficulties and right side weakness.  By report patient had fallen from his bed and struck his head approximately 4 weeks ago with persistent headaches.  Cranial CT scan showed mixed density subdural hematoma centered along the left frontal convexity (measuring up to 2.3 mm in thickness).  Mass effect upon the underlying brain parenchyma with 8 mm rightward  midline shift.  Mild cerebral atrophy.  Underwent left frontotemporoparietal craniotomy for evacuation of subdural hematoma 02/01/2024 per Dr. Alm Molt.  Postoperative cranial CT scan 02/02/2024 showed small new subarachnoid and subdural hemorrhage slight decrease in maximal thickness of subdural hematoma from 22 mm to 20 mm.  Mild interval improvement of shift of midline structures to the right now measuring 4 mm (previously 8 mm) and advised continued monitoring per neurosurgery.  Decadron  protocol as indicated.  Keppra  maintained for seizure prophylaxis.  A follow-up cranial CT scan repeated 02/03/2024 showing postoperative changes from evacuation of left SDH.  The SDH overall appeared a bit smaller compared with prior scan of 02/02/2024.  The 4 mm midline shift was unchanged.  No change in subarachnoid hemorrhage.  JP drain was removed 02/03/2024.  Tolerating a regular consistency diet.  Therapy evaluations completed due to patient decreased functional mobility was admitted for a comprehensive rehab program.   Complete NIHSS TOTAL: 5  Patient's medical record from [facility] has been reviewed by the rehabilitation admission coordinator and physician.  Past Medical History  Past Medical History:  Diagnosis Date   High cholesterol    TIA (transient ischemic attack)    Has the patient had major surgery during 100 days prior to admission? Yes  Family History   family history is not on file.  Current Medications  Current Facility-Administered Medications:    acetaminophen  (TYLENOL ) tablet 650 mg, 650 mg, Oral, Q4H PRN, 650 mg at 02/02/24 2112 **OR** acetaminophen  (TYLENOL ) suppository 650 mg, 650 mg, Rectal, Q4H PRN, Meyran, Suzen Lacks, NP   atorvastatin  (LIPITOR) tablet 40 mg, 40 mg, Oral, Daily, Meyran, Suzen Lacks, NP, 40 mg at 02/03/24 1037   bisacodyl  (DULCOLAX) EC tablet 5 mg, 5 mg, Oral, Daily PRN, Meyran, Suzen Lacks, NP   Chlorhexidine  Gluconate Cloth 2 % PADS 6 each, 6  each, Topical, Daily, Joshua Alm Hamilton, MD, 6 each at 02/02/24 2000   [COMPLETED] dexamethasone  (DECADRON ) tablet 6 mg, 6 mg, Oral, Q6H, 6 mg at 02/02/24 1712 **FOLLOWED BY** dexamethasone  (DECADRON ) tablet 4 mg, 4 mg, Oral, Q6H, 4 mg at 02/03/24 1224 **FOLLOWED BY** [START ON 02/04/2024] dexamethasone  (DECADRON ) tablet 4 mg, 4 mg, Oral, Q8H, Meyran, Suzen Lacks, NP   docusate sodium  (COLACE) capsule 100 mg, 100 mg, Oral, BID, Meyran, Suzen Lacks, NP, 100 mg at 02/03/24 1037   hydrALAZINE  (APRESOLINE ) injection 10-20 mg, 10-20 mg, Intravenous, Q4H PRN, Meyran, Suzen Lacks, NP   HYDROcodone -acetaminophen  (NORCO/VICODIN) 5-325 MG per tablet 1 tablet, 1 tablet, Oral, Q4H PRN, Meyran, Suzen Lacks, NP, 1 tablet at 02/01/24 2321   HYDROmorphone  (DILAUDID ) injection 0.5-1 mg, 0.5-1 mg, Intravenous, Q2H PRN, Meyran, Suzen Lacks, NP, 1 mg at 02/02/24 0354   labetalol  (NORMODYNE ) injection 10-40 mg, 10-40 mg, Intravenous, Q10 min PRN, Meyran, Suzen Lacks, NP, 20 mg at 02/02/24 0032   levETIRAcetam  (KEPPRA ) tablet 500 mg, 500 mg, Oral, BID, Joshua Alm Hamilton, MD, 500 mg at 02/03/24 1037   ondansetron  (ZOFRAN ) tablet 4 mg, 4 mg, Oral, Q4H PRN **OR** ondansetron  (ZOFRAN ) injection 4 mg, 4 mg, Intravenous, Q4H PRN, Meyran, Suzen Lacks, NP   Oral care mouth rinse, 15 mL, Mouth Rinse, PRN, Joshua Alm Hamilton, MD   pantoprazole  (PROTONIX ) EC tablet 40 mg, 40 mg, Oral, Daily, Joshua Alm Hamilton, MD, 40 mg at 02/02/24 2111   senna (SENOKOT) tablet 8.6 mg, 1 tablet, Oral, BID, Meyran, Suzen Lacks, NP, 8.6 mg at 02/03/24 1037  Patients Current Diet:  Diet Order             Diet regular Room service appropriate? Yes; Fluid consistency: Thin  Diet effective now                  Precautions / Restrictions Precautions Precautions: Fall Precaution/Restrictions Comments: Watch SBP Restrictions Weight Bearing Restrictions Per Provider Order: No   Has the patient had 2 or more  falls or a fall with injury in the past year? 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 Independent  Indoor Mobility: Did the patient need assistance with walking from room to room (with or without device)? Independent  Stairs: Did the patient need assistance with internal or external stairs (with or without device)? Independent  Functional Cognition: Did the patient need help planning regular tasks such as shopping or remembering to take medications? Independent  Patient Information Are you of Hispanic, Latino/a,or Spanish origin?: A. No, not of Hispanic, Latino/a, or Spanish origin What is your race?: A. White Do you need or want an interpreter to communicate with a doctor or health care staff?: 0. No  Patient's Response To:  Health Literacy  and Transportation Is the patient able to respond to health literacy and transportation needs?: Yes Health Literacy - How often do you need to have someone help you when you read instructions, pamphlets, or other written material from your doctor or pharmacy?: Sometimes In the past 12 months, has lack of transportation kept you from medical appointments or from getting medications?: No In the past 12 months, has lack of transportation kept you from meetings, work, or from getting things needed for daily living?: No  Journalist, newspaper / Equipment Home Equipment: None  Prior Device Use: Indicate devices/aids used by the patient prior to current illness, exacerbation or injury? None of the above  Current Functional Level Cognition  Arousal/Alertness: Awake/alert Overall Cognitive Status: Impaired/Different from baseline Orientation Level: Oriented to person, Disoriented to place, Disoriented to time, Disoriented to situation Attention: Sustained Sustained  Attention: Appears intact Memory:  (will assess further in tx) Awareness: Impaired Awareness Impairment: Emergent impairment (decreased awareness of errors) Problem Solving:  (will assess further in diagnostic tx) Safety/Judgment: Impaired    Extremity Assessment (includes Sensation/Coordination)  Upper Extremity Assessment: Defer to OT evaluation RUE Deficits / Details: Increasing tone to R arm. RUE Sensation: decreased proprioception RUE Coordination: decreased fine motor, decreased gross motor  Lower Extremity Assessment: Generalized weakness, RLE deficits/detail (At least 4/5 on LLE. Increased time and multimodal cues for MMTs) RLE Coordination: decreased gross motor    ADLs  Overall ADL's : Needs assistance/impaired Eating/Feeding: Moderate assistance, Sitting Grooming: Moderate assistance, Standing Upper Body Bathing: Moderate assistance, Sitting Lower Body Bathing: Moderate assistance, Sit to/from stand Upper Body Dressing : Moderate assistance, Sitting Lower Body Dressing: Maximal assistance, Sit to/from stand Toilet Transfer: Minimal assistance, Stand-pivot, BSC/3in1    Mobility  Overal bed mobility: Needs Assistance Bed Mobility: Supine to Sit Supine to sit: Min assist General bed mobility comments: In recliner at start and end of session    Transfers  Overall transfer level: Needs assistance Transfers: Sit to/from Stand Sit to Stand: Contact guard assist Bed to/from chair/wheelchair/BSC transfer type:: Step pivot Step pivot transfers: Min assist General transfer comment: Increased time for rise. CGA to steady when standing.    Ambulation / Gait / Stairs / Wheelchair Mobility  Ambulation/Gait Ambulation/Gait assistance: Editor, commissioning (Feet): 220 Feet Gait Pattern/deviations: Decreased stride length, Step-through pattern, Drifts right/left General Gait Details: Max cues for pt to look R. Assist to correct gait to walk straight as pt drifted R. No overt  LOB during ambulation    Posture / Balance Balance Overall balance assessment: Needs assistance Sitting-balance support: Feet supported Sitting balance-Leahy Scale: Fair Standing balance support: Single extremity supported Standing balance-Leahy Scale: Poor    Special considerations/life events     Previous Home Environment  Living Arrangements: Spouse/significant other  Lives With: Spouse Available Help at Discharge: Family, Available 24 hours/day Type of Home: House Home Layout: Two level, Able to live on main level with bedroom/bathroom Home Access: Stairs to enter Bathroom Shower/Tub: Engineer, manufacturing systems: Standard Bathroom Accessibility: Yes How Accessible: Accessible via walker Home Care Services: No Additional Comments: 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, 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  Discharge Living Setting Plans for Discharge Living Setting: Patient's home, House, Lives with (comment) (wife) Type of Home at Discharge: House Discharge Home Layout: Two level, Able to live on main level with bedroom/bathroom Discharge Home Access: Stairs to enter Discharge Bathroom Shower/Tub: Tub/shower unit Discharge Bathroom Toilet: Standard Discharge Bathroom Accessibility: Yes  How Accessible: Accessible via walker Does the patient have any problems obtaining your medications?: No  Social/Family/Support Systems Patient Roles: Spouse, Parent (farmer) Contact Information: wife, [name] Anticipated Caregiver: wife Anticipated Industrial/product designer Information: see contacts Ability/Limitations of Caregiver: spouse no limitations Caregiver Availability: 24/7 Discharge Plan Discussed with Primary Caregiver: Yes Is Caregiver In Agreement with Plan?: Yes Does Caregiver/Family have Issues with Lodging/Transportation while Pt is in Rehab?: No  Goals Patient/Family Goal for Rehab: supervision with PT, OT and SLP Expected length of stay: ELOS 10 to 12  days Pt/Family Agrees to Admission and willing to participate: Yes Program Orientation Provided & Reviewed with Pt/Caregiver Including Roles  & Responsibilities: Yes  Decrease burden of Care through IP rehab admission: n/a  Possible need for SNF placement upon discharge: not anticipated  Patient Condition: I have reviewed medical records from [facility], spoken with patient and spouse. I met with patient at the bedside for inpatient rehabilitation assessment.  Patient will benefit from ongoing PT, OT, and SLP, can actively participate in 3 hours of therapy a day 5 days of the week, and can make measurable gains during the admission.  Patient will also benefit from the coordinated team approach during an Inpatient Acute Rehabilitation admission.  The patient will receive intensive therapy as well as Rehabilitation physician, nursing, social worker, and care management interventions.  Due to bladder management, bowel management, safety, skin/wound care, disease management, medication administration, pain management, and patient education the patient requires 24 hour a day rehabilitation nursing.  The patient is currently min A with mobility and basic ADLs.  Discharge setting and therapy post discharge at home with outpatient is anticipated.  Patient has agreed to participate in the Acute Inpatient Rehabilitation Program and will admit today.  Preadmission Screen Completed By:  Alison Heron Lot RN MSN 02/03/2024 5:37 PM, updates provided by Kristyn Conetta, PT on 02/06/24 ______________________________________________________________________   Discussed status with Dr. Carilyn on 02/06/24 at 9:00am and received approval for admission today.  Admission Coordinator:  Alison Heron Lot, RN MSN, updates provided by Cosmo Platter, PT time 10:36am/Date 02/06/24   Assessment/Plan: Diagnosis: Left frontal SDH Does the need for close, 24 hr/day Medical supervision in concert with the  patient's rehab needs make it unreasonable for this patient to be served in a less intensive setting? Yes Co-Morbidities requiring supervision/potential complications: BPH, HTN, class 1 obesity  Due to bladder management, bowel management, safety, skin/wound care, disease management, medication administration, pain management, and patient education, does the patient require 24 hr/day rehab nursing? Yes Does the patient require coordinated care of a physician, rehab nurse, PT, OT, and SLP to address physical and functional deficits in the context of the above medical diagnosis(es)? Yes Addressing deficits in the following areas: balance, endurance, locomotion, strength, transferring, bowel/bladder control, bathing, dressing, feeding, grooming, toileting, and cognition Can the patient actively participate in an intensive therapy program of at least 3 hrs of therapy 5 days a week? Yes The potential for patient to make measurable gains while on inpatient rehab is good Anticipated functional outcomes upon discharge from inpatient rehab: supervision PT, supervision OT, supervision SLP Estimated rehab length of stay to reach the above functional goals is: 10-12d Anticipated discharge destination: Home 10. Overall Rehab/Functional Prognosis: good   MD Signature: Prentice CHARLENA Carilyn M.D. [facility] Fellow Am Acad of Phys Med and Rehab Diplomate Am Board of Electrodiagnostic Med Fellow Am Board of Interventional Pain

## 2024-02-03 NOTE — Evaluation (Signed)
 Speech Language Pathology Evaluation Patient Details Name: Edwin Dickerson MRN: [id number] DOB: 31-Oct-1950 Today's Date: 02/03/2024 Time: 8564-8543 SLP Time Calculation (min) (ACUTE ONLY): 21 min  Problem List:  Patient Active Problem List   Diagnosis Date Noted   S/P craniotomy 02/01/2024   SDH (subdural hematoma) (HCC) 02/01/2024   Hypertension 02/01/2024   Hyperlipidemia 02/01/2024   Routine adult health maintenance 12/07/2016   Vision changes    Word finding difficulty    Elevated blood pressure reading    Benign prostatic hyperplasia with lower urinary tract symptoms    Chronic nonintractable headache    TIA (transient ischemic attack) 10/22/2016   Blurred vision, bilateral    Slurred speech    Transient cerebral ischemia    Past Medical History:  Past Medical History:  Diagnosis Date   High cholesterol    TIA (transient ischemic attack)    Past Surgical History:  Past Surgical History:  Procedure Laterality Date   CRANIOTOMY Left 02/01/2024   Procedure: CRANIOTOMY HEMATOMA EVACUATION SUBDURAL;  Surgeon: Joshua Alm Hamilton, MD;  Location: [facility] OR;  Service: Neurosurgery;  Laterality: Left;   PARS PLANA REPAIR OF RETINAL DEATACHMENT     HPI:  73 year old male admitted 9/3 with progressive confusion and R weakness over the last couple days. fall OOB with head trauma x4 weeks ago. 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 S/p Left frontotemporoparietal craniotomy for evacuation of subdural hematoma on 9/3. PMH includes: TIA, HLD.   Assessment / Plan / Recommendation Clinical Impression  Family reports that pt's language was normal this morning without difficulties however after JP drain removed pt having difficulty with expression and comprehension. 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 He was given various parts of the bedside Western Aphasia Battery and  demonstrates signficant impairments in expressive and receptive language. Expressive language characterized as mostly fluent in sentences including hesitations, false starts, paraphasias and occassional neologisms with inconsistent awareness of errors. At times he could accurately state intended message in phrases. 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 Increased difficulty noted with more abstract yes/no questions and he could not follow any sequential commands using objects. He named common objects with 8/8 accuracy. Pt and family educated on assessment and given strategies to facilitate comprehension and expression. Continue ST in acute care and recommend inpatient rehab >3 hours.    SLP Assessment  SLP Recommendation/Assessment: Patient needs continued Speech Language Pathology Services SLP Visit Diagnosis: Aphasia (R47.01)     Assistance Recommended at Discharge  Frequent or constant Supervision/Assistance  Functional Status Assessment Patient has had a recent decline in their functional status and demonstrates the ability to make significant improvements in function in a reasonable and predictable amount of time.  Frequency and Duration min 2x/week  2 weeks      SLP Evaluation Cognition  Overall Cognitive Status: Impaired/Different from baseline Arousal/Alertness: Awake/alert Orientation Level: Oriented to person (focused on aphasia- will assess orientation further in tx) Attention: Sustained Sustained Attention: Appears intact Memory:  (will assess further in tx) Awareness: Impaired Awareness Impairment: Emergent impairment (decreased awareness of errors) Problem Solving:  (will assess further in diagnostic tx) Safety/Judgment: Impaired       Comprehension  Auditory Comprehension Overall Auditory Comprehension: Impaired Yes/No Questions: Impaired Other Yes/No Questions Comments`: Maximal (mildly abstract 50%) Commands:  Impaired Multistep Basic Commands: 0-24% accurate (sequential  commands on bedside WAB 0%) Visual Recognition/Discrimination Discrimination: Not tested Reading Comprehension Reading Status:  (pt does not read at baseline)    Expression Expression Primary Mode of Expression: Verbal Verbal Expression Overall Verbal Expression: Impaired Initiation: No impairment Automatic Speech: Name Level of Generative/Spontaneous Verbalization: Phrase;Sentence Repetition: Impaired Level of Impairment: Sentence level Naming: No impairment (named common objects with 8/8 accuracy) Pragmatics: No impairment Written Expression Dominant Hand: Right Written Expression:  (Does not write at baseline)   Oral / Motor  Oral Motor/Sensory Function Overall Oral Motor/Sensory Function: Mild impairment Facial ROM: Other (Comment) (motor apraxia- will continue to assess) Lingual ROM: Other (Comment) (motor apraxia) Lingual Symmetry: Within Functional Limits Motor Speech Overall Motor Speech: Appears within functional limits for tasks assessed Respiration: Within functional limits Phonation: Normal Resonance: Within functional limits Articulation: Within functional limitis Intelligibility: Intelligible Motor Planning:  (appeared functional- will continue to assess)            Edwin Dickerson 02/03/2024, 4:22 PM

## 2024-02-03 NOTE — Progress Notes (Signed)
 Subjective: Patient reports doing really well, no headaches  Objective: Vital signs in last 24 hours: Temp:  [98.1 F (36.7 C)-99.4 F (37.4 C)] 98.3 F (36.8 C) (09/05 0800) Pulse Rate:  [61-76] 64 (09/05 0600) Resp:  [13-23] 13 (09/05 0600) BP: (113-149)/(53-83) 128/76 (09/05 0600) SpO2:  [90 %-93 %] 92 % (09/05 0600)  Intake/Output from previous day: 09/04 0701 - 09/05 0700 In: 600 [P.O.:600] Out: 2620 [Urine:2400; Drains:220] Intake/Output this shift: No intake/output data recorded.  Neurologic: Grossly normal  Lab Results: Lab Results  Component Value Date   WBC 15.4 (H) 02/03/2024   HGB 13.4 02/03/2024   HCT 38.3 (L) 02/03/2024   MCV 88.9 02/03/2024   PLT 187 02/03/2024   Lab Results  Component Value Date   INR 1.0 02/01/2024   BMET Lab Results  Component Value Date   NA 139 02/03/2024   K 4.0 02/03/2024   CL 106 02/03/2024   CO2 21 (L) 02/03/2024   GLUCOSE 184 (H) 02/03/2024   BUN 16 02/03/2024   CREATININE 1.09 02/03/2024   CALCIUM  9.2 02/03/2024    Studies/Results: CT HEAD WO CONTRAST Result Date: 02/02/2024 EXAM: CT HEAD WITHOUT CONTRAST 02/02/2024 06:21:21 AM TECHNIQUE: CT of the head was performed without the administration of intravenous contrast. Automated exposure control, iterative reconstruction, and/or weight based adjustment of the mA/kV was utilized to reduce the radiation dose to as low as reasonably achievable. COMPARISON: CT of the head dated 02/01/2024. CLINICAL HISTORY: Subdural hematoma. FINDINGS: BRAIN AND VENTRICLES: Since the previous study, the patient has undergone left frontoparietal craniotomy for evacuation of a left subdural hematoma. A drainage catheter has been placed in the interim. There is new subarachnoid and subdural hemorrhage present. Overall, the subdural hematoma is slightly decreased in maximal thickness on the coronal images from 22 mm to 20 mm. There has been mild interval improvement of shift of midline structures to  the right, now measuring 4 mm. Previously the midline shift measured up to 8 mm. ORBITS: No acute abnormality. SINUSES: No acute abnormality. SOFT TISSUES AND SKULL: No acute soft tissue abnormality. Postoperative changes of left frontoparietal craniotomy. IMPRESSION: 1. Status post left frontoparietal craniotomy for evacuation of a left subdural hematoma with placement of a drainage catheter. 2. New subarachnoid and subdural hemorrhage. 3. Slight decrease in maximal thickness of the subdural hematoma from 22 mm to 20 mm. 4. Mild interval improvement of shift of midline structures to the right, now measuring 4 mm (previously 8 mm). Electronically signed by: Evalene Coho MD 02/02/2024 06:50 AM EDT RP Workstation: [id number]   CT HEAD WO CONTRAST Addendum Date: 02/01/2024 ADDENDUM REPORT: 02/01/2024 17:18 ADDENDUM: Impression #1 called by telephone on 02/01/2024 at 5:17 pm to provider Dr. Neysa, who verbally acknowledged these results. Electronically Signed   By: Rockey Childs D.O.   On: 02/01/2024 17:18   Result Date: 02/01/2024 CLINICAL DATA:  Provided history: Neuro deficit, acute, stroke suspected. Right-sided weakness. Slurred speech. Right-sided facial droop. EXAM: CT HEAD WITHOUT CONTRAST TECHNIQUE: Contiguous axial images were obtained from the base of the skull through the vertex without intravenous contrast. RADIATION DOSE REDUCTION: This exam was performed according to the departmental dose-optimization program which includes automated exposure control, adjustment of the mA and/or kV according to patient size and/or use of iterative reconstruction technique. COMPARISON:  Brain MRI 10/23/2016. FINDINGS: Brain: Mild generalized cerebral atrophy. Mixed density subdural hematoma centered along the left frontal convexity (measuring up to 2.3 cm in thickness). The subdural hematoma contains low-density, intermediate density and  hyperdense components. Mass effect upon the underlying brain parenchyma with 8 mm  rightward midline shift. The basal cisterns remain patent. No demarcated cortical infarct. No evidence of an intracranial mass. Vascular: No hyperdense vessel.  Atherosclerotic calcifications. Skull: No calvarial fracture or aggressive osseous lesion. Sinuses/Orbits: No mass or acute finding within the imaged orbits. No significant paranasal sinus disease at the imaged levels. Attempts are being made to reach the ordering provider at this time. IMPRESSION: 1. Mixed density subdural hematoma centered along the left frontal convexity (measuring up to 2.3 mm in thickness). Mass effect upon the underlying brain parenchyma with 8 mm rightward midline shift. 2. Mild cerebral atrophy. Electronically Signed: By: Rockey Childs D.O. On: 02/01/2024 17:13    Assessment/Plan: Postop day 2 crani for SDH. He is doing a lot better today. Speech is clear and making a lot more sense. Strength is 5/5. We did d/c the JP drain today. Ok to transfer to the floor    LOS: 2 days    Suzen Lacks Dionel Archey 02/03/2024, 11:08 AM

## 2024-02-03 NOTE — Consult Note (Signed)
 NAME:  Edwin Dickerson, MRN:  [id number], DOB:  1950-10-03, LOS: 2 ADMISSION DATE:  02/01/2024, CONSULTATION DATE:  9/3 REFERRING MD:  Dr. Joshua, CHIEF COMPLAINT:  SDH   History of Present Illness:  Patient is encephalopathic and/or intubated; therefore, history has been obtained from chart review.  73 year old male with past medical history as below, which is significant for TIA and hyperlipidemia.  Reportedly fell out of bed earlier this summer and did strike his head on the nightstand.  Afterwards had a small headache but no real symptoms.  Around the end of August he developed some forgetfulness and word finding difficulty as well as progressive weakness in the right upper and lower extremities.  He presented to the [facility] emergency department 9/3 with these complaints.  CT of the head demonstrated subdural hematoma with midline shift.  Neurosurgery was consulted and took the patient urgently to the operating room for craniotomy and evacuation.  Postoperatively the patient was sent to the neuro ICU for recovery with JP drain in place.  PCCM asked to assist with medical management.   Pertinent  Medical History   has a past medical history of High cholesterol and TIA (transient ischemic attack).   Significant Hospital Events: Including procedures, antibiotic start and stop dates in addition to other pertinent events   9/3 admitted to ICU.  Interim History / Subjective:  Much alert and oriented today.  Alert and oriented x 3.  Improved strength on the right.  Able to recollect recent events.  Markedly improved compared to yesterday.  Objective    Blood pressure 128/76, pulse 64, temperature 98.3 F (36.8 C), temperature source Oral, resp. rate 13, height 5' 7 (1.702 m), weight 90.7 kg, SpO2 92%.        Intake/Output Summary (Last 24 hours) at 02/03/2024 1056 Last data filed at 02/03/2024 9487 Gross per 24 hour  Intake 480 ml  Output 2600 ml  Net -2120 ml   Filed Weights   02/01/24 1530   Weight: 90.7 kg    Examination: General: Elderly male of normal body habitus in NAD HENT: JP drain in place. Lungs: Clear to auscultation bilaterally. Cardiovascular: RRR, no MRG Abdomen: Soft, NT, ND Neuro: A and O x 3.  Moving all 4 extremities.  Strength 5/5.  Much improved compared to yesterday.  Resolved problem list   Assessment and Plan   Subdural hematoma on the left: s/p crani and evacuation 9/3 CT head with left SDH and midline shift.  Repeat CT head 9/4 with decrease in SDH from 22 to 20 mm and improvement in midline shift from 8 to 4 mm.  However there is new subarachnoid and SDH. -Neurosurgery primary. - Keep SBP 130-116mmHg - PRN hydralazine  - Neurochecks per unit protocol - Keppra  for seizure ppx - multimodal pain control - Decadron  taper per neurosurgery - PT/OT consulted.  HLD - Lipitor.  Will follow along while the patient is in the ICU.  Labs   CBC: Recent Labs  Lab 02/01/24 1552 02/01/24 1558 02/03/24 0902  WBC 8.9  --  15.4*  NEUTROABS 7.4  --   --   HGB 14.6 13.6 13.4  HCT 41.5 40.0 38.3*  MCV 87.2  --  88.9  PLT 215  --  187    Basic Metabolic Panel: Recent Labs  Lab 02/01/24 1552 02/01/24 1558 02/03/24 0902  NA 141 139 139  K 3.9 3.9 4.0  CL 106 106 106  CO2 20*  --  21*  GLUCOSE 100* 102*  184*  BUN 15 16 16   CREATININE 1.11 1.10 1.09  CALCIUM  9.1  --  9.2  MG  --   --  2.0  PHOS  --   --  3.3   GFR: Estimated Creatinine Clearance: 62.9 mL/min (by C-G formula based on SCr of 1.09 mg/dL). Recent Labs  Lab 02/01/24 1552 02/03/24 0902  WBC 8.9 15.4*    Liver Function Tests: Recent Labs  Lab 02/01/24 1552  AST 21  ALT 22  ALKPHOS 71  BILITOT 2.3*  PROT 6.7  ALBUMIN 4.2   No results for input(s): LIPASE, AMYLASE in the last 168 hours. No results for input(s): AMMONIA in the last 168 hours.  ABG    Component Value Date/Time   TCO2 21 (L) 02/01/2024 1558     Coagulation Profile: Recent Labs  Lab  02/01/24 1552  INR 1.0    Cardiac Enzymes: No results for input(s): CKTOTAL, CKMB, CKMBINDEX, TROPONINI in the last 168 hours.  HbA1C: Hgb A1c MFr Bld  Date/Time Value Ref Range Status  10/23/2016 02:21 AM 5.1 4.8 - 5.6 % Final    Comment:    (NOTE)         Pre-diabetes: 5.7 - 6.4         Diabetes: >6.4         Glycemic control for adults with diabetes: <7.0     CBG: No results for input(s): GLUCAP in the last 168 hours.  Past Medical History:  He,  has a past medical history of High cholesterol and TIA (transient ischemic attack).   Surgical History:   Past Surgical History:  Procedure Laterality Date   CRANIOTOMY Left 02/01/2024   Procedure: CRANIOTOMY HEMATOMA EVACUATION SUBDURAL;  Surgeon: Joshua Alm Hamilton, MD;  Location: [facility] OR;  Service: Neurosurgery;  Laterality: Left;   PARS PLANA REPAIR OF RETINAL DEATACHMENT       Social History:   reports that he has never smoked. He has never used smokeless tobacco. He reports that he does not drink alcohol and does not use drugs.   Family History:  His family history is not on file.   Allergies Allergies  Allergen Reactions   Penicillins Hives, Rash and Other (See Comments)    blacked out     Home Medications  Prior to Admission medications   Medication Sig Start Date End Date Taking? Authorizing Provider  atorvastatin  (LIPITOR) 40 MG tablet Take 1 tablet (40 mg total) by mouth daily at 6 PM. 11/01/16   Kathrin Mignon DASEN, MD    CRITICAL CARE Performed by: Sammi JONETTA Fredericks.     Total critical care time: NA minutes   Critical care time was exclusive of separately billable procedures and treating other patients.   Critical care was necessary to treat or prevent imminent or life-threatening deterioration.   Critical care was time spent personally by me on the following activities: development of treatment plan with patient and/or surrogate as well as nursing, discussions with consultants, evaluation of  patient's response to treatment, examination of patient, obtaining history from patient or surrogate, ordering and performing treatments and interventions, ordering and review of laboratory studies, ordering and review of radiographic studies, pulse oximetry, re-evaluation of patient's condition and participation in multidisciplinary rounds.  Sammi JONETTA Fredericks, MD Pulmonary, Critical Care and Sleep Attending.  Pager: [phone]  02/03/2024, 10:56 AM

## 2024-02-04 DIAGNOSIS — Z9889 Other specified postprocedural states: Secondary | ICD-10-CM | POA: Diagnosis not present

## 2024-02-04 LAB — GLUCOSE, CAPILLARY
Glucose-Capillary: 154 mg/dL — ABNORMAL HIGH (ref 70–99)
Glucose-Capillary: 173 mg/dL — ABNORMAL HIGH (ref 70–99)
Glucose-Capillary: 123 mg/dL — ABNORMAL HIGH (ref 70–99)

## 2024-02-04 MED ORDER — CLONAZEPAM 0.5 MG PO TABS
0.5000 mg | ORAL_TABLET | Freq: Every day | ORAL | Status: DC
  Administered 2024-02-04 – 2024-02-05 (×2): 0.5 mg via ORAL
  Filled 2024-02-04 (×2): qty 1

## 2024-02-04 MED ORDER — LORATADINE 10 MG PO TABS
10.0000 mg | ORAL_TABLET | Freq: Every day | ORAL | Status: DC
  Administered 2024-02-04 – 2024-02-06 (×3): 10 mg via ORAL
  Filled 2024-02-04 (×3): qty 1

## 2024-02-04 MED ORDER — LOSARTAN POTASSIUM 50 MG PO TABS
50.0000 mg | ORAL_TABLET | Freq: Every day | ORAL | Status: DC
  Administered 2024-02-04 – 2024-02-06 (×3): 50 mg via ORAL
  Filled 2024-02-04 (×3): qty 1

## 2024-02-04 MED ORDER — INSULIN ASPART 100 UNIT/ML IJ SOLN
0.0000 [IU] | Freq: Three times a day (TID) | INTRAMUSCULAR | Status: DC
  Administered 2024-02-04: 3 [IU] via SUBCUTANEOUS
  Administered 2024-02-04: 2 [IU] via SUBCUTANEOUS
  Administered 2024-02-05: 1 [IU] via SUBCUTANEOUS
  Administered 2024-02-05: 2 [IU] via SUBCUTANEOUS
  Administered 2024-02-05 – 2024-02-06 (×2): 1 [IU] via SUBCUTANEOUS
  Administered 2024-02-06: 2 [IU] via SUBCUTANEOUS

## 2024-02-04 NOTE — Progress Notes (Signed)
 Patient ID: Edwin Dickerson, male   DOB: 04-25-1951, 74 y.o.   MRN: [id number] Patient's vital signs are stable he is awake and alert demonstrating no evidence of a drift and motor function being normal.  He is feeling quite reasonably well.  He is ready for transfer to the floor.

## 2024-02-04 NOTE — Consult Note (Signed)
 NAME:  Edwin Dickerson, MRN:  [id number], DOB:  01-May-1951, LOS: 3 ADMISSION DATE:  02/01/2024, CONSULTATION DATE:  9/3 REFERRING MD:  Dr. Joshua, CHIEF COMPLAINT:  SDH   History of Present Illness:  Patient is encephalopathic and/or intubated; therefore, history has been obtained from chart review.  73 year old male with past medical history as below, which is significant for TIA and hyperlipidemia.  Reportedly fell out of bed earlier this summer and did strike his head on the nightstand.  Afterwards had a small headache but no real symptoms.  Around the end of August he developed some forgetfulness and word finding difficulty as well as progressive weakness in the right upper and lower extremities.  He presented to the [facility] emergency department 9/3 with these complaints.  CT of the head demonstrated subdural hematoma with midline shift.  Neurosurgery was consulted and took the patient urgently to the operating room for craniotomy and evacuation.  Postoperatively the patient was sent to the neuro ICU for recovery with JP drain in place.  PCCM asked to assist with medical management.   Pertinent  Medical History   has a past medical history of High cholesterol and TIA (transient ischemic attack).   Significant Hospital Events: Including procedures, antibiotic start and stop dates in addition to other pertinent events   9/3 admitted to ICU. 9/5: JP drain out  Interim History / Subjective:  Alert and oriented x 3.  Improved strength on the right.  Able to recollect recent events.    Objective    Blood pressure (!) 161/91, pulse 65, temperature 98.2 F (36.8 C), temperature source Oral, resp. rate 19, height 5' 7 (1.702 m), weight 90.7 kg, SpO2 91%.        Intake/Output Summary (Last 24 hours) at 02/04/2024 1056 Last data filed at 02/04/2024 0900 Gross per 24 hour  Intake 400 ml  Output 2375 ml  Net -1975 ml   Filed Weights   02/01/24 1530  Weight: 90.7 kg    Examination: General:  Elderly male of normal body habitus in NAD HENT: JP drain out. Area covered with gauze Lungs: Clear to auscultation bilaterally. Cardiovascular: RRR, no MRG Abdomen: Soft, NT, ND Neuro: A and O x 3.  Moving all 4 extremities.  Strength 5/5.   Resolved problem list   Assessment and Plan   Subdural hematoma on the left: s/p crani and evacuation 9/3 CT head with left SDH and midline shift.  Repeat CT head 9/4 with decrease in SDH from 22 to 20 mm and improvement in midline shift from 8 to 4 mm.  However there is new subarachnoid and SDH. -Neurosurgery primary. - Keep SBP 130-154mmHg - PRN hydralazine  - Neurochecks per unit protocol - Keppra  for seizure ppx - multimodal pain control - Decadron  taper per neurosurgery - PT/OT consulted.  Elevated BP: - added losartan  50mg .   HLD - Lipitor.  Hyperglycemia: - Secondary to Decadron .  SSI.  Okay to transfer out of ICU from critical care standpoint.  Labs   CBC: Recent Labs  Lab 02/01/24 1552 02/01/24 1558 02/03/24 0902  WBC 8.9  --  15.4*  NEUTROABS 7.4  --   --   HGB 14.6 13.6 13.4  HCT 41.5 40.0 38.3*  MCV 87.2  --  88.9  PLT 215  --  187    Basic Metabolic Panel: Recent Labs  Lab 02/01/24 1552 02/01/24 1558 02/03/24 0902  NA 141 139 139  K 3.9 3.9 4.0  CL 106 106 106  CO2  20*  --  21*  GLUCOSE 100* 102* 184*  BUN 15 16 16   CREATININE 1.11 1.10 1.09  CALCIUM  9.1  --  9.2  MG  --   --  2.0  PHOS  --   --  3.3   GFR: Estimated Creatinine Clearance: 62.9 mL/min (by C-G formula based on SCr of 1.09 mg/dL). Recent Labs  Lab 02/01/24 1552 02/03/24 0902  WBC 8.9 15.4*    Liver Function Tests: Recent Labs  Lab 02/01/24 1552  AST 21  ALT 22  ALKPHOS 71  BILITOT 2.3*  PROT 6.7  ALBUMIN 4.2   No results for input(s): LIPASE, AMYLASE in the last 168 hours. No results for input(s): AMMONIA in the last 168 hours.  ABG    Component Value Date/Time   TCO2 21 (L) 02/01/2024 1558      Coagulation Profile: Recent Labs  Lab 02/01/24 1552  INR 1.0    Cardiac Enzymes: No results for input(s): CKTOTAL, CKMB, CKMBINDEX, TROPONINI in the last 168 hours.  HbA1C: Hgb A1c MFr Bld  Date/Time Value Ref Range Status  10/23/2016 02:21 AM 5.1 4.8 - 5.6 % Final    Comment:    (NOTE)         Pre-diabetes: 5.7 - 6.4         Diabetes: >6.4         Glycemic control for adults with diabetes: <7.0     CBG: No results for input(s): GLUCAP in the last 168 hours.  Past Medical History:  He,  has a past medical history of High cholesterol and TIA (transient ischemic attack).   Surgical History:   Past Surgical History:  Procedure Laterality Date   CRANIOTOMY Left 02/01/2024   Procedure: CRANIOTOMY HEMATOMA EVACUATION SUBDURAL;  Surgeon: Joshua Alm Hamilton, MD;  Location: [facility] OR;  Service: Neurosurgery;  Laterality: Left;   PARS PLANA REPAIR OF RETINAL DEATACHMENT       Social History:   reports that he has never smoked. He has never used smokeless tobacco. He reports that he does not drink alcohol and does not use drugs.   Family History:  His family history is not on file.   Allergies Allergies  Allergen Reactions   Penicillins Hives, Rash and Other (See Comments)    blacked out     Home Medications  Prior to Admission medications   Medication Sig Start Date End Date Taking? Authorizing Provider  atorvastatin  (LIPITOR) 40 MG tablet Take 1 tablet (40 mg total) by mouth daily at 6 PM. 11/01/16   Kathrin Mignon DASEN, MD    CRITICAL CARE Performed by: Sammi JONETTA Fredericks.     Total critical care time: NA minutes   Critical care time was exclusive of separately billable procedures and treating other patients.   Critical care was necessary to treat or prevent imminent or life-threatening deterioration.   Critical care was time spent personally by me on the following activities: development of treatment plan with patient and/or surrogate as well as nursing,  discussions with consultants, evaluation of patient's response to treatment, examination of patient, obtaining history from patient or surrogate, ordering and performing treatments and interventions, ordering and review of laboratory studies, ordering and review of radiographic studies, pulse oximetry, re-evaluation of patient's condition and participation in multidisciplinary rounds.  Sammi JONETTA Fredericks, MD Pulmonary, Critical Care and Sleep Attending.  Pager: [phone]  02/04/2024, 10:56 AM

## 2024-02-04 NOTE — Plan of Care (Signed)
  Problem: Education: Goal: Knowledge of General Education information will improve Description: Including pain rating scale, medication(s)/side effects and non-pharmacologic comfort measures Outcome: Progressing   Problem: Health Behavior/Discharge Planning: Goal: Ability to manage health-related needs will improve Outcome: Progressing   Problem: Clinical Measurements: Goal: Ability to maintain clinical measurements within normal limits will improve Outcome: Progressing Goal: Will remain free from infection Outcome: Progressing Goal: Diagnostic test results will improve Outcome: Progressing Goal: Respiratory complications will improve Outcome: Progressing Goal: Cardiovascular complication will be avoided Outcome: Progressing   Problem: Activity: Goal: Risk for activity intolerance will decrease Outcome: Progressing   Problem: Nutrition: Goal: Adequate nutrition will be maintained Outcome: Progressing   Problem: Coping: Goal: Level of anxiety will decrease Outcome: Progressing   Problem: Elimination: Goal: Will not experience complications related to bowel motility Outcome: Progressing Goal: Will not experience complications related to urinary retention Outcome: Progressing   Problem: Pain Managment: Goal: General experience of comfort will improve and/or be controlled Outcome: Progressing   Problem: Safety: Goal: Ability to remain free from injury will improve Outcome: Progressing   Problem: Skin Integrity: Goal: Risk for impaired skin integrity will decrease Outcome: Progressing   Problem: Education: Goal: Knowledge of the prescribed therapeutic regimen will improve Outcome: Progressing   Problem: Clinical Measurements: Goal: Usual level of consciousness will be regained or maintained. Outcome: Progressing Goal: Neurologic status will improve Outcome: Progressing Goal: Ability to maintain intracranial pressure will improve Outcome: Progressing    Problem: Skin Integrity: Goal: Demonstration of wound healing without infection will improve Outcome: Progressing

## 2024-02-05 DIAGNOSIS — Z9889 Other specified postprocedural states: Secondary | ICD-10-CM

## 2024-02-05 LAB — CBC
HCT: 39.3 % (ref 39.0–52.0)
Hemoglobin: 14 g/dL (ref 13.0–17.0)
MCH: 31.3 pg (ref 26.0–34.0)
MCHC: 35.6 g/dL (ref 30.0–36.0)
MCV: 87.7 fL (ref 80.0–100.0)
Platelets: 206 K/uL (ref 150–400)
RBC: 4.48 MIL/uL (ref 4.22–5.81)
RDW: 12.5 % (ref 11.5–15.5)
WBC: 12.5 K/uL — ABNORMAL HIGH (ref 4.0–10.5)
nRBC: 0 % (ref 0.0–0.2)

## 2024-02-05 LAB — BASIC METABOLIC PANEL WITH GFR
Anion gap: 12 (ref 5–15)
BUN: 24 mg/dL — ABNORMAL HIGH (ref 8–23)
CO2: 22 mmol/L (ref 22–32)
Calcium: 8.9 mg/dL (ref 8.9–10.3)
Chloride: 102 mmol/L (ref 98–111)
Creatinine, Ser: 0.98 mg/dL (ref 0.61–1.24)
GFR, Estimated: >60 mL/min (ref >60)
Glucose, Bld: 123 mg/dL — ABNORMAL HIGH (ref 70–99)
Potassium: 4.1 mmol/L (ref 3.5–5.1)
Sodium: 136 mmol/L (ref 135–145)

## 2024-02-05 LAB — GLUCOSE, CAPILLARY
Glucose-Capillary: 126 mg/dL — ABNORMAL HIGH (ref 70–99)
Glucose-Capillary: 168 mg/dL — ABNORMAL HIGH (ref 70–99)
Glucose-Capillary: 128 mg/dL — ABNORMAL HIGH (ref 70–99)
Glucose-Capillary: 142 mg/dL — ABNORMAL HIGH (ref 70–99)

## 2024-02-05 NOTE — Progress Notes (Signed)
 Patient ID: Edwin Dickerson, male   DOB: 1950/08/27, 73 y.o.   MRN: [id number] Vital signs are stable.  His motor function appears good and he is mobilizing well.  Likely ready for discharge soon.

## 2024-02-05 NOTE — Hospital Course (Addendum)
 Edwin Dickerson is a 73 y.o. year old male with PMH of  TIA and hyperlipidemia who fell out of bed earlier this summer and did strike his head on the nightstand afterwards had a small headache but no real symptoms around the end of August he developed some forgetfulness and word finding difficulty as well as progressive weakness in the right upper and lower extremities and see in in ED 9/3CT of the head demonstrated subdural hematoma with midline shift. Neurosurgery was consulted and s/p craniotomy and evacuation Patient was managed postoperatively in ICU with JP drain in place> subsequently removed on 9/5 Patient has been stabilized and transferred out of ICU to TRH service 9/7  Subjective: Seen and examined today Resting comfortably on the edge of the bed, wife at the bedside No new complaints Overnight afebrile blood pressure 120s-150s, on room air Labs reviewed from 9/5 stable with leukocytosis No labs since 9/5 will recheck routine labs this morning  Assessment and plan:  Subdural hematoma left S/P craniotomy: Patient with midline shift from recent fall with traumatic SDH underwent craniotomy 9/3, JP drain out 9/5, patient has been extubated and transferred to MedSurg.  Continue plan of care as per primary neurosurgery team. Currently on Klonopin  bedtime, Keppra  twice daily, Decadron  4 mg every 8 hours.  Continue fall precaution seizure precaution PT OT  Hypertension: BP remains stable, on losartan .  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  Mobility: PT Orders: Active  PT Follow up Rec: Acute Inpatient Rehab (3hours/Day)02/03/2024 1449   DVT prophylaxis: SCDs Start: 02/01/24 2156 Code Status:   Code Status: Full Code Family Communication: plan of care discussed with patient at bedside. Patient status is: Remains hospitalized because  of severity of illness Level of care: Med-Surg   Dispo: The patient is from: home            Anticipated disposition: TBD.  TRH will continue to follow for medical management Objective: Vitals last 24 hrs: Vitals:   02/04/24 2333 02/05/24 0251 02/05/24 0809 02/05/24 0855  BP: (!) 168/73 (!) 154/82 116/69 119/66  Pulse: 62 60 81   Resp: 16 16 16    Temp: 97.8 F (36.6 C) 97.6 F (36.4 C) 98.2 F (36.8 C) (!) 97.5 F (36.4 C)  TempSrc: Oral Oral Oral Oral  SpO2: 93% 94% 94%   Weight:      Height:        Physical Examination: General exam: alert awake, oriented, older than stated age HEENT:Oral mucosa moist, Ear/Nose WNL grossly Respiratory system: Bilaterally clear BS,no use of accessory muscle Cardiovascular system: S1 & S2 +, No JVD. Gastrointestinal system: Abdomen soft,NT,ND, BS+ Nervous System: Alert, awake, moving all extremities,and following commands. Extremities: LE edema neg, distal extremities warm.  Skin: No rashes,no icterus. MSK: Normal muscle bulk,tone, power .  Craniotomy site with Aquacel dressing in place  Medications reviewed:  Scheduled Meds:  atorvastatin   40 mg Oral Daily   Chlorhexidine  Gluconate Cloth  6 each Topical Daily   clonazePAM   0.5 mg Oral QHS   dexamethasone   4 mg Oral Q8H   docusate sodium   100 mg Oral BID   insulin  aspart  0-9 Units Subcutaneous TID WC   levETIRAcetam   500 mg Oral BID   loratadine   10 mg Oral Daily   losartan   50 mg Oral Daily   pantoprazole   40 mg Oral Daily  senna  1 tablet Oral BID   Continuous Infusions: Diet: Diet Order             Diet regular Room service appropriate? Yes; Fluid consistency: Thin  Diet effective now

## 2024-02-05 NOTE — Progress Notes (Signed)
 PROGRESS NOTE Edwin Dickerson  FMW:[id number] DOB: 1951/02/01 DOA: 02/01/2024 PCP: Hope Merle, MD (Inactive)  Brief Narrative/Hospital Course: Edwin Dickerson is a 73 y.o. year old male with PMH of  TIA and hyperlipidemia who fell out of bed earlier this summer and did strike his head on the nightstand afterwards had a small headache but no real symptoms around the end of August he developed some forgetfulness and word finding difficulty as well as progressive weakness in the right upper and lower extremities and see in in ED 9/3CT of the head demonstrated subdural hematoma with midline shift. Neurosurgery was consulted and s/p craniotomy and evacuation Patient was managed postoperatively in ICU with JP drain in place> subsequently removed on 9/5 Patient has been stabilized and transferred out of ICU to TRH service 9/7  Subjective: Seen and examined today Resting comfortably on the edge of the bed, wife at the bedside No new complaints Overnight afebrile blood pressure 120s-150s, on room air Labs reviewed from 9/5 stable with leukocytosis No labs since 9/5 will recheck routine labs this morning  Assessment and plan:  Subdural hematoma left S/P craniotomy: Patient with midline shift from recent fall with traumatic SDH underwent craniotomy 9/3, JP drain out 9/5, patient has been extubated and transferred to MedSurg.  Continue plan of care as per primary neurosurgery team. Currently on Klonopin  bedtime, Keppra  twice daily, Decadron  4 mg every 8 hours.  Continue fall precaution seizure precaution PT OT  Hypertension: BP remains stable, on losartan .  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  Mobility: PT Orders: Active  PT Follow up Rec: Acute Inpatient Rehab (3hours/Day)02/03/2024 1449   DVT prophylaxis: SCDs Start: 02/01/24 2156 Code Status:    Code Status: Full Code Family Communication: plan of care discussed with patient at bedside. Patient status is: Remains hospitalized because of severity of illness Level of care: Med-Surg   Dispo: The patient is from: home            Anticipated disposition: TBD.  TRH will continue to follow for medical management Objective: Vitals last 24 hrs: Vitals:   02/04/24 2333 02/05/24 0251 02/05/24 0809 02/05/24 0855  BP: (!) 168/73 (!) 154/82 116/69 119/66  Pulse: 62 60 81   Resp: 16 16 16    Temp: 97.8 F (36.6 C) 97.6 F (36.4 C) 98.2 F (36.8 C) (!) 97.5 F (36.4 C)  TempSrc: Oral Oral Oral Oral  SpO2: 93% 94% 94%   Weight:      Height:        Physical Examination: General exam: alert awake, oriented, older than stated age HEENT:Oral mucosa moist, Ear/Nose WNL grossly Respiratory system: Bilaterally clear BS,no use of accessory muscle Cardiovascular system: S1 & S2 +, No JVD. Gastrointestinal system: Abdomen soft,NT,ND, BS+ Nervous System: Alert, awake, moving all extremities,and following commands. Extremities: LE edema neg, distal extremities warm.  Skin: No rashes,no icterus. MSK: Normal muscle bulk,tone, power .  Craniotomy site with Aquacel dressing in place  Medications reviewed:  Scheduled Meds:  atorvastatin   40 mg Oral Daily   Chlorhexidine  Gluconate Cloth  6 each Topical Daily   clonazePAM   0.5 mg Oral QHS   dexamethasone   4 mg Oral Q8H   docusate sodium   100 mg Oral BID   insulin  aspart  0-9 Units Subcutaneous TID WC   levETIRAcetam   500 mg Oral BID   loratadine   10  mg Oral Daily   losartan   50 mg Oral Daily   pantoprazole   40 mg Oral Daily   senna  1 tablet Oral BID   Continuous Infusions: Diet: Diet Order             Diet regular Room service appropriate? Yes; Fluid consistency: Thin  Diet effective now                    Data Reviewed: I have personally reviewed following labs and imaging studies ( see epic result tab) CBC: Recent Labs  Lab  02/01/24 1552 02/01/24 1558 02/03/24 0902  WBC 8.9  --  15.4*  NEUTROABS 7.4  --   --   HGB 14.6 13.6 13.4  HCT 41.5 40.0 38.3*  MCV 87.2  --  88.9  PLT 215  --  187   CMP: Recent Labs  Lab 02/01/24 1552 02/01/24 1558 02/03/24 0902  NA 141 139 139  K 3.9 3.9 4.0  CL 106 106 106  CO2 20*  --  21*  GLUCOSE 100* 102* 184*  BUN 15 16 16   CREATININE 1.11 1.10 1.09  CALCIUM  9.1  --  9.2  MG  --   --  2.0  PHOS  --   --  3.3   GFR: Estimated Creatinine Clearance: 62.9 mL/min (by C-G formula based on SCr of 1.09 mg/dL). Recent Labs  Lab 02/01/24 1552  AST 21  ALT 22  ALKPHOS 71  BILITOT 2.3*  PROT 6.7  ALBUMIN 4.2   No results for input(s): LIPASE, AMYLASE in the last 168 hours. No results for input(s): AMMONIA in the last 168 hours. Coagulation Profile:  Recent Labs  Lab 02/01/24 1552  INR 1.0   Unresulted Labs (From admission, onward)     Start     Ordered   02/05/24 0938  Basic metabolic panel with GFR  Once,   R       Question:  Specimen collection method  Answer:  Lab=Lab collect   02/05/24 0937   02/05/24 0938  CBC  Once,   R       Question:  Specimen collection method  Answer:  Lab=Lab collect   02/05/24 9062           Antimicrobials/Microbiology: Anti-infectives (From admission, onward)    None      No results found for: SDES, SPECREQUEST, CULT, REPTSTATUS  Procedures: Procedure(s) (LRB): CRANIOTOMY HEMATOMA EVACUATION SUBDURAL (Left)   Mennie LAMY, MD [facility] 02/05/2024, 9:38 AM

## 2024-02-05 NOTE — Plan of Care (Signed)
  Problem: Activity: Goal: Risk for activity intolerance will decrease Outcome: Progressing   Problem: Nutrition: Goal: Adequate nutrition will be maintained Outcome: Progressing   Problem: Coping: Goal: Level of anxiety will decrease Outcome: Progressing   Problem: Elimination: Goal: Will not experience complications related to bowel motility Outcome: Progressing   Problem: Pain Managment: Goal: General experience of comfort will improve and/or be controlled Outcome: Progressing

## 2024-02-06 ENCOUNTER — Encounter (HOSPITAL_COMMUNITY): Payer: Self-pay | Admitting: Physical Medicine and Rehabilitation

## 2024-02-06 ENCOUNTER — Inpatient Hospital Stay (HOSPITAL_COMMUNITY)
Admission: AD | Admit: 2024-02-06 | Discharge: 2024-02-14 | DRG: 945 | Disposition: A | Discharge status: Home / Self Care | Source: Intra-hospital | Attending: Physical Medicine and Rehabilitation | Admitting: Physical Medicine and Rehabilitation

## 2024-02-06 DIAGNOSIS — Z555 Less than a high school diploma: Secondary | ICD-10-CM | POA: Diagnosis not present

## 2024-02-06 DIAGNOSIS — S065XAA Traumatic subdural hemorrhage with loss of consciousness status unknown, initial encounter: Secondary | ICD-10-CM | POA: Diagnosis not present

## 2024-02-06 DIAGNOSIS — R350 Frequency of micturition: Secondary | ICD-10-CM | POA: Diagnosis present

## 2024-02-06 DIAGNOSIS — N400 Enlarged prostate without lower urinary tract symptoms: Secondary | ICD-10-CM | POA: Diagnosis present

## 2024-02-06 DIAGNOSIS — I951 Orthostatic hypotension: Secondary | ICD-10-CM | POA: Diagnosis not present

## 2024-02-06 DIAGNOSIS — H3322 Serous retinal detachment, left eye: Secondary | ICD-10-CM | POA: Diagnosis present

## 2024-02-06 DIAGNOSIS — E871 Hypo-osmolality and hyponatremia: Secondary | ICD-10-CM | POA: Diagnosis present

## 2024-02-06 DIAGNOSIS — N179 Acute kidney failure, unspecified: Secondary | ICD-10-CM | POA: Diagnosis not present

## 2024-02-06 DIAGNOSIS — Z880 Allergy status to penicillin: Secondary | ICD-10-CM | POA: Diagnosis not present

## 2024-02-06 DIAGNOSIS — W06XXXD Fall from bed, subsequent encounter: Secondary | ICD-10-CM | POA: Diagnosis present

## 2024-02-06 DIAGNOSIS — Z9889 Other specified postprocedural states: Secondary | ICD-10-CM | POA: Diagnosis not present

## 2024-02-06 DIAGNOSIS — Z6831 Body mass index (BMI) 31.0-31.9, adult: Secondary | ICD-10-CM

## 2024-02-06 DIAGNOSIS — Z8673 Personal history of transient ischemic attack (TIA), and cerebral infarction without residual deficits: Secondary | ICD-10-CM | POA: Diagnosis not present

## 2024-02-06 DIAGNOSIS — E7800 Pure hypercholesterolemia, unspecified: Secondary | ICD-10-CM | POA: Diagnosis present

## 2024-02-06 DIAGNOSIS — I10 Essential (primary) hypertension: Secondary | ICD-10-CM | POA: Diagnosis present

## 2024-02-06 DIAGNOSIS — E66811 Obesity, class 1: Secondary | ICD-10-CM | POA: Diagnosis present

## 2024-02-06 DIAGNOSIS — D72829 Elevated white blood cell count, unspecified: Secondary | ICD-10-CM | POA: Diagnosis present

## 2024-02-06 DIAGNOSIS — K5900 Constipation, unspecified: Secondary | ICD-10-CM | POA: Diagnosis present

## 2024-02-06 DIAGNOSIS — S065XAD Traumatic subdural hemorrhage with loss of consciousness status unknown, subsequent encounter: Secondary | ICD-10-CM | POA: Diagnosis present

## 2024-02-06 DIAGNOSIS — R739 Hyperglycemia, unspecified: Secondary | ICD-10-CM | POA: Diagnosis not present

## 2024-02-06 DIAGNOSIS — N401 Enlarged prostate with lower urinary tract symptoms: Secondary | ICD-10-CM | POA: Diagnosis present

## 2024-02-06 DIAGNOSIS — R001 Bradycardia, unspecified: Secondary | ICD-10-CM | POA: Diagnosis present

## 2024-02-06 DIAGNOSIS — G459 Transient cerebral ischemic attack, unspecified: Secondary | ICD-10-CM

## 2024-02-06 DIAGNOSIS — Z79899 Other long term (current) drug therapy: Secondary | ICD-10-CM

## 2024-02-06 DIAGNOSIS — S065X9S Traumatic subdural hemorrhage with loss of consciousness of unspecified duration, sequela: Secondary | ICD-10-CM | POA: Diagnosis not present

## 2024-02-06 DIAGNOSIS — T380X5A Adverse effect of glucocorticoids and synthetic analogues, initial encounter: Secondary | ICD-10-CM | POA: Diagnosis not present

## 2024-02-06 DIAGNOSIS — S065X0A Traumatic subdural hemorrhage without loss of consciousness, initial encounter: Principal | ICD-10-CM

## 2024-02-06 LAB — GLUCOSE, CAPILLARY
Glucose-Capillary: 169 mg/dL — ABNORMAL HIGH (ref 70–99)
Glucose-Capillary: 126 mg/dL — ABNORMAL HIGH (ref 70–99)
Glucose-Capillary: 149 mg/dL — ABNORMAL HIGH (ref 70–99)
Glucose-Capillary: 156 mg/dL — ABNORMAL HIGH (ref 70–99)

## 2024-02-06 MED ORDER — DOCUSATE SODIUM 100 MG PO CAPS
100.0000 mg | ORAL_CAPSULE | Freq: Two times a day (BID) | ORAL | Status: DC
  Administered 2024-02-06 – 2024-02-10 (×8): 100 mg via ORAL
  Filled 2024-02-06 (×7): qty 1

## 2024-02-06 MED ORDER — ONDANSETRON HCL 4 MG PO TABS: 4.0000 mg | ORAL_TABLET | ORAL | Status: DC | PRN

## 2024-02-06 MED ORDER — ACETAMINOPHEN 650 MG RE SUPP: 650.0000 mg | RECTAL | Status: DC | PRN

## 2024-02-06 MED ORDER — ACETAMINOPHEN 325 MG PO TABS: 650.0000 mg | ORAL_TABLET | ORAL | Status: DC | PRN

## 2024-02-06 MED ORDER — DEXAMETHASONE 4 MG PO TABS
4.0000 mg | ORAL_TABLET | Freq: Three times a day (TID) | ORAL | Status: DC
  Administered 2024-02-06 – 2024-02-14 (×23): 4 mg via ORAL
  Filled 2024-02-06 (×23): qty 1

## 2024-02-06 MED ORDER — CLONAZEPAM 0.5 MG PO TABS
0.5000 mg | ORAL_TABLET | Freq: Every day | ORAL | Status: DC
  Administered 2024-02-06 – 2024-02-13 (×8): 0.5 mg via ORAL
  Filled 2024-02-06 (×9): qty 1

## 2024-02-06 MED ORDER — ATORVASTATIN CALCIUM 40 MG PO TABS
40.0000 mg | ORAL_TABLET | Freq: Every day | ORAL | Status: DC
Start: 2024-02-07 — End: 2024-02-14
  Administered 2024-02-07 – 2024-02-14 (×8): 40 mg via ORAL
  Filled 2024-02-06 (×8): qty 1

## 2024-02-06 MED ORDER — BISACODYL 5 MG PO TBEC: 5.0000 mg | DELAYED_RELEASE_TABLET | Freq: Every day | ORAL | Status: DC | PRN

## 2024-02-06 MED ORDER — LORATADINE 10 MG PO TABS
10.0000 mg | ORAL_TABLET | Freq: Every day | ORAL | Status: DC
  Administered 2024-02-07 – 2024-02-14 (×8): 10 mg via ORAL
  Filled 2024-02-06 (×8): qty 1

## 2024-02-06 MED ORDER — PANTOPRAZOLE SODIUM 40 MG PO TBEC
40.0000 mg | DELAYED_RELEASE_TABLET | Freq: Every day | ORAL | Status: DC
  Administered 2024-02-06 – 2024-02-13 (×8): 40 mg via ORAL
  Filled 2024-02-06 (×8): qty 1

## 2024-02-06 MED ORDER — LOSARTAN POTASSIUM 50 MG PO TABS
50.0000 mg | ORAL_TABLET | Freq: Every day | ORAL | Status: DC
  Administered 2024-02-07 – 2024-02-13 (×7): 50 mg via ORAL
  Filled 2024-02-06 (×7): qty 1

## 2024-02-06 MED ORDER — HYDROCODONE-ACETAMINOPHEN 5-325 MG PO TABS: 1.0000 | ORAL_TABLET | ORAL | Status: DC | PRN

## 2024-02-06 MED ORDER — SENNA 8.6 MG PO TABS
1.0000 | ORAL_TABLET | Freq: Two times a day (BID) | ORAL | Status: DC
  Administered 2024-02-06 – 2024-02-08 (×4): 8.6 mg via ORAL
  Filled 2024-02-06 (×4): qty 1

## 2024-02-06 MED ORDER — FINASTERIDE 5 MG PO TABS
5.0000 mg | ORAL_TABLET | Freq: Every day | ORAL | Status: DC
  Administered 2024-02-07 – 2024-02-14 (×8): 5 mg via ORAL
  Filled 2024-02-06 (×8): qty 1

## 2024-02-06 MED ORDER — LEVETIRACETAM 500 MG PO TABS
500.0000 mg | ORAL_TABLET | Freq: Two times a day (BID) | ORAL | Status: DC
  Administered 2024-02-06 – 2024-02-09 (×6): 500 mg via ORAL
  Filled 2024-02-06 (×6): qty 1

## 2024-02-06 MED ORDER — ONDANSETRON HCL 4 MG/2ML IJ SOLN: 4.0000 mg | INTRAMUSCULAR | Status: DC | PRN

## 2024-02-06 NOTE — H&P (Shared)
 Physical Medicine and Rehabilitation Admission H&P    Chief Complaint  Patient presents with   Weakness    Right sided weakness   Aphasia  : HPI: Edwin Dickerson is a 73 year old right-handed male with history of hyperlipidemia, TIA, BPH,, class I obesity BMI 31.32, left detached retina and recent eye surgery per Dr. Rankin Burke.  Per chart review patient lives with spouse.  Two-level home bed and bath main level.  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  He completed eighth grade however family reports he has difficulty reading and writing at baseline.  Presented 02/01/2024 with progressive altered mental status/word finding difficulties and right side weakness.  By report patient had fallen from his bed and struck his head approximately 4 weeks ago with persistent headaches.  Cranial CT scan showed mixed density subdural hematoma centered along the left frontal convexity (measuring up to 2.3 mm in thickness).  Mass effect upon the underlying brain parenchyma with 8 mm rightward midline shift.  Mild cerebral atrophy.  Underwent left frontotemporoparietal craniotomy for evacuation of subdural hematoma 02/01/2024 per Dr. Alm Molt.  Postoperative cranial CT scan 02/02/2024 showed small new subarachnoid and subdural hemorrhage slight decrease in maximal thickness of subdural hematoma from 22 mm to 20 mm.  Mild interval improvement of shift of midline structures to the right now measuring 4 mm (previously 8 mm) and advised continued monitoring per neurosurgery.  Decadron  protocol as indicated.  Keppra  maintained for seizure prophylaxis.  A follow-up cranial CT scan repeated 02/03/2024 showing postoperative changes from evacuation of left SDH.  The SDH overall appeared a bit smaller compared with prior scan of 02/02/2024.  The 4 mm midline shift was unchanged.  No change in subarachnoid hemorrhage.  JP drain was removed 02/03/2024.  Tolerating a regular consistency diet.  Therapy evaluations completed due to patient  decreased functional mobility was admitted for a comprehensive rehab program.  Review of Systems  Constitutional:  Negative for chills and fever.  HENT:  Negative for hearing loss.   Eyes:  Positive for blurred vision. Negative for double vision.  Respiratory:  Negative for cough, shortness of breath and wheezing.   Cardiovascular:  Negative for chest pain, palpitations and leg swelling.  Gastrointestinal:  Positive for constipation. Negative for heartburn, nausea and vomiting.  Genitourinary:  Positive for urgency. Negative for dysuria, flank pain and hematuria.  Musculoskeletal:  Positive for myalgias.       Recent fall approximately 4 weeks ago from the bed striking his head on the nightstand  Skin:  Negative for rash.  Neurological:  Positive for speech change and weakness.  Psychiatric/Behavioral:  The patient has insomnia.   All other systems reviewed and are negative.  Past Medical History:  Diagnosis Date   High cholesterol    TIA (transient ischemic attack)    Past Surgical History:  Procedure Laterality Date   CRANIOTOMY Left 02/01/2024   Procedure: CRANIOTOMY HEMATOMA EVACUATION SUBDURAL;  Surgeon: Molt Alm Hamilton, MD;  Location: [facility];  Service: Neurosurgery;  Laterality: Left;   PARS PLANA REPAIR OF RETINAL DEATACHMENT     History reviewed. No pertinent family history. Social History:  reports that he has never smoked. He has never used smokeless tobacco. He reports that he does not drink alcohol and does not use drugs. Allergies:  Allergies  Allergen Reactions   Penicillins Hives, Rash and Other (See Comments)    blacked out   Medications Prior to Admission  Medication Sig Dispense Refill   atorvastatin  (LIPITOR)  40 MG tablet Take 1 tablet (40 mg total) by mouth daily at 6 PM. 90 tablet 3   cetirizine (ZYRTEC) 10 MG tablet TAKE 1 TABLET BY MOUTH ONCE DAILY AS NEEDED FOR ALLERGIES     clonazePAM  (KLONOPIN ) 0.5 MG tablet Take 0.5 mg by mouth at bedtime.      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 ADLs Comments: Ind with ADL and iADL.  Functional Status:  Mobility: Bed Mobility Overal bed mobility: Needs Assistance Bed Mobility: Supine to Sit Supine to sit: Min assist General bed mobility comments: In recliner at start and end of session Transfers Overall transfer level: Needs assistance Transfers: Sit to/from Stand Sit to Stand: Contact guard assist Bed to/from chair/wheelchair/BSC transfer type:: Step pivot Step pivot transfers: Min assist General transfer comment: Increased time for rise. CGA to steady when standing. Ambulation/Gait Ambulation/Gait assistance: Min assist Gait Distance (Feet): 220 Feet Gait Pattern/deviations: Decreased stride length, Step-through pattern, Drifts right/left General Gait Details: Max cues for pt to look R. Assist to correct gait to walk straight as pt drifted R. No overt LOB during ambulation    ADL: ADL Overall ADL's : Needs assistance/impaired Eating/Feeding: Moderate assistance, Sitting Grooming: Moderate assistance, Standing Upper Body Bathing:  Moderate assistance, Sitting Lower Body Bathing: Moderate assistance, Sit to/from stand Upper Body Dressing : Moderate assistance, Sitting Lower Body Dressing: Maximal assistance, Sit to/from stand Toilet Transfer: Minimal assistance, Stand-pivot, BSC/3in1  Cognition: Cognition Overall Cognitive Status: Impaired/Different from baseline Arousal/Alertness: Awake/alert Orientation Level: Oriented X4 Attention: Sustained Sustained Attention: Appears intact Memory:  (will assess further in tx) Awareness: Impaired Awareness Impairment: Emergent impairment (decreased awareness of errors) Problem Solving:  (will assess further in diagnostic tx) Safety/Judgment: Impaired Cognition Arousal: Alert Behavior During Therapy: WFL for tasks assessed/performed Overall Cognitive Status: Impaired/Different from baseline  Physical Exam: Blood pressure (!) 142/82, pulse (!) 53, temperature 98.1 F (36.7 C), temperature source Oral, resp. rate 18, height 5' 7 (1.702 m), weight 90.7 kg, SpO2 96%. Physical Exam HENT:     Head:     Comments: Craniotomy site clean and dry with Aquacel dressing Neurological:     Comments: Patient is alert sitting up in bed.  Makes eye contact with examiner.  Follows simple commands.  Speech is fluent.  Provides name and age/date of birth.     Results for orders placed or performed during the hospital encounter of 02/01/24 (from the past 48 hours)  Glucose, capillary     Status: Abnormal   Collection Time: 02/04/24 12:11 PM  Result Value Ref Range   Glucose-Capillary 154 (H) 70 - 99 mg/dL    Comment: Glucose reference range applies only to samples taken after fasting for at least 8 hours.  Glucose,  capillary     Status: Abnormal   Collection Time: 02/04/24  5:39 PM  Result Value Ref Range   Glucose-Capillary 173 (H) 70 - 99 mg/dL    Comment: Glucose reference range applies only to samples taken after fasting for at least 8 hours.  Glucose, capillary     Status:  Abnormal   Collection Time: 02/04/24  9:10 PM  Result Value Ref Range   Glucose-Capillary 123 (H) 70 - 99 mg/dL    Comment: Glucose reference range applies only to samples taken after fasting for at least 8 hours.  Glucose, capillary     Status: Abnormal   Collection Time: 02/05/24  8:50 AM  Result Value Ref Range   Glucose-Capillary 126 (H) 70 - 99 mg/dL    Comment: Glucose reference range applies only to samples taken after fasting for at least 8 hours.  Basic metabolic panel with GFR     Status: Abnormal   Collection Time: 02/05/24 10:21 AM  Result Value Ref Range   Sodium 136 135 - 145 mmol/L   Potassium 4.1 3.5 - 5.1 mmol/L   Chloride 102 98 - 111 mmol/L   CO2 22 22 - 32 mmol/L   Glucose, Bld 123 (H) 70 - 99 mg/dL    Comment: Glucose reference range applies only to samples taken after fasting for at least 8 hours.   BUN 24 (H) 8 - 23 mg/dL   Creatinine, Ser 9.01 0.61 - 1.24 mg/dL   Calcium  8.9 8.9 - 10.3 mg/dL   GFR, Estimated >39 >39 mL/min    Comment: (NOTE) Calculated using the CKD-EPI Creatinine Equation (2021)    Anion gap 12 5 - 15    Comment: Performed at [facility], [address] [place], [place] [address]  CBC     Status: Abnormal   Collection Time: 02/05/24 10:21 AM  Result Value Ref Range   WBC 12.5 (H) 4.0 - 10.5 K/uL   RBC 4.48 4.22 - 5.81 MIL/uL   Hemoglobin 14.0 13.0 - 17.0 g/dL   HCT 60.6 60.9 - 47.9 %   MCV 87.7 80.0 - 100.0 fL   MCH 31.3 26.0 - 34.0 pg   MCHC 35.6 30.0 - 36.0 g/dL   RDW 87.4 88.4 - 84.4 %   Platelets 206 150 - 400 K/uL   nRBC 0.0 0.0 - 0.2 %    Comment: Performed at [facility], [address] [place], [place] [address]  Glucose, capillary     Status: Abnormal   Collection Time: 02/05/24 12:35 PM  Result Value Ref Range   Glucose-Capillary 168 (H) 70 - 99 mg/dL    Comment: Glucose reference range applies only to samples taken after fasting for at least 8 hours.  Glucose, capillary     Status: Abnormal    Collection Time: 02/05/24  4:35 PM  Result Value Ref Range   Glucose-Capillary 128 (H) 70 - 99 mg/dL    Comment: Glucose reference range applies only to samples taken after fasting for at least 8 hours.  Glucose, capillary     Status: Abnormal   Collection Time: 02/05/24  9:16 PM  Result Value Ref Range   Glucose-Capillary 142 (H) 70 - 99 mg/dL    Comment: Glucose reference range applies only to samples taken after fasting for at least 8 hours.   No results found.    Blood pressure (!) 142/82, pulse (!) 53, temperature 98.1 F (36.7 C), temperature source Oral, resp. rate 18, height  5' 7 (1.702 m), weight 90.7 kg, SpO2 96%.  Medical Problem List and Plan: 1. 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  Status post left frontal temporal parietal craniotomy evacuation of SDH 02/01/2024 per Dr. Alm Molt.  JP drain removed 02/03/2024.  Decadron  taper as indicated  -patient may *** shower  -ELOS/Goals: *** 2.  Antithrombotics: -DVT/anticoagulation:  Mechanical: Antiembolism stockings, thigh (TED hose) Bilateral lower extremities  -antiplatelet therapy: N/A 3. Pain Management: Hydrocodone  5-325 mg 1 tablet every 4 hours as needed 4. Mood/Behavior/Sleep: Klonopin  0.5 mg nightly  -antipsychotic agents: N/A 5. Neuropsych/cognition: This patient is not capable of making decisions on his own behalf. 6. Skin/Wound Care: Routine skin checks 7. Fluids/Electrolytes/Nutrition: Routine and analysis with follow-up chemistries 8.  Seizure prophylaxis.  Keppra  500 mg twice daily 9.  Hyperlipidemia.  Lipitor 10.  Hypertension.  Cozaar  50 mg daily.  Monitor with increased mobility 11.  Left detached retina.  Recent eye surgery.  Follow-up outpatient ophthalmology Dr. Rankin Burke [phone] 12.  Class I obesity.  BMI 31.32.  Dietary follow-up 13.  Constipation.  Senokot 1 tablet twice daily, Colace 100 mg twice daily 14.  BPH.  Check PVR.  Patient on Proscar  5 mg daily prior to admission   Toribio JINNY Pitch, PA-C 02/06/2024

## 2024-02-06 NOTE — Progress Notes (Addendum)
   02/06/24 1401  Hand-off documentation  Hand-off Given Given to Transfer Unit/facility ([facility])  Report given to (Full Name) Charmaine, LPN   Pt transferred to 4W-15 with all personal belongings via wheelchair with RN. Pt's paper chart tubed to 4W, tube station 40. Pt in recliner with family at bedside, and 4W secretary/staff notified of pt's arrival on unit.

## 2024-02-06 NOTE — H&P (Signed)
 Physical Medicine and Rehabilitation Admission H&P         Chief Complaint  Patient presents with   Weakness      Right sided weakness   Aphasia  : HPI: Edwin Dickerson is a 73 year old right-handed male with history of hyperlipidemia, TIA, BPH,, class I obesity BMI 31.32, left detached retina and recent eye surgery per Dr. Rankin Burke.  Per chart review patient lives with spouse.  Two-level home bed and bath main level.  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  He completed eighth grade however family reports he has difficulty reading and writing at baseline.  Presented 02/01/2024 with progressive altered mental status/word finding difficulties and right side weakness.  By report patient had fallen from his bed and struck his head approximately 4 weeks ago with persistent headaches.  Cranial CT scan showed mixed density subdural hematoma centered along the left frontal convexity (measuring up to 2.3 mm in thickness).  Mass effect upon the underlying brain parenchyma with 8 mm rightward midline shift.  Mild cerebral atrophy.  Underwent left frontotemporoparietal craniotomy for evacuation of subdural hematoma 02/01/2024 per Dr. Alm Molt.  Postoperative cranial CT scan 02/02/2024 showed small new subarachnoid and subdural hemorrhage slight decrease in maximal thickness of subdural hematoma from 22 mm to 20 mm.  Mild interval improvement of shift of midline structures to the right now measuring 4 mm (previously 8 mm) and advised continued monitoring per neurosurgery.  Decadron  protocol as indicated.  Keppra  maintained for seizure prophylaxis.  A follow-up cranial CT scan repeated 02/03/2024 showing postoperative changes from evacuation of left SDH.  The SDH overall appeared a bit smaller compared with prior scan of 02/02/2024.  The 4 mm midline shift was unchanged.  No change in subarachnoid hemorrhage.  JP drain was removed 02/03/2024.  Tolerating a regular consistency diet.  Therapy evaluations completed due to patient  decreased functional mobility was admitted for a comprehensive rehab program.   Review of Systems  Constitutional:  Negative for chills and fever.  HENT:  Negative for hearing loss.   Eyes:  Positive for blurred vision. Negative for double vision.  Respiratory:  Negative for cough, shortness of breath and wheezing.   Cardiovascular:  Negative for chest pain, palpitations and leg swelling.  Gastrointestinal:  Positive for constipation. Negative for heartburn, nausea and vomiting.  Genitourinary:  Positive for urgency. Negative for dysuria, flank pain and hematuria.  Musculoskeletal:  Positive for myalgias.       Recent fall approximately 4 weeks ago from the bed striking his head on the nightstand  Skin:  Negative for rash.  Neurological:  Positive for speech change and weakness.  Psychiatric/Behavioral:  The patient has insomnia.   All other systems reviewed and are negative.      Past Medical History:  Diagnosis Date   High cholesterol     TIA (transient ischemic attack)               Past Surgical History:  Procedure Laterality Date   CRANIOTOMY Left 02/01/2024    Procedure: CRANIOTOMY HEMATOMA EVACUATION SUBDURAL;  Surgeon: Molt Alm Hamilton, MD;  Location: [facility] OR;  Service: Neurosurgery;  Laterality: Left;   PARS PLANA REPAIR OF RETINAL DEATACHMENT            History reviewed. No pertinent family history.     Social History:  reports that he has never smoked. He has never used smokeless tobacco. He reports that he does not drink alcohol and does not use drugs. Allergies:  Allergies       Allergies  Allergen Reactions   Penicillins Hives, Rash and Other (See Comments)      blacked out            Medications Prior to Admission  Medication Sig Dispense Refill   atorvastatin  (LIPITOR) 40 MG tablet Take 1 tablet (40 mg total) by mouth daily at 6 PM. 90 tablet 3   cetirizine (ZYRTEC) 10 MG tablet TAKE 1 TABLET BY MOUTH ONCE DAILY AS NEEDED FOR ALLERGIES        clonazePAM  (KLONOPIN ) 0.5 MG tablet Take 0.5 mg by mouth at bedtime.       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 ADLs Comments: Ind with ADL and iADL.   Functional Status:  Mobility: Bed Mobility Overal bed mobility: Needs Assistance Bed Mobility: Supine to Sit Supine to sit: Min assist General bed mobility comments: In recliner at start and end of session Transfers Overall transfer level: Needs assistance Transfers: Sit to/from Stand Sit to Stand: Contact guard assist Bed to/from chair/wheelchair/BSC transfer type:: Step pivot Step pivot transfers: Min assist General transfer comment: Increased time for rise. CGA to steady when standing. Ambulation/Gait Ambulation/Gait assistance: Min assist Gait Distance (Feet): 220 Feet Gait Pattern/deviations: Decreased stride length, Step-through pattern, Drifts right/left General Gait Details: Max cues for pt to look R. Assist to correct gait to walk straight as pt drifted R. No overt LOB during ambulation   ADL: ADL Overall ADL's : Needs assistance/impaired Eating/Feeding:  Moderate assistance, Sitting Grooming: Moderate assistance, Standing Upper Body Bathing: Moderate assistance, Sitting Lower Body Bathing: Moderate assistance, Sit to/from stand Upper Body Dressing : Moderate assistance, Sitting Lower Body Dressing: Maximal assistance, Sit to/from stand Toilet Transfer: Minimal assistance, Stand-pivot, BSC/3in1   Cognition: Cognition Overall Cognitive Status: Impaired/Different from baseline Arousal/Alertness: Awake/alert Orientation Level: Oriented X4 Attention: Sustained Sustained Attention: Appears intact Memory:  (will assess further in tx) Awareness: Impaired Awareness Impairment: Emergent impairment (decreased awareness of errors) Problem Solving:  (will assess further in diagnostic tx) Safety/Judgment: Impaired Cognition Arousal: Alert Behavior During Therapy: WFL for tasks assessed/performed Overall Cognitive Status: Impaired/Different from baseline   Physical Exam: Blood pressure (!) 142/82, pulse (!) 53, temperature 98.1 F (36.7 C), temperature source Oral, resp. rate 18, height 5' 7 (1.702 m), weight 90.7 kg, SpO2 96%. Physical Exam HENT:     Head:     Comments: Craniotomy site clean and dry with Aquacel dressing Neurological:     Comments: Patient is alert sitting up in bed.  Makes eye contact with examiner.  Follows simple commands.  Speech is fluent.  Provides name, date and age/date of birth.  Tone in the  upper and lower limbs is normal Visual fields are intact to confrontation testing Extraocular muscles are intact Motor strength is 5/5 bilateral deltoid bicep tricep grip as well as hip flexor knee extensor ankle dorsiflexor Sit to stand is with supervision Sensation intact light touch bilateral upper and lower limbs Cerebellar normal finger-nose-finger testing bilateral upper limbs Finger to thumb opposition intact bilateral upper limbs There is no evidence of dysdiadochokinesis with rapid alternating supination pronation  of bilateral upper extremities General no acute distress Mood and affect without lability or agitation Lungs are clear to auscultation Abdomen positive bowel sounds soft nontender to palpation Extremities without clubbing cyanosis or edema Musculoskeletal: Normal shoulder elbow wrist hand hip knee and ankle range of motion Negative straight leg raising bilaterally      Lab Results Last 48 Hours        Results for orders placed or performed during the hospital encounter of 02/01/24 (from the past 48 hours)  Glucose, capillary     Status: Abnormal    Collection Time: 02/04/24 12:11 PM  Result Value Ref Range    Glucose-Capillary 154 (H) 70 - 99 mg/dL      Comment: Glucose reference range applies only to samples taken after fasting for at least 8 hours.  Glucose, capillary     Status: Abnormal    Collection Time: 02/04/24  5:39 PM  Result Value Ref Range    Glucose-Capillary 173 (H) 70 - 99 mg/dL      Comment: Glucose reference range applies only to samples taken after fasting for at least 8 hours.  Glucose, capillary     Status: Abnormal    Collection Time: 02/04/24  9:10 PM  Result Value Ref Range    Glucose-Capillary 123 (H) 70 - 99 mg/dL      Comment: Glucose reference range applies only to samples taken after fasting for at least 8 hours.  Glucose, capillary     Status: Abnormal    Collection Time: 02/05/24  8:50 AM  Result Value Ref Range    Glucose-Capillary 126 (H) 70 - 99 mg/dL      Comment: Glucose reference range applies only to samples taken after fasting for at least 8 hours.  Basic metabolic panel with GFR     Status: Abnormal    Collection Time: 02/05/24 10:21 AM  Result Value Ref Range    Sodium 136 135 - 145 mmol/L    Potassium 4.1 3.5 - 5.1 mmol/L    Chloride 102 98 - 111 mmol/L    CO2 22 22 - 32 mmol/L    Glucose, Bld 123 (H) 70 - 99 mg/dL      Comment: Glucose reference range applies only to samples taken after fasting for at least 8 hours.    BUN 24 (H) 8  - 23 mg/dL    Creatinine, Ser 9.01 0.61 - 1.24 mg/dL    Calcium  8.9 8.9 - 10.3 mg/dL    GFR, Estimated >39 >39 mL/min      Comment: (NOTE) Calculated using the CKD-EPI Creatinine Equation (2021)      Anion gap 12 5 - 15      Comment: Performed at [facility], [address], [place], [place] [address]  CBC     Status: Abnormal    Collection Time: 02/05/24 10:21 AM  Result Value Ref Range    WBC 12.5 (H) 4.0 - 10.5 K/uL    RBC 4.48 4.22 - 5.81 MIL/uL    Hemoglobin 14.0 13.0 -  17.0 g/dL    HCT 60.6 60.9 - 47.9 %    MCV 87.7 80.0 - 100.0 fL    MCH 31.3 26.0 - 34.0 pg    MCHC 35.6 30.0 - 36.0 g/dL    RDW 87.4 88.4 - 84.4 %    Platelets 206 150 - 400 K/uL    nRBC 0.0 0.0 - 0.2 %      Comment: Performed at [facility], [address] [place], [place] [address]  Glucose, capillary     Status: Abnormal    Collection Time: 02/05/24 12:35 PM  Result Value Ref Range    Glucose-Capillary 168 (H) 70 - 99 mg/dL      Comment: Glucose reference range applies only to samples taken after fasting for at least 8 hours.  Glucose, capillary     Status: Abnormal    Collection Time: 02/05/24  4:35 PM  Result Value Ref Range    Glucose-Capillary 128 (H) 70 - 99 mg/dL      Comment: Glucose reference range applies only to samples taken after fasting for at least 8 hours.  Glucose, capillary     Status: Abnormal    Collection Time: 02/05/24  9:16 PM  Result Value Ref Range    Glucose-Capillary 142 (H) 70 - 99 mg/dL      Comment: Glucose reference range applies only to samples taken after fasting for at least 8 hours.      Imaging Results (Last 48 hours)  No results found.         Blood pressure (!) 142/82, pulse (!) 53, temperature 98.1 F (36.7 C), temperature source Oral, resp. rate 18, height 5' 7 (1.702 m), weight 90.7 kg, SpO2 96%.   Medical Problem List and Plan: 1. 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  Status post left frontal temporal parietal craniotomy  evacuation of SDH 02/01/2024 per Dr. Alm Molt.  JP drain removed 02/03/2024.  Decadron  taper as indicated             -patient may  shower with shower cap             -ELOS/Goals: 10-12d Supervision  2.  Antithrombotics: -DVT/anticoagulation:  Mechanical: Antiembolism stockings, thigh (TED hose) Bilateral lower extremities             -antiplatelet therapy: N/A 3. Pain Management: Hydrocodone  5-325 mg 1 tablet every 4 hours as needed 4. Mood/Behavior/Sleep: Klonopin  0.5 mg nightly             -antipsychotic agents: N/A 5. Neuropsych/cognition: This patient is not capable of making decisions on his own behalf. 6. Skin/Wound Care: Routine skin checks 7. Fluids/Electrolytes/Nutrition: Routine and analysis with follow-up chemistries 8.  Seizure prophylaxis.  Keppra  500 mg twice daily 9.  Hyperlipidemia.  Lipitor 10.  Hypertension.  Cozaar  50 mg daily.  Monitor with increased mobility 11.  Left detached retina.  Recent eye surgery.  Follow-up outpatient ophthalmology Dr. Rankin Burke [phone] 12.  Class I obesity.  BMI 31.32.  Dietary follow-up 13.  Constipation.  Senokot 1 tablet twice daily, Colace 100 mg twice daily 14.  BPH.  Check PVR.  Patient on Proscar  5 mg daily prior to admission     Toribio JINNY Pitch, PA-C 02/06/2024 I have personally performed a face to face diagnostic evaluation of this patient.  Additionally, I have reviewed and concur with the physician assistant's documentation above. Prentice CHARLENA Compton M.D. [facility] Fellow Am Acad of Phys Med and Rehab Diplomate  Am Board of Electrodiagnostic Med Fellow Am Board of Interventional Pain

## 2024-02-06 NOTE — Progress Notes (Signed)
 PROGRESS NOTE Edwin Dickerson  FMW:[id number] DOB: 1950-09-06 DOA: 02/01/2024 PCP: Hope Merle, MD (Inactive)  Brief Narrative/Hospital Course: Edwin Dickerson is a 73 y.o. year old male with PMH of  TIA and hyperlipidemia who fell out of bed earlier this summer and did strike his head on the nightstand afterwards had a small headache but no real symptoms around the end of August he developed some forgetfulness and word finding difficulty as well as progressive weakness in the right upper and lower extremities and see in in ED 9/3CT of the head demonstrated subdural hematoma with midline shift. Neurosurgery was consulted and s/p craniotomy and evacuation Patient was managed postoperatively in ICU with JP drain in place> subsequently removed on 9/5 Patient has been stabilized and transferred out of ICU to TRH service 9/7  Subjective: Seen and examined today No complaints no headache ambulating in the hallway with PT OT Overnight remains afebrile labs were checked yesterday with improving WBC count electrolytes normal  Assessment and plan:  Subdural hematoma left S/P craniotomy: Patient with midline shift from recent fall with traumatic SDH underwent craniotomy 9/3, JP drain out 9/5, patient has been extubated and transferred to MedSurg.  Continue plan of care as per primary neurosurgery team. Currently on Klonopin  bedtime, Keppra  twice daily, Decadron  4 mg every 8 hours. Continue fall precaution seizure precaution PT OT Disposition as per primary team  Hypertension: BP well-controlled on losartan  50mg .  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  Mobility: PT Orders: Active  PT Follow up Rec: Acute Inpatient Rehab (3hours/Day)02/03/2024 1449   DVT prophylaxis: SCDs Start: 02/01/24 2156 Code Status:   Code Status: Full Code Family  Communication: plan of care discussed with patient at bedside. Patient status is: Remains hospitalized because of severity of illness Level of care: Med-Surg   Dispo: The patient is from: home            Anticipated disposition: CIR  Objective: Vitals last 24 hrs: Vitals:   02/05/24 1921 02/05/24 2259 02/06/24 0302 02/06/24 0814  BP: (!) 155/96 (!) 149/86 (!) 142/82 123/71  Pulse: (!) 58 (!) 57 (!) 53 75  Resp: 18 16 18 20   Temp: 97.9 F (36.6 C) 97.9 F (36.6 C) 98.1 F (36.7 C) 98.1 F (36.7 C)  TempSrc: Oral Oral Oral Oral  SpO2: 95% 96% 96% 95%  Weight:      Height:        Physical Examination: General exam: AAOX3 HEENT:Oral mucosa moist, Ear/Nose WNL grossly Respiratory system: Bilaterally clear BS,no use of accessory muscle Cardiovascular system: S1 & S2 +, No JVD. Gastrointestinal system: Abdomen soft,NT,ND, BS+ Nervous System: Alert, awake, moving all extremities,and following commands. Extremities: LE edema neg, distal extremities warm.  Skin: No rashes,no icterus. MSK: Normal muscle bulk,tone, power, and surgical site with dressing C/D/I.  Medications reviewed:  Scheduled Meds:  atorvastatin   40 mg Oral Daily   Chlorhexidine  Gluconate Cloth  6 each Topical Daily   clonazePAM   0.5 mg Oral QHS   dexamethasone   4 mg Oral Q8H   docusate sodium   100 mg Oral BID   insulin  aspart  0-9 Units Subcutaneous TID WC   levETIRAcetam   500 mg Oral BID   loratadine   10 mg Oral Daily   losartan   50 mg Oral Daily   pantoprazole   40 mg Oral Daily   senna  1 tablet Oral  BID   Continuous Infusions: Diet: Diet Order             Diet regular Room service appropriate? Yes; Fluid consistency: Thin  Diet effective now                    Data Reviewed: I have personally reviewed following labs and imaging studies ( see epic result tab) CBC: Recent Labs  Lab 02/01/24 1552 02/01/24 1558 02/03/24 0902 02/05/24 1021  WBC 8.9  --  15.4* 12.5*  NEUTROABS 7.4  --   --    --   HGB 14.6 13.6 13.4 14.0  HCT 41.5 40.0 38.3* 39.3  MCV 87.2  --  88.9 87.7  PLT 215  --  187 206   CMP: Recent Labs  Lab 02/01/24 1552 02/01/24 1558 02/03/24 0902 02/05/24 1021  NA 141 139 139 136  K 3.9 3.9 4.0 4.1  CL 106 106 106 102  CO2 20*  --  21* 22  GLUCOSE 100* 102* 184* 123*  BUN 15 16 16  24*  CREATININE 1.11 1.10 1.09 0.98  CALCIUM  9.1  --  9.2 8.9  MG  --   --  2.0  --   PHOS  --   --  3.3  --    GFR: Estimated Creatinine Clearance: 69.9 mL/min (by C-G formula based on SCr of 0.98 mg/dL). Recent Labs  Lab 02/01/24 1552  AST 21  ALT 22  ALKPHOS 71  BILITOT 2.3*  PROT 6.7  ALBUMIN 4.2   No results for input(s): LIPASE, AMYLASE in the last 168 hours. No results for input(s): AMMONIA in the last 168 hours. Coagulation Profile:  Recent Labs  Lab 02/01/24 1552  INR 1.0   Unresulted Labs (From admission, onward)     Start     Ordered   Signed and Held  Comprehensive metabolic panel  Tomorrow morning,   R       Question:  Specimen collection method  Answer:  Lab=Lab collect   Signed and Held   Signed and Held  CBC with Differential/Platelet  Tomorrow morning,   R       Question:  Specimen collection method  Answer:  Lab=Lab collect   Signed and Held           Antimicrobials/Microbiology: Anti-infectives (From admission, onward)    None      No results found for: SDES, SPECREQUEST, CULT, REPTSTATUS  Procedures: Procedure(s) (LRB): CRANIOTOMY HEMATOMA EVACUATION SUBDURAL (Left)   Mennie LAMY, MD [facility] 02/06/2024, 10:19 AM

## 2024-02-06 NOTE — Discharge Summary (Signed)
 Physician Discharge Summary  Patient ID: Edwin Dickerson MRN: [id number] DOB/AGE: 75-Jun-1952 75 y.o.  Admit date: 02/01/2024 Discharge date: 02/06/2024  Admission Diagnoses: subdural hematoma    Discharge Diagnoses: same   Discharged Condition: good  Hospital Course: The patient was admitted on 02/01/2024 and taken to the operating room where the patient underwent crani for SDH. The patient tolerated the procedure well and was taken to the recovery room and then to the ICU in stable condition. The hospital course was routine. There were no complications. The wound remained clean dry and intact. Pt had appropriate head soreness. No complaints of new pain or new N/T/W. The patient remained afebrile with stable vital signs, and tolerated a regular diet. The patient continued to increase activities, and pain was well controlled with oral pain medications.   Consults: None  Significant Diagnostic Studies:  Results for orders placed or performed during the hospital encounter of 02/01/24  Protime-INR   Collection Time: 02/01/24  3:52 PM  Result Value Ref Range   Prothrombin Time 13.5 11.4 - 15.2 seconds   INR 1.0 0.8 - 1.2  APTT   Collection Time: 02/01/24  3:52 PM  Result Value Ref Range   aPTT 26 24 - 36 seconds  CBC   Collection Time: 02/01/24  3:52 PM  Result Value Ref Range   WBC 8.9 4.0 - 10.5 K/uL   RBC 4.76 4.22 - 5.81 MIL/uL   Hemoglobin 14.6 13.0 - 17.0 g/dL   HCT 58.4 60.9 - 47.9 %   MCV 87.2 80.0 - 100.0 fL   MCH 30.7 26.0 - 34.0 pg   MCHC 35.2 30.0 - 36.0 g/dL   RDW 87.3 88.4 - 84.4 %   Platelets 215 150 - 400 K/uL   nRBC 0.0 0.0 - 0.2 %  Differential   Collection Time: 02/01/24  3:52 PM  Result Value Ref Range   Neutrophils Relative % 82 %   Neutro Abs 7.4 1.7 - 7.7 K/uL   Lymphocytes Relative 9 %   Lymphs Abs 0.8 0.7 - 4.0 K/uL   Monocytes Relative 6 %   Monocytes Absolute 0.5 0.1 - 1.0 K/uL   Eosinophils Relative 1 %   Eosinophils Absolute 0.1 0.0 - 0.5 K/uL    Basophils Relative 1 %   Basophils Absolute 0.1 0.0 - 0.1 K/uL   Immature Granulocytes 1 %   Abs Immature Granulocytes 0.06 0.00 - 0.07 K/uL  Comprehensive metabolic panel   Collection Time: 02/01/24  3:52 PM  Result Value Ref Range   Sodium 141 135 - 145 mmol/L   Potassium 3.9 3.5 - 5.1 mmol/L   Chloride 106 98 - 111 mmol/L   CO2 20 (L) 22 - 32 mmol/L   Glucose, Bld 100 (H) 70 - 99 mg/dL   BUN 15 8 - 23 mg/dL   Creatinine, Ser 8.88 0.61 - 1.24 mg/dL   Calcium  9.1 8.9 - 10.3 mg/dL   Total Protein 6.7 6.5 - 8.1 g/dL   Albumin 4.2 3.5 - 5.0 g/dL   AST 21 15 - 41 U/L   ALT 22 0 - 44 U/L   Alkaline Phosphatase 71 38 - 126 U/L   Total Bilirubin 2.3 (H) 0.0 - 1.2 mg/dL   GFR, Estimated >39 >39 mL/min   Anion gap 15 5 - 15  I-stat chem 8, ED   Collection Time: 02/01/24  3:58 PM  Result Value Ref Range   Sodium 139 135 - 145 mmol/L   Potassium 3.9 3.5 - 5.1  mmol/L   Chloride 106 98 - 111 mmol/L   BUN 16 8 - 23 mg/dL   Creatinine, Ser 8.89 0.61 - 1.24 mg/dL   Glucose, Bld 897 (H) 70 - 99 mg/dL   Calcium , Ion 1.15 1.15 - 1.40 mmol/L   TCO2 21 (L) 22 - 32 mmol/L   Hemoglobin 13.6 13.0 - 17.0 g/dL   HCT 59.9 60.9 - 47.9 %  Type and screen  [facility]   Collection Time: 02/01/24  7:05 PM  Result Value Ref Range   ABO/RH(D) O POS    Antibody Screen NEG    Sample Expiration      02/04/2024,2359 Performed at [facility], [address] [place], [place] [address]   ABO/Rh   Collection Time: 02/01/24  7:10 PM  Result Value Ref Range   ABO/RH(D)      O POS Performed at [facility], [address], [place], [place] [address]   MRSA Next Gen by PCR, Nasal   Collection Time: 02/01/24  9:49 PM   Specimen: Nasal Mucosa; Nasal Swab  Result Value Ref Range   MRSA by PCR Next Gen NOT DETECTED NOT DETECTED  CBC   Collection Time: 02/03/24  9:02 AM  Result Value Ref Range   WBC 15.4 (H) 4.0 - 10.5 K/uL   RBC 4.31 4.22 - 5.81 MIL/uL   Hemoglobin 13.4  13.0 - 17.0 g/dL   HCT 61.6 (L) 60.9 - 47.9 %   MCV 88.9 80.0 - 100.0 fL   MCH 31.1 26.0 - 34.0 pg   MCHC 35.0 30.0 - 36.0 g/dL   RDW 87.1 88.4 - 84.4 %   Platelets 187 150 - 400 K/uL   nRBC 0.0 0.0 - 0.2 %  Basic metabolic panel   Collection Time: 02/03/24  9:02 AM  Result Value Ref Range   Sodium 139 135 - 145 mmol/L   Potassium 4.0 3.5 - 5.1 mmol/L   Chloride 106 98 - 111 mmol/L   CO2 21 (L) 22 - 32 mmol/L   Glucose, Bld 184 (H) 70 - 99 mg/dL   BUN 16 8 - 23 mg/dL   Creatinine, Ser 8.90 0.61 - 1.24 mg/dL   Calcium  9.2 8.9 - 10.3 mg/dL   GFR, Estimated >39 >39 mL/min   Anion gap 12 5 - 15  Magnesium    Collection Time: 02/03/24  9:02 AM  Result Value Ref Range   Magnesium  2.0 1.7 - 2.4 mg/dL  Phosphorus   Collection Time: 02/03/24  9:02 AM  Result Value Ref Range   Phosphorus 3.3 2.5 - 4.6 mg/dL  Glucose, capillary   Collection Time: 02/04/24 12:11 PM  Result Value Ref Range   Glucose-Capillary 154 (H) 70 - 99 mg/dL  Glucose, capillary   Collection Time: 02/04/24  5:39 PM  Result Value Ref Range   Glucose-Capillary 173 (H) 70 - 99 mg/dL  Glucose, capillary   Collection Time: 02/04/24  9:10 PM  Result Value Ref Range   Glucose-Capillary 123 (H) 70 - 99 mg/dL  Glucose, capillary   Collection Time: 02/05/24  8:50 AM  Result Value Ref Range   Glucose-Capillary 126 (H) 70 - 99 mg/dL  Basic metabolic panel with GFR   Collection Time: 02/05/24 10:21 AM  Result Value Ref Range   Sodium 136 135 - 145 mmol/L   Potassium 4.1 3.5 - 5.1 mmol/L   Chloride 102 98 - 111 mmol/L   CO2 22 22 - 32 mmol/L   Glucose,  Bld 123 (H) 70 - 99 mg/dL   BUN 24 (H) 8 - 23 mg/dL   Creatinine, Ser 9.01 0.61 - 1.24 mg/dL   Calcium  8.9 8.9 - 10.3 mg/dL   GFR, Estimated >39 >39 mL/min   Anion gap 12 5 - 15  CBC   Collection Time: 02/05/24 10:21 AM  Result Value Ref Range   WBC 12.5 (H) 4.0 - 10.5 K/uL   RBC 4.48 4.22 - 5.81 MIL/uL   Hemoglobin 14.0 13.0 - 17.0 g/dL   HCT 60.6 60.9 - 47.9  %   MCV 87.7 80.0 - 100.0 fL   MCH 31.3 26.0 - 34.0 pg   MCHC 35.6 30.0 - 36.0 g/dL   RDW 87.4 88.4 - 84.4 %   Platelets 206 150 - 400 K/uL   nRBC 0.0 0.0 - 0.2 %  Glucose, capillary   Collection Time: 02/05/24 12:35 PM  Result Value Ref Range   Glucose-Capillary 168 (H) 70 - 99 mg/dL  Glucose, capillary   Collection Time: 02/05/24  4:35 PM  Result Value Ref Range   Glucose-Capillary 128 (H) 70 - 99 mg/dL  Glucose, capillary   Collection Time: 02/05/24  9:16 PM  Result Value Ref Range   Glucose-Capillary 142 (H) 70 - 99 mg/dL  Glucose, capillary   Collection Time: 02/06/24  8:17 AM  Result Value Ref Range   Glucose-Capillary 169 (H) 70 - 99 mg/dL    CT HEAD WO CONTRAST ( ) Result Date: 02/03/2024 CLINICAL DATA:  Subdural hematoma EXAM: CT HEAD WITHOUT CONTRAST TECHNIQUE: Contiguous axial images were obtained from the base of the skull through the vertex without intravenous contrast. RADIATION DOSE REDUCTION: This exam was performed according to the departmental dose-optimization program which includes automated exposure control, adjustment of the mA and/or kV according to patient size and/or use of iterative reconstruction technique. COMPARISON:  02/02/2024 FINDINGS: There has been a left frontal burr-hole. There is left cerebral convexity subdural hematoma/air with a maximum thickness of approximately 10 mm. There is a 4 mm left-to-right midline shift and slight depression of the left lateral ventricle. There is a small amount of subarachnoid hemorrhage in the sylvian fissure and over the frontal convexity which is unchanged. IMPRESSION: Postoperative changes from evacuation of left subdural hematoma. The subdural hematoma overall appears a little smaller compared with yesterday. The 4 mm midline shift is unchanged. No change in subarachnoid hemorrhage. Electronically Signed   By: Nancyann Burns M.D.   On: 02/03/2024 12:22   CT HEAD WO CONTRAST Result Date: 02/02/2024 EXAM: CT HEAD  WITHOUT CONTRAST 02/02/2024 06:21:21 AM TECHNIQUE: CT of the head was performed without the administration of intravenous contrast. Automated exposure control, iterative reconstruction, and/or weight based adjustment of the mA/kV was utilized to reduce the radiation dose to as low as reasonably achievable. COMPARISON: CT of the head dated 02/01/2024. CLINICAL HISTORY: Subdural hematoma. FINDINGS: BRAIN AND VENTRICLES: Since the previous study, the patient has undergone left frontoparietal craniotomy for evacuation of a left subdural hematoma. A drainage catheter has been placed in the interim. There is new subarachnoid and subdural hemorrhage present. Overall, the subdural hematoma is slightly decreased in maximal thickness on the coronal images from 22 mm to 20 mm. There has been mild interval improvement of shift of midline structures to the right, now measuring 4 mm. Previously the midline shift measured up to 8 mm. ORBITS: No acute abnormality. SINUSES: No acute abnormality. SOFT TISSUES AND SKULL: No acute soft tissue abnormality. Postoperative changes of left frontoparietal craniotomy.  IMPRESSION: 1. Status post left frontoparietal craniotomy for evacuation of a left subdural hematoma with placement of a drainage catheter. 2. New subarachnoid and subdural hemorrhage. 3. Slight decrease in maximal thickness of the subdural hematoma from 22 mm to 20 mm. 4. Mild interval improvement of shift of midline structures to the right, now measuring 4 mm (previously 8 mm). Electronically signed by: Evalene Coho MD 02/02/2024 06:50 AM EDT RP Workstation: [id number]   CT HEAD WO CONTRAST Addendum Date: 02/01/2024 ADDENDUM REPORT: 02/01/2024 17:18 ADDENDUM: Impression #1 called by telephone on 02/01/2024 at 5:17 pm to provider Dr. Neysa, who verbally acknowledged these results. Electronically Signed   By: Rockey Childs D.O.   On: 02/01/2024 17:18   Result Date: 02/01/2024 CLINICAL DATA:  Provided history: Neuro deficit,  acute, stroke suspected. Right-sided weakness. Slurred speech. Right-sided facial droop. EXAM: CT HEAD WITHOUT CONTRAST TECHNIQUE: Contiguous axial images were obtained from the base of the skull through the vertex without intravenous contrast. RADIATION DOSE REDUCTION: This exam was performed according to the departmental dose-optimization program which includes automated exposure control, adjustment of the mA and/or kV according to patient size and/or use of iterative reconstruction technique. COMPARISON:  Brain MRI 10/23/2016. FINDINGS: Brain: Mild generalized cerebral atrophy. Mixed density subdural hematoma centered along the left frontal convexity (measuring up to 2.3 cm in thickness). The subdural hematoma contains low-density, intermediate density and hyperdense components. Mass effect upon the underlying brain parenchyma with 8 mm rightward midline shift. The basal cisterns remain patent. No demarcated cortical infarct. No evidence of an intracranial mass. Vascular: No hyperdense vessel.  Atherosclerotic calcifications. Skull: No calvarial fracture or aggressive osseous lesion. Sinuses/Orbits: No mass or acute finding within the imaged orbits. No significant paranasal sinus disease at the imaged levels. Attempts are being made to reach the ordering provider at this time. IMPRESSION: 1. Mixed density subdural hematoma centered along the left frontal convexity (measuring up to 2.3 mm in thickness). Mass effect upon the underlying brain parenchyma with 8 mm rightward midline shift. 2. Mild cerebral atrophy. Electronically Signed: By: Rockey Childs D.O. On: 02/01/2024 17:13    Antibiotics:  Anti-infectives (From admission, onward)    None       Discharge Exam: Blood pressure 123/71, pulse 75, temperature 98.1 F (36.7 C), temperature source Oral, resp. rate 20, height 5' 7 (1.702 m), weight 90.7 kg, SpO2 95%. Neurologic: Grossly normal Ambulating and voiding well incision cdi   Discharge  Medications:   Allergies as of 02/06/2024       Reactions   Penicillins Hives, Rash, Other (See Comments)   blacked out        Medication List     STOP taking these medications    cetirizine 10 MG tablet Commonly known as: ZYRTEC       TAKE these medications    atorvastatin  40 MG tablet Commonly known as: LIPITOR Take 1 tablet (40 mg total) by mouth daily at 6 PM.   clonazePAM  0.5 MG tablet Commonly known as: KLONOPIN  Take 0.5 mg by mouth at bedtime.   finasteride  5 MG tablet Commonly known as: PROSCAR  TAKE 1 TABLET BY MOUTH ONCE DAILY FOR ENLARGED PROSTATE        Disposition: rehab   Final Dx: Craniotomy for SDH  Discharge Instructions     Call MD for:  difficulty breathing, headache or visual disturbances   Complete by: As directed    Call MD for:  persistant nausea and vomiting   Complete by: As directed  Call MD for:  redness, tenderness, or signs of infection (pain, swelling, redness, odor or green/yellow discharge around incision site)   Complete by: As directed    Call MD for:  severe uncontrolled pain   Complete by: As directed    Call MD for:  temperature >100.4   Complete by: As directed    Diet - low sodium heart healthy   Complete by: As directed    Increase activity slowly   Complete by: As directed    No wound care   Complete by: As directed           Signed: Suzen Lacks Ezma Rehm 02/06/2024, 10:51 AM

## 2024-02-06 NOTE — Progress Notes (Signed)
 Physical Therapy Treatment Patient Details Name: Edwin Dickerson MRN: [id number] DOB: 01-Nov-1950 Today's Date: 02/06/2024   History of Present Illness 73 year old male admitted 9/3 with progressive confusion and R weakness over the last couple days. fall OOB with head trauma x4 weeks ago. 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 S/p Left frontotemporoparietal craniotomy for evacuation of subdural hematoma on 9/3. PMH includes: TIA, HLD.    PT Comments  Pt in recliner at start of session and agreeable to therapy. Pt progressing mobility well during today's session. Pt still intermittently drifts R during ambulation, especially in busier environment, CGA to min A to correct. Pt was CGA while ascending stairs but needed heavier assist descending. PT gave modA to correct balance and educated on step-to pattern. Pt to benefit from further PT to progress mobility and increase safety to return to independence. PT to follow acutely.    If plan is discharge home, recommend the following: A little help with walking and/or transfers;Assistance with cooking/housework;Assist for transportation;Help with stairs or ramp for entrance;A little help with bathing/dressing/bathroom   Can travel by private vehicle        Equipment Recommendations       Recommendations for Other Services       Precautions / Restrictions Precautions Precautions: Fall Recall of Precautions/Restrictions: Impaired Restrictions Weight Bearing Restrictions Per Provider Order: No     Mobility  Bed Mobility Overal bed mobility: Needs Assistance             General bed mobility comments: In recliner at start and end of session    Transfers Overall transfer level: Needs assistance     Sit to Stand: Contact guard assist           General transfer comment: Sit to stand x2. CGA to steady and for safety. Use of rail in bathroom    Ambulation/Gait Ambulation/Gait assistance: Min assist Gait  Distance (Feet): 220 Feet   Gait Pattern/deviations: Step-through pattern, Decreased stride length, Drifts right/left       General Gait Details: Pt using compensatory hip adduction to progress RLE during gait. Drifts R>L. CGA to minA to correct. Inattention and command following has higher difficulty in busier environment.   Stairs Stairs: Yes Stairs assistance: Mod assist, Contact guard assist Stair Management: One rail Right, Alternating pattern, Step to pattern Number of Stairs: 8 General stair comments: Contact guard while ascending with alternating pattern. ModA descending to correct balance. Cues to descend with step to pattern (RLE first).   Wheelchair Mobility     Tilt Bed    Modified Rankin (Stroke Patients Only)       Balance Overall balance assessment: Needs assistance Sitting-balance support: Feet supported Sitting balance-Leahy Scale: Good     Standing balance support: Single extremity supported Standing balance-Leahy Scale: Fair                              Hotel manager: Impaired Factors Affecting Communication: Difficulty expressing self  Cognition Arousal: Alert Behavior During Therapy: WFL for tasks assessed/performed   PT - Cognitive impairments: Safety/Judgement                         Following commands: Impaired Following commands impaired: Follows multi-step commands inconsistently    Cueing Cueing Techniques: Verbal cues, Gestural cues  Exercises      General Comments General comments (skin integrity, edema, etc.): VSS on  RA      Pertinent Vitals/Pain Pain Assessment Pain Assessment: Faces Faces Pain Scale: No hurt    Home Living                          Prior Function            PT Goals (current goals can now be found in the care plan section) Acute Rehab PT Goals Patient Stated Goal: Return to independence PT Goal Formulation: With patient/family Time For  Goal Achievement: 02/17/24 Potential to Achieve Goals: Good Progress towards PT goals: Progressing toward goals    Frequency    Min 3X/week      PT Plan      Co-evaluation              AM-PAC PT 6 Clicks Mobility   Outcome Measure  Help needed turning from your back to your side while in a flat bed without using bedrails?: A Little Help needed moving from lying on your back to sitting on the side of a flat bed without using bedrails?: A Little Help needed moving to and from a bed to a chair (including a wheelchair)?: A Little Help needed standing up from a chair using your arms (e.g., wheelchair or bedside chair)?: A Little Help needed to walk in hospital room?: A Little Help needed climbing 3-5 steps with a railing? : A Lot 6 Click Score: 17    End of Session Equipment Utilized During Treatment: Gait belt Activity Tolerance: Patient tolerated treatment well Patient left: in chair;with call bell/phone within reach;with chair alarm set Nurse Communication: Mobility status PT Visit Diagnosis: Other abnormalities of gait and mobility (R26.89);Muscle weakness (generalized) (M62.81)     Time: 9082-9059 PT Time Calculation (min) (ACUTE ONLY): 23 min  Charges:    $Gait Training: 8-22 mins $Therapeutic Activity: 8-22 mins PT General Charges $$ ACUTE PT VISIT: 1 Visit                     Quintin Campi, SPT  Acute Rehab  [phone]    Quintin Campi 02/06/2024, 11:40 AM

## 2024-02-06 NOTE — Care Management Important Message (Signed)
 Important Message  Patient Details  Name: Edwin Dickerson MRN: [id number] Date of Birth: 06-Aug-1950   Important Message Given:  Yes - Medicare IM     Jon Cruel 02/06/2024, 12:43 PM

## 2024-02-06 NOTE — TOC Transition Note (Signed)
 Transition of Care (TOC) - Discharge Note Rayfield Gobble RN,BSN Inpatient Care Management Unit 4NP (Non Trauma)- RN Case Manager See Treatment Team for direct Phone #   Patient Details  Name: Edwin Dickerson MRN: [id number] Date of Birth: 11/27/1950  Transition of Care [facility]) CM/SW Contact:  Gobble Rayfield Hurst, RN Phone Number: 02/06/2024, 11:15 AM   Clinical Narrative:    Notified by Davene CALKINS rehab liaison this am that pt has bed available for admit to rehab today. Pt and wife agreeable and pt medically stable for transition to INPT rehab.  Pt will transition later today once bed assigned on [facility] re[facility].  No further CM needs noted for discharge.    Final next level of care: IP Rehab Facility Barriers to Discharge: Barriers Resolved   Patient Goals and CMS Choice Patient states their goals for this hospitalization and ongoing recovery are:: rehab then return home   Choice offered to / list presented to : Patient      Discharge Placement               [facility] rehab        Discharge Plan and Services Additional resources added to the After Visit Summary for     Discharge Planning Services: CM Consult Post Acute Care Choice: IP Rehab          DME Arranged: N/A DME Agency: NA       HH Arranged: NA HH Agency: NA        Social Drivers of Health (SDOH) Interventions SDOH Screenings   Food Insecurity: No Food Insecurity (02/04/2024)  Housing: Low Risk  (02/04/2024)  Transportation Needs: No Transportation Needs (02/04/2024)  Utilities: Not At Risk (02/04/2024)  Social Connections: Moderately Integrated (02/04/2024)  Tobacco Use: Low Risk  (02/01/2024)     Readmission Risk Interventions    02/06/2024   11:14 AM  Readmission Risk Prevention Plan  Medication Screening Complete  Transportation Screening Complete

## 2024-02-06 NOTE — Progress Notes (Signed)
 Occupational Therapy Treatment Patient Details Name: Edwin Dickerson MRN: [id number] DOB: December 11, 1950 Today's Date: 02/06/2024   History of present illness 73 year old male admitted 9/3 with progressive confusion and R weakness over the last couple days. fall OOB with head trauma x4 weeks ago. 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 S/p Left frontotemporoparietal craniotomy for evacuation of subdural hematoma on 9/3. PMH includes: TIA, HLD.   OT comments  Patient with good progress toward patient focused goals.  Improved AROM to R upper extremity during functional tasks.  Improved safety and command following, and improving balance.  Patient overall needing CGA for ADL and in room mobility/toileting.  Patient is still struggling with work finding, and OT will continue efforts in the acute setting.  Despite functional improvement, patient is still performing below his prior level.  Patient will benefit from intensive inpatient follow-up therapy, >3 hours/day.      If plan is discharge home, recommend the following:  A lot of help with walking and/or transfers;A lot of help with bathing/dressing/bathroom;Direct supervision/assist for medications management;Direct supervision/assist for financial management;Assist for transportation;Help with stairs or ramp for entrance;Supervision due to cognitive status   Equipment Recommendations       Recommendations for Other Services      Precautions / Restrictions Precautions Precautions: Fall Recall of Precautions/Restrictions: Impaired Restrictions Weight Bearing Restrictions Per Provider Order: No       Mobility Bed Mobility   Bed Mobility: Supine to Sit     Supine to sit: Supervision          Transfers Overall transfer level: Needs assistance Equipment used: None Transfers: Sit to/from Stand, Bed to chair/wheelchair/BSC Sit to Stand: Contact guard assist     Step pivot transfers: Contact guard assist            Balance Overall balance assessment: Needs assistance Sitting-balance support: Feet supported Sitting balance-Leahy Scale: Good     Standing balance support: Single extremity supported Standing balance-Leahy Scale: Poor                             ADL either performed or assessed with clinical judgement   ADL       Grooming: Contact guard assist;Standing           Upper Body Dressing : Contact guard assist;Standing   Lower Body Dressing: Contact guard assist;Sit to/from stand   Toilet Transfer: Contact guard assist;Ambulation;Regular Toilet                  Extremity/Trunk Assessment Upper Extremity Assessment Upper Extremity Assessment: Overall WFL for tasks assessed RUE Sensation: decreased light touch   Lower Extremity Assessment Lower Extremity Assessment: Defer to PT evaluation   Cervical / Trunk Assessment Cervical / Trunk Assessment: Normal    Vision Patient Visual Report: No change from baseline Additional Comments: Retinal detachment sx a few weeks ago, bubble in L visual field blurring vision.   Perception Perception Perception: Within Functional Limits   Praxis Praxis Praxis: WFL   Communication Communication Communication: Impaired Factors Affecting Communication: Difficulty expressing self   Cognition Arousal: Alert Behavior During Therapy: WFL for tasks assessed/performed Cognition: Cognition impaired       Memory impairment (select all impairments): Short-term memory Attention impairment (select first level of impairment): Alternating attention, Selective attention                     Following commands: Impaired Following commands  impaired: Follows multi-step commands inconsistently      Cueing   Cueing Techniques: Verbal cues, Gestural cues  Exercises      Shoulder Instructions       General Comments      Pertinent Vitals/ Pain       Pain Assessment Pain Assessment: No/denies  pain  Home Living                                          Prior Functioning/Environment              Frequency  Min 2X/week        Progress Toward Goals  OT Goals(current goals can now be found in the care plan section)  Progress towards OT goals: Progressing toward goals  Acute Rehab OT Goals OT Goal Formulation: With patient Time For Goal Achievement: 02/16/24 Potential to Achieve Goals: Good  Plan      Co-evaluation                 AM-PAC OT 6 Clicks Daily Activity     Outcome Measure   Help from another person eating meals?: A Little Help from another person taking care of personal grooming?: A Little Help from another person toileting, which includes using toliet, bedpan, or urinal?: A Little Help from another person bathing (including washing, rinsing, drying)?: A Little Help from another person to put on and taking off regular upper body clothing?: A Little Help from another person to put on and taking off regular lower body clothing?: A Little 6 Click Score: 18    End of Session Equipment Utilized During Treatment: Gait belt  OT Visit Diagnosis: Unsteadiness on feet (R26.81);Cognitive communication deficit (R41.841) Symptoms and signs involving cognitive functions: Nontraumatic SAH   Activity Tolerance Patient tolerated treatment well   Patient Left in chair;with call bell/phone within reach;with chair alarm set   Nurse Communication          Time: [phone] OT Time Calculation (min): 25 min  Charges: OT General Charges $OT Visit: 1 Visit OT Treatments $Self Care/Home Management : 23-37 mins  02/06/2024  RP, OTR/L  Acute Rehabilitation Services  Office:  [phone]   Charlie JONETTA Halsted 02/06/2024, 8:50 AM

## 2024-02-06 NOTE — Progress Notes (Signed)
 Inpatient Rehab Admissions Coordinator:   We have a bed available on CIR today and patient is medically stable. Will admit to CIR.   Rehab Admissons Coordinator Melania Kirks, [place], [place] [phone]

## 2024-02-06 NOTE — Progress Notes (Signed)
 PMR Admission Coordinator Pre-Admission Assessment   Patient: Edwin Dickerson is an 73 y.o., male MRN: [id number] DOB: 1950-07-05 Height: 5' 7 (170.2 cm) Weight: 90.7 kg   Insurance Information HMO:     PPO:      PCP:      IPA:      80/20:      OTHER:  PRIMARY: Medicare Part A and B      Policy#: [id number]      Subscriber: pt Benefits:  Phone #: passport one source online     Name: 9/5 Eff. Date: 08/29/2013     Deduct: $1676      Out of Pocket Max: none      Life Max: none CIR: 100%      SNF: 20 full days Outpatient: 80%     Co-Pay: 20% Home Health: 100%      Co-Pay: none DME: 80%     Co-Pay: 20% Providers: pt choice  SECONDARY: Tax adviser      Policy#: [id number]     Phone#:    Financial Counselor:       Phone#:    The "Data Collection Information Summary" for patients in Inpatient Rehabilitation Facilities with attached "Privacy Act Statement-Health Care Records" was provided and verbally reviewed with: Patient and Family   Emergency Contact Information Contact Information       Name Relation Home Work Mobile    Dickerson,Edwin Spouse [phone]   [phone]    Edwin, Dickerson     [phone]         Other Contacts   None on File      Current Medical History  Patient Admitting Diagnosis: SDH   History of Present Illness: Edwin Dickerson is a 73 year old right-handed male with history of hyperlipidemia, TIA, BPH,, class I obesity BMI 31.32, left detached retina and recent eye surgery per Dr. Rankin Burke.  Per chart review patient lives with spouse.  Presented 02/01/2024 with progressive altered mental status/word finding difficulties and right side weakness.  By report patient had fallen from his bed and struck his head approximately 4 weeks ago with persistent headaches.  Cranial CT scan showed mixed density subdural hematoma centered along the left frontal convexity (measuring up to 2.3 mm in thickness).  Mass effect upon the underlying brain parenchyma with 8  mm rightward midline shift.  Mild cerebral atrophy.  Underwent left frontotemporoparietal craniotomy for evacuation of subdural hematoma 02/01/2024 per Dr. Alm Molt.  Postoperative cranial CT scan 02/02/2024 showed small new subarachnoid and subdural hemorrhage slight decrease in maximal thickness of subdural hematoma from 22 mm to 20 mm.  Mild interval improvement of shift of midline structures to the right now measuring 4 mm (previously 8 mm) and advised continued monitoring per neurosurgery.  Decadron  protocol as indicated.  Keppra  maintained for seizure prophylaxis.  A follow-up cranial CT scan repeated 02/03/2024 showing postoperative changes from evacuation of left SDH.  The SDH overall appeared a bit smaller compared with prior scan of 02/02/2024.  The 4 mm midline shift was unchanged.  No change in subarachnoid hemorrhage.  JP drain was removed 02/03/2024.  Tolerating a regular consistency diet.  Therapy evaluations completed due to patient decreased functional mobility was admitted for a comprehensive rehab program.    Complete NIHSS TOTAL: 5   Patient's medical record from [facility] has been reviewed by the rehabilitation admission coordinator and physician.   Past Medical History      Past Medical History:  Diagnosis Date  High cholesterol     TIA (transient ischemic attack)          Has the patient had major surgery during 100 days prior to admission? Yes   Family History   family history is not on file.   Current Medications  Current Medications    Current Facility-Administered Medications:    acetaminophen  (TYLENOL ) tablet 650 mg, 650 mg, Oral, Q4H PRN, 650 mg at 02/02/24 2112 **OR** acetaminophen  (TYLENOL ) suppository 650 mg, 650 mg, Rectal, Q4H PRN, Meyran, Suzen Lacks, NP   atorvastatin  (LIPITOR) tablet 40 mg, 40 mg, Oral, Daily, Meyran, Suzen Lacks, NP, 40 mg at 02/03/24 1037   bisacodyl  (DULCOLAX) EC tablet 5 mg, 5 mg, Oral, Daily PRN, Meyran, Suzen Lacks, NP   Chlorhexidine  Gluconate Cloth 2 % PADS 6 each, 6 each, Topical, Daily, Joshua Alm Hamilton, MD, 6 each at 02/02/24 2000   [COMPLETED] dexamethasone  (DECADRON ) tablet 6 mg, 6 mg, Oral, Q6H, 6 mg at 02/02/24 1712 **FOLLOWED BY** dexamethasone  (DECADRON ) tablet 4 mg, 4 mg, Oral, Q6H, 4 mg at 02/03/24 1224 **FOLLOWED BY** [START ON 02/04/2024] dexamethasone  (DECADRON ) tablet 4 mg, 4 mg, Oral, Q8H, Meyran, Suzen Lacks, NP   docusate sodium  (COLACE) capsule 100 mg, 100 mg, Oral, BID, Meyran, Suzen Lacks, NP, 100 mg at 02/03/24 1037   hydrALAZINE  (APRESOLINE ) injection 10-20 mg, 10-20 mg, Intravenous, Q4H PRN, Meyran, Suzen Lacks, NP   HYDROcodone -acetaminophen  (NORCO/VICODIN) 5-325 MG per tablet 1 tablet, 1 tablet, Oral, Q4H PRN, Meyran, Suzen Lacks, NP, 1 tablet at 02/01/24 2321   HYDROmorphone  (DILAUDID ) injection 0.5-1 mg, 0.5-1 mg, Intravenous, Q2H PRN, Meyran, Suzen Lacks, NP, 1 mg at 02/02/24 0354   labetalol  (NORMODYNE ) injection 10-40 mg, 10-40 mg, Intravenous, Q10 min PRN, Meyran, Suzen Lacks, NP, 20 mg at 02/02/24 0032   levETIRAcetam  (KEPPRA ) tablet 500 mg, 500 mg, Oral, BID, Joshua Alm Hamilton, MD, 500 mg at 02/03/24 1037   ondansetron  (ZOFRAN ) tablet 4 mg, 4 mg, Oral, Q4H PRN **OR** ondansetron  (ZOFRAN ) injection 4 mg, 4 mg, Intravenous, Q4H PRN, Meyran, Suzen Lacks, NP   Oral care mouth rinse, 15 mL, Mouth Rinse, PRN, Joshua Alm Hamilton, MD   pantoprazole  (PROTONIX ) EC tablet 40 mg, 40 mg, Oral, Daily, Joshua Alm Hamilton, MD, 40 mg at 02/02/24 2111   senna (SENOKOT) tablet 8.6 mg, 1 tablet, Oral, BID, Meyran, Suzen Lacks, NP, 8.6 mg at 02/03/24 1037     Patients Current Diet:  Diet Order                  Diet regular Room service appropriate? Yes; Fluid consistency: Thin  Diet effective now                       Precautions / Restrictions Precautions Precautions: Fall Precaution/Restrictions Comments: Watch SBP Restrictions Weight  Bearing Restrictions Per Provider Order: No    Has the patient had 2 or more falls or a fall with injury in the past year? 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 Independent   Indoor Mobility: Did the patient need assistance with walking from room to room (with or without device)? Independent   Stairs: Did the patient need assistance with internal or external stairs (with or without device)? Independent   Functional Cognition: Did the patient need help planning regular tasks  such as shopping or remembering to take medications? Independent   Patient Information Are you of Hispanic, Latino/a,or Spanish origin?: A. No, not of Hispanic, Latino/a, or Spanish origin What is your race?: A. White Do you need or want an interpreter to communicate with a doctor or health care staff?: 0. No   Patient's Response To:  Health Literacy and Transportation Is the patient able to respond to health literacy and transportation needs?: Yes Health Literacy - How often do you need to have someone help you when you read instructions, pamphlets, or other written material from your doctor or pharmacy?: Sometimes In the past 12 months, has lack of transportation kept you from medical appointments or from getting medications?: No In the past 12 months, has lack of transportation kept you from meetings, work, or from getting things needed for daily living?: No   Journalist, newspaper / Equipment Home Equipment: None   Prior Device Use: Indicate devices/aids used by the patient prior to current illness, exacerbation or injury? None of the above   Current Functional Level Cognition   Arousal/Alertness: Awake/alert Overall Cognitive Status: Impaired/Different from baseline Orientation Level: Oriented to person, Disoriented to  place, Disoriented to time, Disoriented to situation Attention: Sustained Sustained Attention: Appears intact Memory:  (will assess further in tx) Awareness: Impaired Awareness Impairment: Emergent impairment (decreased awareness of errors) Problem Solving:  (will assess further in diagnostic tx) Safety/Judgment: Impaired    Extremity Assessment (includes Sensation/Coordination)   Upper Extremity Assessment: Defer to OT evaluation RUE Deficits / Details: Increasing tone to R arm. RUE Sensation: decreased proprioception RUE Coordination: decreased fine motor, decreased gross motor  Lower Extremity Assessment: Generalized weakness, RLE deficits/detail (At least 4/5 on LLE. Increased time and multimodal cues for MMTs) RLE Coordination: decreased gross motor     ADLs   Overall ADL's : Needs assistance/impaired Eating/Feeding: Moderate assistance, Sitting Grooming: Moderate assistance, Standing Upper Body Bathing: Moderate assistance, Sitting Lower Body Bathing: Moderate assistance, Sit to/from stand Upper Body Dressing : Moderate assistance, Sitting Lower Body Dressing: Maximal assistance, Sit to/from stand Toilet Transfer: Minimal assistance, Stand-pivot, BSC/3in1     Mobility   Overal bed mobility: Needs Assistance Bed Mobility: Supine to Sit Supine to sit: Min assist General bed mobility comments: In recliner at start and end of session     Transfers   Overall transfer level: Needs assistance Transfers: Sit to/from Stand Sit to Stand: Contact guard assist Bed to/from chair/wheelchair/BSC transfer type:: Step pivot Step pivot transfers: Min assist General transfer comment: Increased time for rise. CGA to steady when standing.     Ambulation / Gait / Stairs / Wheelchair Mobility   Ambulation/Gait Ambulation/Gait assistance: Editor, commissioning (Feet): 220 Feet Gait Pattern/deviations: Decreased stride length, Step-through pattern, Drifts right/left General Gait  Details: Max cues for pt to look R. Assist to correct gait to walk straight as pt drifted R. No overt LOB during ambulation     Posture / Balance Balance Overall balance assessment: Needs assistance Sitting-balance support: Feet supported Sitting balance-Leahy Scale: Fair Standing balance support: Single extremity supported Standing balance-Leahy Scale: Poor     Special considerations/life events       Previous Home Environment  Living Arrangements: Spouse/significant other  Lives With: Spouse Available Help at Discharge: Family, Available 24 hours/day Type of Home: House Home Layout: Two level, Able to live on main level with bedroom/bathroom Home Access: Stairs to enter Bathroom Shower/Tub: Engineer, manufacturing systems: Standard Bathroom Accessibility: Yes How Accessible: Accessible via walker  Home Care Services: No Additional Comments: 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, 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   Discharge Living Setting Plans for Discharge Living Setting: Patient's home, House, Lives with (comment) (wife) Type of Home at Discharge: House Discharge Home Layout: Two level, Able to live on main level with bedroom/bathroom Discharge Home Access: Stairs to enter Discharge Bathroom Shower/Tub: Tub/shower unit Discharge Bathroom Toilet: Standard Discharge Bathroom Accessibility: Yes How Accessible: Accessible via walker Does the patient have any problems obtaining your medications?: No   Social/Family/Support Systems Patient Roles: Spouse, Parent (farmer) Contact Information: wife, [name] Anticipated Caregiver: wife Anticipated Industrial/product designer Information: see contacts Ability/Limitations of Caregiver: spouse no limitations Caregiver Availability: 24/7 Discharge Plan Discussed with Primary Caregiver: Yes Is Caregiver In Agreement with Plan?: Yes Does Caregiver/Family have Issues with Lodging/Transportation while Pt is in Rehab?: No    Goals Patient/Family Goal for Rehab: supervision with PT, OT and SLP Expected length of stay: ELOS 10 to 12 days Pt/Family Agrees to Admission and willing to participate: Yes Program Orientation Provided & Reviewed with Pt/Caregiver Including Roles  & Responsibilities: Yes   Decrease burden of Care through IP rehab admission: n/a   Possible need for SNF placement upon discharge: not anticipated   Patient Condition: I have reviewed medical [facility], spoken with patient and spouse. I met with patient at the bedside for inpatient rehabilitation assessment.  Patient will benefit from ongoing PT, OT, and SLP, can actively participate in 3 hours of therapy a day 5 days of the week, and can make measurable gains during the admission.  Patient will also benefit from the coordinated team approach during an Inpatient Acute Rehabilitation admission.  The patient will receive intensive therapy as well as Rehabilitation physician, nursing, social worker, and care management interventions.  Due to bladder management, bowel management, safety, skin/wound care, disease management, medication administration, pain management, and patient education the patient requires 24 hour a day rehabilitation nursing.  The patient is currently min A with mobility and basic ADLs.  Discharge setting and therapy post discharge at home with outpatient is anticipated.  Patient has agreed to participate in the Acute Inpatient Rehabilitation Program and will admit today.   Preadmission Screen Completed By:  Alison Heron Lot RN MSN 02/03/2024 5:37 PM, updates provided by Josealfredo Adkins, PT on 02/06/24 ______________________________________________________________________   Discussed status with Dr. Carilyn on 02/06/24 at 9:00am and received approval for admission today.   Admission Coordinator:  Alison Heron Lot, RN MSN, updates provided by Cosmo Platter, PT time 10:36am/Date 02/06/24     Assessment/Plan: Diagnosis: Left frontal SDH Does the need for close, 24 hr/day Medical supervision in concert with the patient's rehab needs make it unreasonable for this patient to be served in a less intensive setting? Yes Co-Morbidities requiring supervision/potential complications: BPH, HTN, class 1 obesity  Due to bladder management, bowel management, safety, skin/wound care, disease management, medication administration, pain management, and patient education, does the patient require 24 hr/day rehab nursing? Yes Does the patient require coordinated care of a physician, rehab nurse, PT, OT, and SLP to address physical and functional deficits in the context of the above medical diagnosis(es)? Yes Addressing deficits in the following areas: balance, endurance, locomotion, strength, transferring, bowel/bladder control, bathing, dressing, feeding, grooming, toileting, and cognition Can the patient actively participate in an intensive therapy program of at least 3 hrs of therapy 5 days a week? Yes The potential for patient to make measurable  gains while on inpatient rehab is good Anticipated functional outcomes upon discharge from inpatient rehab: supervision PT, supervision OT, supervision SLP Estimated rehab length of stay to reach the above functional goals is: 10-12d Anticipated discharge destination: Home 10. Overall Rehab/Functional Prognosis: good     MD Signature: Prentice CHARLENA Compton M.D. [facility] Fellow Am Acad of Phys Med and Rehab Diplomate Am Board of Electrodiagnostic Med Fellow Am Board of Interventional Pain

## 2024-02-07 DIAGNOSIS — S065X9S Traumatic subdural hemorrhage with loss of consciousness of unspecified duration, sequela: Secondary | ICD-10-CM

## 2024-02-07 LAB — CBC WITH DIFFERENTIAL/PLATELET
Abs Immature Granulocytes: 0.4 K/uL — ABNORMAL HIGH (ref 0.00–0.07)
Basophils Absolute: 0 K/uL (ref 0.0–0.1)
Basophils Relative: 0 %
Eosinophils Absolute: 0 K/uL (ref 0.0–0.5)
Eosinophils Relative: 0 %
HCT: 39.2 % (ref 39.0–52.0)
Hemoglobin: 14.2 g/dL (ref 13.0–17.0)
Immature Granulocytes: 3 %
Lymphocytes Relative: 7 %
Lymphs Abs: 0.8 K/uL (ref 0.7–4.0)
MCH: 31.1 pg (ref 26.0–34.0)
MCHC: 36.2 g/dL — ABNORMAL HIGH (ref 30.0–36.0)
MCV: 86 fL (ref 80.0–100.0)
Monocytes Absolute: 0.7 K/uL (ref 0.1–1.0)
Monocytes Relative: 6 %
Neutro Abs: 10.1 K/uL — ABNORMAL HIGH (ref 1.7–7.7)
Neutrophils Relative %: 84 %
Platelets: 199 K/uL (ref 150–400)
RBC: 4.56 MIL/uL (ref 4.22–5.81)
RDW: 12.5 % (ref 11.5–15.5)
WBC: 12.1 K/uL — ABNORMAL HIGH (ref 4.0–10.5)
nRBC: 0 % (ref 0.0–0.2)

## 2024-02-07 LAB — COMPREHENSIVE METABOLIC PANEL WITH GFR
ALT: 28 U/L (ref 0–44)
AST: 18 U/L (ref 15–41)
Albumin: 3 g/dL — ABNORMAL LOW (ref 3.5–5.0)
Alkaline Phosphatase: 62 U/L (ref 38–126)
Anion gap: 9 (ref 5–15)
BUN: 26 mg/dL — ABNORMAL HIGH (ref 8–23)
CO2: 23 mmol/L (ref 22–32)
Calcium: 8.7 mg/dL — ABNORMAL LOW (ref 8.9–10.3)
Chloride: 104 mmol/L (ref 98–111)
Creatinine, Ser: 1.05 mg/dL (ref 0.61–1.24)
GFR, Estimated: >60 mL/min (ref >60)
Glucose, Bld: 129 mg/dL — ABNORMAL HIGH (ref 70–99)
Potassium: 4.1 mmol/L (ref 3.5–5.1)
Sodium: 136 mmol/L (ref 135–145)
Total Bilirubin: 1.4 mg/dL — ABNORMAL HIGH (ref 0.0–1.2)
Total Protein: 5.8 g/dL — ABNORMAL LOW (ref 6.5–8.1)

## 2024-02-07 LAB — GLUCOSE, CAPILLARY
Glucose-Capillary: 114 mg/dL — ABNORMAL HIGH (ref 70–99)
Glucose-Capillary: 111 mg/dL — ABNORMAL HIGH (ref 70–99)
Glucose-Capillary: 125 mg/dL — ABNORMAL HIGH (ref 70–99)
Glucose-Capillary: 133 mg/dL — ABNORMAL HIGH (ref 70–99)

## 2024-02-07 MED ORDER — ACETAMINOPHEN 325 MG PO TABS: 650.0000 mg | ORAL_TABLET | ORAL | Status: AC | PRN

## 2024-02-07 NOTE — Progress Notes (Signed)
 Patient ID: Edwin Dickerson, male   DOB: 22-Oct-1950, 73 y.o.   MRN: [id number] Met with the patient to review current medical situation, rehab process, team conference and plan of care. Discussed medications for HTN, HLD and seizure prophylaxis. Patient noted headache when lying on pillow and blurred vision better but still present.  Continue to follow along to address educational needs to facilitate preparation for discharge. Fredericka Barnie NOVAK

## 2024-02-07 NOTE — Progress Notes (Signed)
 Inpatient Rehabilitation Center Individual Statement of Services  Patient Name:  Edwin Dickerson  Date:  02/07/2024  Welcome to the Inpatient Rehabilitation Center.  Our goal is to provide you with an individualized program based on your diagnosis and situation, designed to meet your specific needs.  With this comprehensive rehabilitation program, you will be expected to participate in at least 3 hours of rehabilitation therapies Monday-Friday, with modified therapy programming on the weekends.  Your rehabilitation program will include the following services:  Physical Therapy (PT), Occupational Therapy (OT), Speech Therapy (ST), 24 hour per day rehabilitation nursing, Therapeutic Recreaction (TR), Care Coordinator, Rehabilitation Medicine, Nutrition Services, and Pharmacy Services  Weekly team conferences will be held on Wednesday to discuss your progress.  Your Inpatient Rehabilitation Care Coordinator will talk with you frequently to get your input and to update you on team discussions.  Team conferences with you and your family in attendance may also be held.  Expected length of stay: 10-12 days Overall anticipated outcome: Independent with assistive device   Depending on your progress and recovery, your program may change. Your Inpatient Rehabilitation Care Coordinator will coordinate services and will keep you informed of any changes. Your Inpatient Rehabilitation Care Coordinator's name and contact numbers are listed  below.  The following services may also be recommended but are not provided by the Inpatient Rehabilitation Center:  Driving Evaluations Home Health Rehabiltiation Services Outpatient Rehabilitation Services Vocational Rehabilitation   Arrangements will be made to provide these services after discharge if needed.  Arrangements include referral to agencies that provide these services.  Your insurance has been verified to be: MEDICARE / MEDICARE PART A AND B   Your primary  doctor is:  Hope Merle, MD  Pertinent information will be shared with your doctor and your insurance company.  Inpatient Rehabilitation Care Coordinator:  Di'Asia Loreli SIERRAS [phone] or ELIGAH BRINKS  Information discussed with and copy given to patient by: Waverly Loreli, 02/07/2024, 3:07 PM

## 2024-02-07 NOTE — Progress Notes (Signed)
 PROGRESS NOTE   Subjective/Complaints: C/o headache responsive to tylenol  Slept well last night Labs stable Staples c/d/I  ROS: +headache   Objective:   No results found. Recent Labs    02/05/24 1021 02/07/24 0447  WBC 12.5* 12.1*  HGB 14.0 14.2  HCT 39.3 39.2  PLT 206 199   Recent Labs    02/05/24 1021 02/07/24 0447  NA 136 136  K 4.1 4.1  CL 102 104  CO2 22 23  GLUCOSE 123* 129*  BUN 24* 26*  CREATININE 0.98 1.05  CALCIUM  8.9 8.7*    Intake/Output Summary (Last 24 hours) at 02/07/2024 1036 Last data filed at 02/07/2024 0910 Gross per 24 hour  Intake 740 ml  Output 75 ml  Net 665 ml        Physical Exam: Vital Signs Blood pressure 127/81, pulse (!) 58, temperature 98.1 F (36.7 C), temperature source Oral, resp. rate 18, height 5' 7 (1.702 m), weight 91.2 kg, SpO2 95%. Gen: no distress, normal appearing HEENT: oral mucosa pink and moist, NCAT Cardio: Bradycardia Chest: normal effort, normal rate of breathing Abd: soft, non-distended Ext: no edema Psych: pleasant, normal affect Skin: intact Motor strength is 5/5 bilateral deltoid bicep tricep grip as well as hip flexor knee extensor ankle dorsiflexor Sit to stand is with supervision Sensation intact light touch bilateral upper and lower limbs Cerebellar normal finger-nose-finger testing bilateral upper limbs Finger to thumb opposition intact bilateral upper limbs There is no evidence of dysdiadochokinesis with rapid alternating supination pronation of bilateral upper extremities General no acute distress Mood and affect without lability or agitation Lungs are clear to auscultation Abdomen positive bowel sounds soft nontender to palpation Extremities without clubbing cyanosis or edema Musculoskeletal: Normal shoulder elbow wrist hand hip knee and ankle range of motion Negative straight leg raising bilaterally   Assessment/Plan: 1.  Functional deficits which require 3+ hours per day of interdisciplinary therapy in a comprehensive inpatient rehab setting. Physiatrist is providing close team supervision and 24 hour management of active medical problems listed below. Physiatrist and rehab team continue to assess barriers to discharge/monitor patient progress toward functional and medical goals  Care Tool:  Bathing              Bathing assist       Upper Body Dressing/Undressing Upper body dressing        Upper body assist      Lower Body Dressing/Undressing Lower body dressing            Lower body assist       Toileting Toileting    Toileting assist Assist for toileting: Independent     Transfers Chair/bed transfer  Transfers assist     Chair/bed transfer assist level: Moderate Assistance - Patient 50 - 74%     Locomotion Ambulation   Ambulation assist              Walk 10 feet activity   Assist           Walk 50 feet activity   Assist           Walk 150 feet activity   Assist  Walk 10 feet on uneven surface  activity   Assist           Wheelchair     Assist               Wheelchair 50 feet with 2 turns activity    Assist            Wheelchair 150 feet activity     Assist          Blood pressure 127/81, pulse (!) 58, temperature 98.1 F (36.7 C), temperature source Oral, resp. rate 18, height 5' 7 (1.702 m), weight 91.2 kg, SpO2 95%.  Medical Problem List and Plan: 1. 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  Status post left frontal temporal parietal craniotomy evacuation of SDH 02/01/2024 per Dr. Alm Molt.  JP drain removed 02/03/2024.  Decadron  taper as indicated             -patient may  shower with shower cap             -ELOS/Goals: 10-12d Supervision   Grounds pass ordered  2.  Antithrombotics: -DVT/anticoagulation:  Mechanical: Antiembolism stockings, thigh (TED hose) Bilateral lower  extremities             -antiplatelet therapy: N/A 3. Pain Management: Hydrocodone  5-325 mg 1 tablet every 4 hours as needed 4. Mood/Behavior/Sleep: Klonopin  0.5 mg nightly             -antipsychotic agents: N/A 5. Neuropsych/cognition: This patient is not capable of making decisions on his own behalf. 6. Skin/Wound Care: Routine skin checks 7. Fluids/Electrolytes/Nutrition: Routine and analysis with follow-up chemistries 8.  Seizure prophylaxis.  Keppra  500 mg twice daily- continue until 9/11  9.  Hyperlipidemia.  Continue Lipitor  10.  Hypertension: continue Cozaar  50 mg daily.  Monitor with increased mobility  11.  Left detached retina.  Recent eye surgery.  Follow-up outpatient ophthalmology Dr. Rankin Burke [phone]  12.  Class I obesity.  BMI 31.32.  Dietary follow-up  13.  Constipation.  Senokot 1 tablet twice daily, Colace 100 mg twice daily, asked nursing to bring him prune juice  14.  BPH: continue proscar , messaged nursing regarding PVRs  15. Bradycardia: medications reviewed and he is not on any rate lowering agents  LOS: 1 days A FACE TO FACE EVALUATION WAS PERFORMED  Sven P Nalina Yeatman 02/07/2024, 10:36 AM

## 2024-02-07 NOTE — Progress Notes (Signed)
 Inpatient Rehabilitation Admission Medication Review by a Pharmacist  A complete drug regimen review was completed for this patient to identify any potential clinically significant medication issues.  High Risk Drug Classes Is patient taking? Indication by Medication  Antipsychotic No   Anticoagulant No   Antibiotic No   Opioid Yes Vicodin - pain management  Antiplatelet No   Hypoglycemics/insulin  No   Vasoactive Medication Yes Losartan  - HTN  Chemotherapy No   Other Yes Acetaminophen  - pain management Atorvastatin  - HLD Clonazepam  - mood/ behavior/sleep Dexamethasone - anti-inflammatory s/p craniotomy and evacuation of subdural hematoma   Finasteride  -  BPH Keppra -seizure prophylaxis Loratidine - allergies Ondansetron  - nausea and vomiting Protonix - reflux Senna, docusate, bisacodyl - constipation       Type of Medication Issue Identified Description of Issue Recommendation(s)  Drug Interaction(s) (clinically significant)     Duplicate Therapy     Allergy     No Medication Administration End Date     Incorrect Dose     Additional Drug Therapy Needed     Significant med changes from prior encounter (inform family/care partners about these prior to discharge). New medications  losartan , dexamethasone , protonix , keppra  Communicate medication changes to patient /family/ caregiver prior to discharge.   Other       Clinically significant medication issues were identified that warrant physician communication and completion of prescribed/recommended actions by midnight of the next day:  No  Name of provider notified for urgent issues identified:   Provider Method of Notification:    Pharmacist comments:   Time spent performing this drug regimen review (minutes):  20  Levorn Gaskins, RPh Clinical Pharmacist 02/07/2024 8:11 AM

## 2024-02-07 NOTE — Discharge Instructions (Addendum)
 Inpatient Rehab Discharge Instructions  Edwin Dickerson Discharge date and time: No discharge date for patient encounter.   Activities/Precautions/ Functional Status: Activity: activity as tolerated Diet: Regular Wound Care: Routine skin checks Functional status:  ___ No restrictions     ___ Walk up steps independently ___ 24/7 supervision/assistance   ___ Walk up steps with assistance ___ Intermittent supervision/assistance  ___ Bathe/dress independently ___ Walk with walker     _x__ Bathe/dress with assistance ___ Walk Independently    ___ Shower independently ___ Walk with assistance    ___ Shower with assistance ___ No alcohol     ___ Return to work/school ________  Special Instructions: No driving smoking or alcohol  COMMUNITY REFERRALS UPON DISCHARGE:    Outpatient: PT     OT                 Agency: [facility] Phone:[phone]                My questions have been answered and I understand these instructions. I will adhere to these goals and the provided educational materials after my discharge from the hospital.  Patient/Caregiver Signature _______________________________ Date __________  Clinician Signature _______________________________________ Date __________  Please bring this form and your medication list with you to all your follow-up doctor's appointments.

## 2024-02-07 NOTE — Progress Notes (Incomplete)
 Reevaluation for Occupational Therapy Services    Patient Details  Name: Edwin Dickerson MRN: [id number] Date of Birth: Oct 30, 1950  Today's Date: 02/07/2024 OT Individual Time: 1300-1410 OT Individual Time Calculation (min): 70 min     OT Diagnosis: {diagnoses:3041644}  Pt did not discharge as planned on ***. Pt has been assessed and will continue with therapy services. Pt current dx is Principal Problem:   Traumatic subdural hematoma (HCC) .  Patient will benefit from skilled intervention to {benefit of skilled intervention:3041641} prior to discharge {discharge:3041642}.  Anticipate patient will require {supervision/assistance:22779} and {follow le:6958356}.  OT - End of Session Activity Tolerance: Tolerates 30+ min activity with multiple rests Endurance Deficit: Yes OT Assessment Rehab Potential (ACUTE ONLY): Good OT Barriers to Discharge: None OT Patient demonstrates impairments in the following area(s): Balance;Sensory;Edema;Cognition;Endurance;Motor;Perception;Safety;Vision OT Basic ADL's Functional Problem(s): Bathing;Dressing;Toileting;Grooming OT Advanced ADL's Functional Problem(s): None OT Transfers Functional Problem(s): Toilet;Tub/Shower OT Additional Impairment(s): None OT Plan OT Intensity: Minimum of 1-2 x/day, 45 to 90 minutes OT Frequency: 5 out of 7 days OT Duration/Estimated Length of Stay: ~10 days OT Treatment/Interventions: Balance/vestibular training;Disease mangement/prevention;Neuromuscular re-education;Self Care/advanced ADL retraining;Therapeutic Exercise;Cognitive remediation/compensation;DME/adaptive equipment instruction;Pain management;Skin care/wound managment;UE/LE Strength taining/ROM;Community reintegration;Patient/family education;Splinting/orthotics;UE/LE Coordination activities;Discharge planning;Functional mobility training;Psychosocial support;Therapeutic Activities;Visual/perceptual remediation/compensation OT Self Feeding Anticipated  Outcome(s): n/a OT Basic Self-Care Anticipated Outcome(s): mod  i OT Toileting Anticipated Outcome(s): mod I OT Bathroom Transfers Anticipated Outcome(s): mod I OT Recommendation Recommendations for Other Services: Neuropsych consult Patient destination: Home Follow Up Recommendations: Outpatient OT Equipment Recommended: To be determined   Cognition/ BIMS Cognition Overall Cognitive Status: Impaired/Different from baseline Arousal/Alertness: Awake/alert Orientation Level: Person;Place;Situation Person: Oriented Place: Oriented Situation: Oriented Memory: Impaired Memory Impairment: Decreased short term memory Attention: Selective Selective Attention: Appears intact Awareness: Impaired Awareness Impairment: Emergent impairment;Anticipatory impairment Problem Solving: Impaired Problem Solving Impairment: Functional complex Safety/Judgment: Impaired [employer] Scales of Cognitive Functioning: Automatic, Appropriate: Minimal Assistance for Daily Living Skills Brief Interview for Mental Status (BIMS) Repetition of Three Words (First Attempt): 3 Temporal Orientation: Year: Correct Temporal Orientation: Month: Accurate within 5 days Temporal Orientation: Day: Correct Recall: Sock: No, could not recall Recall: Blue: No, could not recall Recall: Bed: No, could not recall BIMS Summary Score: 9  Care Tool Care Tool Self Care Eating   Eating Assist Level: Independent    Oral Care    Oral Care Assist Level: Contact Guard/Toucning assist (in standing)    Bathing   Body parts bathed by patient: Right arm;Left arm;Chest;Abdomen;Front perineal area;Buttocks;Right upper leg;Left upper leg;Right lower leg;Left lower leg;Face     Assist Level: Contact Guard/Touching assist    Upper Body Dressing(including orthotics)   What is the patient wearing?: Pull over shirt   Assist Level: Set up assist    Lower Body Dressing (excluding footwear)   What is the patient  wearing?: Underwear/pull up;Pants Assist for lower body dressing: Contact Guard/Touching assist    Putting on/Taking off footwear   What is the patient wearing?: Socks;Shoes Assist for footwear: Set up assist       Care Tool Toileting Toileting activity   Assist for toileting: Contact Guard/Touching assist     Care Tool Bed Mobility Roll left and right activity        Sit to lying activity        Lying to sitting on side of bed activity         Care Tool Transfers Sit to stand transfer   Sit to stand assist level: Minimal Assistance - Patient >  75%    Chair/bed transfer   Chair/bed transfer assist level: Minimal Assistance - Patient > 75%     Toilet transfer   Assist Level: Minimal Assistance - Patient > 75%     Care Tool Cognition  Expression of Ideas and Wants Expression of Ideas and Wants: 3. Some difficulty - exhibits some difficulty with expressing needs and ideas (e.g, some words or finishing thoughts) or speech is not clear  Understanding Verbal and Non-Verbal Content Understanding Verbal and Non-Verbal Content: 4. Understands (complex and basic) - clear comprehension without cues or repetitions   Memory/Recall Ability     Skilled Intervention:   Pain:   Refer to Care Plan for Long Term Goals  The above assessment, treatment plan, treatment alternatives and goals were discussed and mutually agreed upon: {Assessment/Treatment Plan Discussed/Agreed:3049017}  [name] [facility] 02/07/2024, 7:21 PM

## 2024-02-07 NOTE — Plan of Care (Signed)
  Problem: RH Swallowing Goal: LTG Patient will consume least restrictive diet using compensatory strategies with assistance (SLP) Description: LTG:  Patient will consume least restrictive diet using compensatory strategies with assistance (SLP) Flowsheets (Taken 02/07/2024 1118) LTG: Pt Patient will consume least restrictive diet using compensatory strategies with assistance of (SLP): Supervision   Problem: RH Expression Communication Goal: LTG Patient will verbally express basic/complex needs(SLP) Description: LTG:  Patient will verbally express basic/complex needs, wants or ideas with cues  (SLP) Flowsheets (Taken 02/07/2024 1118) LTG: Patient will verbally express basic/complex needs, wants or ideas (SLP): Supervision   Problem: RH Problem Solving Goal: LTG Patient will demonstrate problem solving for (SLP) Description: LTG:  Patient will demonstrate problem solving for basic/complex daily situations with cues  (SLP) Flowsheets (Taken 02/07/2024 1118) LTG: Patient will demonstrate problem solving for (SLP): Basic daily situations LTG Patient will demonstrate problem solving for: Supervision   Problem: RH Memory Goal: LTG Patient will use memory compensatory aids to (SLP) Description: LTG:  Patient will use memory compensatory aids to recall biographical/new, daily complex information with cues (SLP) Flowsheets (Taken 02/07/2024 1118) LTG: Patient will use memory compensatory aids to (SLP): Supervision   Problem: RH Awareness Goal: LTG: Patient will demonstrate awareness during functional activites type of (SLP) Description: LTG: Patient will demonstrate awareness during functional activites type of (SLP) Flowsheets (Taken 02/07/2024 1118) Patient will demonstrate during cognitive/linguistic activities awareness type of: Emergent LTG: Patient will demonstrate awareness during cognitive/linguistic activities with assistance of (SLP): Supervision

## 2024-02-07 NOTE — Progress Notes (Signed)
 Inpatient Rehabilitation Care Coordinator Assessment and Plan Patient Details  Name: Edwin Dickerson MRN: [id number] Date of Birth: 06-28-1950  Today's Date: 02/07/2024  Hospital Problems: Principal Problem:   Traumatic subdural hematoma [facility])  Past Medical History:  Past Medical History:  Diagnosis Date   High cholesterol    TIA (transient ischemic attack)    Past Surgical History:  Past Surgical History:  Procedure Laterality Date   CRANIOTOMY Left 02/01/2024   Procedure: CRANIOTOMY HEMATOMA EVACUATION SUBDURAL;  Surgeon: Joshua Alm Hamilton, MD;  Location: [facility] OR;  Service: Neurosurgery;  Laterality: Left;   PARS PLANA REPAIR OF RETINAL DEATACHMENT     Social History:  reports that he has never smoked. He has never used smokeless tobacco. He reports that he does not drink alcohol and does not use drugs.  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, historic buildings Children: Son and daughter Other Supports: Grandchildren Anticipated Caregiver: Spouse, Edwin Dickerson Caregiver Availability: 24/7 Family Dynamics: Great family suopport  Social History Preferred language: English Religion:  Education: High school Health Literacy - How often do you need to have someone help you when you read instructions, pamphlets, or other written material from your doctor or pharmacy?: Sometimes Writes: Yes Employment Status: Employed 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   Abuse/Neglect Abuse/Neglect Assessment Can Be Completed: Yes Physical Abuse: Denies Verbal Abuse: Denies Sexual Abuse: Denies Exploitation of patient/patient's resources: Denies Self-Neglect: Denies  Patient response to: Social Isolation - How often do you feel lonely or isolated from those around you?: Never  Emotional Status Pt's affect, behavior and adjustment  status: Patient is adjusting well Recent Psychosocial Issues: None Psychiatric History: None Substance Abuse History: None  Patient / Family Perceptions, Expectations & Goals Pt/Family understanding of illness & functional Dickerson: Patient/family understanding of Dickerson Premorbid pt/family roles/activities: Farming outside, spouse, parent, grandparent Anticipated changes in roles/activities/participation: None anticipated Pt/family expectations/goals: Patient has goals of healing and returning to work  [employer]: None Premorbid Home Care/DME Agencies: None Transportation available at discharge: Yes, wife Edwin Is the patient able to respond to transportation needs?: Yes In the past 12 months, has lack of transportation kept you from medical appointments or from getting medications?: No In the past 12 months, has lack of transportation kept you from meetings, work, or from getting things needed for daily living?: No  Discharge Planning Living Arrangements: Spouse/significant other Support Systems: Spouse/significant other, Children Type of Residence: Private residence Insurance Resources: Media planner (specify) (MEDICARE / MEDICARE PART A AND B and AETNA) Financial Resources: Employment Financial Screen Referred: No Living Expenses: Own Money Management: Patient, Spouse Does the patient have any problems obtaining your medications?: No Home Management: Patient/wife Patient/Family Preliminary Plans: Plans to return home with wife Care Coordinator Anticipated Follow Up Needs: HH/OP Expected length of stay: 10-12 days  Clinical Impression CSW met with patient/family to introduce herself and complete initial assessment. Patient is AxOx4 and able to make all needs known. Patient is a 73 y/o male who presents to [facility] after experiencing a fall from his bed where he also hit his head on the side table. Patient lives in a two-level home with his  wife. The main floor has bedrooms as well as a bathroom and a kitchen that will be easily accessible for the family. His son lives across the road from him and is able to provide support along with  his daughter. IFZ:pwrolizd a rollator that his wife has from knee surgery. My [place] has a goal of progressing to prior independence.Transportation will be provided by his wife, Edwin upon discharge.There were no further needs or concerns at present. CSW will continue to follow for therapy recommendations.    [place]  Tiny Rietz 02/07/2024, 3:00 PM

## 2024-02-07 NOTE — Discharge Summary (Signed)
 Physician Discharge Summary  Patient ID: Edwin Dickerson MRN: [id number] DOB/AGE: 75-14-1952 75 y.o.  Admit date: 02/06/2024 Discharge date: 02/14/2024  Discharge Diagnoses:  Principal Problem:   Traumatic subdural hematoma (HCC) Seizure prophylaxis Hyperlipidemia Hypertension Left detached retina Class I obesity Constipation BPH  Discharged Condition: Stable  Significant Diagnostic Studies: CT HEAD WO CONTRAST ( ) Result Date: 02/03/2024 CLINICAL DATA:  Subdural hematoma EXAM: CT HEAD WITHOUT CONTRAST TECHNIQUE: Contiguous axial images were obtained from the base of the skull through the vertex without intravenous contrast. RADIATION DOSE REDUCTION: This exam was performed according to the departmental dose-optimization program which includes automated exposure control, adjustment of the mA and/or kV according to patient size and/or use of iterative reconstruction technique. COMPARISON:  02/02/2024 FINDINGS: There has been a left frontal burr-hole. There is left cerebral convexity subdural hematoma/air with a maximum thickness of approximately 10 mm. There is a 4 mm left-to-right midline shift and slight depression of the left lateral ventricle. There is a small amount of subarachnoid hemorrhage in the sylvian fissure and over the frontal convexity which is unchanged. IMPRESSION: Postoperative changes from evacuation of left subdural hematoma. The subdural hematoma overall appears a little smaller compared with yesterday. The 4 mm midline shift is unchanged. No change in subarachnoid hemorrhage. Electronically Signed   By: Nancyann Burns M.D.   On: 02/03/2024 12:22   CT HEAD WO CONTRAST Result Date: 02/02/2024 EXAM: CT HEAD WITHOUT CONTRAST 02/02/2024 06:21:21 AM TECHNIQUE: CT of the head was performed without the administration of intravenous contrast. Automated exposure control, iterative reconstruction, and/or weight based adjustment of the mA/kV was utilized to reduce the radiation dose to as  low as reasonably achievable. COMPARISON: CT of the head dated 02/01/2024. CLINICAL HISTORY: Subdural hematoma. FINDINGS: BRAIN AND VENTRICLES: Since the previous study, the patient has undergone left frontoparietal craniotomy for evacuation of a left subdural hematoma. A drainage catheter has been placed in the interim. There is new subarachnoid and subdural hemorrhage present. Overall, the subdural hematoma is slightly decreased in maximal thickness on the coronal images from 22 mm to 20 mm. There has been mild interval improvement of shift of midline structures to the right, now measuring 4 mm. Previously the midline shift measured up to 8 mm. ORBITS: No acute abnormality. SINUSES: No acute abnormality. SOFT TISSUES AND SKULL: No acute soft tissue abnormality. Postoperative changes of left frontoparietal craniotomy. IMPRESSION: 1. Status post left frontoparietal craniotomy for evacuation of a left subdural hematoma with placement of a drainage catheter. 2. New subarachnoid and subdural hemorrhage. 3. Slight decrease in maximal thickness of the subdural hematoma from 22 mm to 20 mm. 4. Mild interval improvement of shift of midline structures to the right, now measuring 4 mm (previously 8 mm). Electronically signed by: Evalene Coho MD 02/02/2024 06:50 AM EDT RP Workstation: [id number]   CT HEAD WO CONTRAST Addendum Date: 02/01/2024 ADDENDUM REPORT: 02/01/2024 17:18 ADDENDUM: Impression #1 called by telephone on 02/01/2024 at 5:17 pm to provider Dr. Neysa, who verbally acknowledged these results. Electronically Signed   By: Rockey Childs D.O.   On: 02/01/2024 17:18   Result Date: 02/01/2024 CLINICAL DATA:  Provided history: Neuro deficit, acute, stroke suspected. Right-sided weakness. Slurred speech. Right-sided facial droop. EXAM: CT HEAD WITHOUT CONTRAST TECHNIQUE: Contiguous axial images were obtained from the base of the skull through the vertex without intravenous contrast. RADIATION DOSE REDUCTION: This  exam was performed according to the departmental dose-optimization program which includes automated exposure control, adjustment of the mA and/or kV according to  patient size and/or use of iterative reconstruction technique. COMPARISON:  Brain MRI 10/23/2016. FINDINGS: Brain: Mild generalized cerebral atrophy. Mixed density subdural hematoma centered along the left frontal convexity (measuring up to 2.3 cm in thickness). The subdural hematoma contains low-density, intermediate density and hyperdense components. Mass effect upon the underlying brain parenchyma with 8 mm rightward midline shift. The basal cisterns remain patent. No demarcated cortical infarct. No evidence of an intracranial mass. Vascular: No hyperdense vessel.  Atherosclerotic calcifications. Skull: No calvarial fracture or aggressive osseous lesion. Sinuses/Orbits: No mass or acute finding within the imaged orbits. No significant paranasal sinus disease at the imaged levels. Attempts are being made to reach the ordering provider at this time. IMPRESSION: 1. Mixed density subdural hematoma centered along the left frontal convexity (measuring up to 2.3 mm in thickness). Mass effect upon the underlying brain parenchyma with 8 mm rightward midline shift. 2. Mild cerebral atrophy. Electronically Signed: By: Rockey Childs D.O. On: 02/01/2024 17:13    Labs:  Basic Metabolic Panel: Recent Labs  Lab 02/11/24 1848 02/13/24 0610  NA 130* 134*  K 4.8 4.7  CL 99 104  CO2 21* 20*  GLUCOSE 146* 132*  BUN 35* 29*  CREATININE 1.29* 1.02  CALCIUM  8.6* 8.7*    CBC: Recent Labs  Lab 02/12/24 1035  WBC 17.6*  NEUTROABS 14.5*  HGB 15.0  HCT 43.0  MCV 87.4  PLT 296    CBG: Recent Labs  Lab 02/07/24 0551 02/07/24 1124 02/07/24 1636 02/07/24 2108 02/08/24 0626  GLUCAP 114* 111* 125* 133* 142*    Brief HPI:   Edwin Dickerson is a 73 y.o. right-handed male with history significant for hyperlipidemia, TIA, BPH, class I obesity with BMI  31.32, left detached retina and recent eye surgery per Dr. Rankin Nightingale.  Per chart review patient lives with spouse.  Independent and active prior to admission.  Presented 02/01/2024 with progressive altered mental status word finding difficulties and right side weakness.  By report patient had fallen from his bed and struck his head approximately 4 weeks ago with persistent headaches.  Cranial CT scan showed mixed density subdural hematoma centered along the left frontal convexity (measuring up to 2.3 mm in thickness).  Mass effect upon the underlying brain parenchyma with 8 mm rightward midline shift.  Mild cerebral atrophy.  Underwent left frontal temporal parietal craniotomy for evacuation of subdural hematoma 02/01/2024 per Dr. Alm Molt.  Postoperative cranial CT scan 02/02/2024 showed small new subarachnoid and subdural hemorrhage slight decrease in maximal thickness from subdural hematoma from 22 mm to 20 mm.  Mild interval improvement of shift of midline structures to the right now measuring 4 mm (previously 8 mm) and advise continued monitoring per neurosurgery.  Decadron  protocol as indicated.  Keppra  added for seizure prophylaxis.  A follow-up cranial CT scan repeated 02/03/2024 showed postoperative changes from evacuation of left SDH.  The SDH overall appeared a bit smaller compared from prior scan of 02/02/2024.  The midline shift was unchanged at 4 mm.  No change in subarachnoid hemorrhage.  JP drain removed 02/03/2024.  Tolerating a regular consistency diet.  Therapy evaluations completed due to patient's decreased functional mobility was admitted for a comprehensive rehab program.   Hospital Course: Zeth Quijas was admitted to rehab 02/06/2024 for inpatient therapies to consist of PT, ST and OT at least three hours five days a week. Past admission physiatrist, therapy team and rehab RN have worked together to provide customized collaborative inpatient rehab.  Pertaining to patient's  traumatic SDH.   Status post left frontal temporal parietal craniotomy evacuation of subdural hematoma 02/01/2024.  Follow-up neurosurgery Dr. Alm Molt.  JP drain removed 02/03/2024.  Decadron  taper as indicated.  SCDs for DVT prophylaxis no signs of DVT.  Pain management with the use of limited hydrocodone  as needed.  Mood stabilization was using Klonopin  as prior to admission to help aid in sleep.  He continued on Keppra  for seizure prophylaxis 500 mg twice daily no seizure activity.  Lipitor ongoing for hyperlipidemia.  Blood pressure controlled on Cozaar  50 mg daily and monitored with increased mobility.  Left detached retina recent eye surgery follow-up outpatient ophthalmology Dr. Rankin Nightingale.  Class I obesity BMI 31.32 dietary follow-up.  History of BPH as Proscar  was ongoing voiding without difficulty.  Bouts of constipation resolved with laxative assistance.   Blood pressures were monitored on TID basis and remained controlled and monitored    Rehab course: During patient's stay in rehab weekly team conferences were held to monitor patient's progress, set goals and discuss barriers to discharge. At admission, patient required minimal assist 220 feet.  Minimal assist step pivot transfers  He/She  has had improvement in activity tolerance, balance, postural control as well as ability to compensate for deficits. He/She has had improvement in functional use RUE/LUE  and RLE/LLE as well as improvement in awareness.  Requires minimal assist with mobility secondary to decreased coordination and motor planning as well as recent left eye surgery.  Sit to lying assist level contact-guard.  Roll left to right assist supervision.  Ambulated 200 feet minimal assist without assistive device.  Patient navigate stairs with minimal assist/contact-guard due to some decreased vision in the left eye and coordination.  Contact-guard for all bed mobility.  Stands at the sink for grooming.  Gathers belongings for ADLs.  Patient  challenged in a paragraph retention task and given a 10-minute distracted delay, patient recalled information 100% accuracy.  Answered biographical and personal questions with minimal difficulty.  Full family teaching completed plan discharge to home       Disposition:  Discharge disposition: 01-Home or Self Care        Diet: Regular  Special Instructions: No driving smoking or alcohol  Medications at discharge. 1.  Tylenol  as needed 2.  Lipitor 40 mg p.o. daily 3.  Klonopin  0.5 mg p.o. nightly 4.  Colace 100 mg  daily 5.  Proscar  5 mg p.o. daily 6.  B complex with vitamin C 1 tablet daily 7.  Vitamin D  1000 units daily 8.  Zyrtec 10 mg daily as needed 9.  Cozaar  25 mg p.o. daily 10.  Magnesium  gluconate 250 mg daily 11.  Scopolamine  patch 1 mg every 72 hours   30-35 minutes were spent completing discharge summary and discharge planning  Discharge Instructions     Ambulatory referral to Physical Medicine Rehab   Complete by: As directed    Moderate complexity follow-up 1 to 2 weeks traumatic SDH        Follow-up Information     Raulkar, Sven SQUIBB, MD Follow up.   Specialty: Physical Medicine and Rehabilitation Why: Office to call for appointment Contact information: [address] [place] [address] [phone]         Molt Alm Hamilton, MD Follow up.   Specialty: Neurosurgery Why: Call for appointment Contact information: [address] [place] [address] [phone]         Raj Rankin SAUNDERS, MD Follow up.  Specialty: Ophthalmology Why: Call for appointment Contact information: [address] [place] [address] [phone]         Hope Merle, MD Follow up.   Specialty: Internal Medicine Why: Call for appointment Contact information: [address] [place] [address] [phone]                 Signed: Toribio PARAS Akeya Ryther 02/14/2024, 4:50 AM

## 2024-02-07 NOTE — Plan of Care (Signed)
  Problem: RH Balance Goal: LTG Patient will maintain dynamic standing with ADLs (OT) Description: LTG:  Patient will maintain dynamic standing balance with assist during activities of daily living (OT)  Flowsheets (Taken 02/07/2024 1902) LTG: Pt will maintain dynamic standing balance during ADLs with: Independent with assistive device   Problem: Sit to Stand Goal: LTG:  Patient will perform sit to stand in prep for activites of daily living with assistance level (OT) Description: LTG:  Patient will perform sit to stand in prep for activites of daily living with assistance level (OT) Flowsheets (Taken 02/07/2024 1902) LTG: PT will perform sit to stand in prep for activites of daily living with assistance level: Independent with assistive device   Problem: RH Bathing Goal: LTG Patient will bathe all body parts with assist levels (OT) Description: LTG: Patient will bathe all body parts with assist levels (OT) Flowsheets (Taken 02/07/2024 1902) LTG: Pt will perform bathing with assistance level/cueing: Independent with assistive device    Problem: RH Dressing Goal: LTG Patient will perform upper body dressing (OT) Description: LTG Patient will perform upper body dressing with assist, with/without cues (OT). Flowsheets (Taken 02/07/2024 1902) LTG: Pt will perform upper body dressing with assistance level of: Independent with assistive device Goal: LTG Patient will perform lower body dressing w/assist (OT) Description: LTG: Patient will perform lower body dressing with assist, with/without cues in positioning using equipment (OT) Flowsheets (Taken 02/07/2024 1902) LTG: Pt will perform lower body dressing with assistance level of: Independent with assistive device   Problem: RH Toileting Goal: LTG Patient will perform toileting task (3/3 steps) with assistance level (OT) Description: LTG: Patient will perform toileting task (3/3 steps) with assistance level (OT)  Flowsheets (Taken 02/07/2024 1902) LTG:  Pt will perform toileting task (3/3 steps) with assistance level: Independent with assistive device   Problem: RH Vision Goal: RH LTG Vision Consulting civil engineer) Flowsheets (Taken 02/07/2024 1902) LTG: Vision Goals: Pt will visually attend to peripheral visual fields with functional ambuation mod I.   Problem: RH Simple Meal Prep Goal: LTG Patient will perform simple meal prep w/assist (OT) Description: LTG: Patient will perform simple meal prep with assistance, with/without cues (OT). Flowsheets (Taken 02/07/2024 1902) LTG: Pt will perform simple meal prep with assistance level of: Independent with assistive device   Problem: RH Toilet Transfers Goal: LTG Patient will perform toilet transfers w/assist (OT) Description: LTG: Patient will perform toilet transfers with assist, with/without cues using equipment (OT) Flowsheets (Taken 02/07/2024 1902) LTG: Pt will perform toilet transfers with assistance level of: Independent with assistive device   Problem: RH Tub/Shower Transfers Goal: LTG Patient will perform tub/shower transfers w/assist (OT) Description: LTG: Patient will perform tub/shower transfers with assist, with/without cues using equipment (OT) Flowsheets (Taken 02/07/2024 1902) LTG: Pt will perform tub/shower stall transfers with assistance level of: Independent with assistive device   Problem: RH Memory Goal: LTG Patient will demonstrate ability for day to day recall/carry over during activities of daily living with assistance level (OT) Description: LTG:  Patient will demonstrate ability for day to day recall/carry over during activities of daily living with assistance level (OT). Flowsheets (Taken 02/07/2024 1902) LTG:  Patient will demonstrate ability for day to day recall/carry over during activities of daily living with assistance level (OT): Modified Independent

## 2024-02-07 NOTE — Progress Notes (Signed)
 Inpatient Rehabilitation  Patient information reviewed and entered into eRehab system by [facility]. Karen Kays., CCC/SLP, PPS Coordinator.  Information including medical coding, functional ability and quality indicators will be reviewed and updated through discharge.

## 2024-02-07 NOTE — Evaluation (Signed)
 Occupational Therapy Assessment and Plan  Patient Details  Name: Edwin Dickerson MRN: [id number] Date of Birth: July 21, 1950  OT Diagnosis: cognitive deficits, disturbance of vision, and hemiplegia affecting dominant side Rehab Potential: Rehab Potential (ACUTE ONLY): Good ELOS: ~10 days   Today's Date: 02/07/2024 OT Individual Time: 1300-1410 OT Individual Time Calculation (min): 70 min     Hospital Problem: Principal Problem:   Traumatic subdural hematoma (HCC)   Past Medical History:  Past Medical History:  Diagnosis Date   High cholesterol    TIA (transient ischemic attack)    Past Surgical History:  Past Surgical History:  Procedure Laterality Date   CRANIOTOMY Left 02/01/2024   Procedure: CRANIOTOMY HEMATOMA EVACUATION SUBDURAL;  Surgeon: Joshua Alm Hamilton, MD;  Location: [facility] OR;  Service: Neurosurgery;  Laterality: Left;   PARS PLANA REPAIR OF RETINAL DEATACHMENT      Assessment & Plan Clinical Impression: Patient is a 73 y.o. year old male right-handed male with history of hyperlipidemia, TIA, BPH,, class I obesity BMI 31.32, left detached retina and recent eye surgery per Dr. Rankin Burke.  Per chart review patient lives with spouse.  Two-level home bed and bath main level.  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  He completed eighth grade however family reports he has difficulty reading and writing at baseline.  Presented 02/01/2024 with progressive altered mental status/word finding difficulties and right side weakness.  By report patient had fallen from his bed and struck his head approximately 4 weeks ago with persistent headaches.  Cranial CT scan showed mixed density subdural hematoma centered along the left frontal convexity (measuring up to 2.3 mm in thickness).  Mass effect upon the underlying brain parenchyma with 8 mm rightward midline shift.  Mild cerebral atrophy.  Underwent left frontotemporoparietal craniotomy for evacuation of subdural hematoma 02/01/2024 per Dr. Alm Joshua.  Postoperative cranial CT scan 02/02/2024 showed small new subarachnoid and subdural hemorrhage slight decrease in maximal thickness of subdural hematoma from 22 mm to 20 mm.  Mild interval improvement of shift of midline structures to the right now measuring 4 mm (previously 8 mm) and advised continued monitoring per neurosurgery.  Decadron  protocol as indicated.  Keppra  maintained for seizure prophylaxis.  A follow-up cranial CT scan repeated 02/03/2024 showing postoperative changes from evacuation of left SDH.  The SDH overall appeared a bit smaller compared with prior scan of 02/02/2024.  The 4 mm midline shift was unchanged.  No change in subarachnoid hemorrhage.  JP drain was removed 02/03/2024.  Tolerating a regular consistency diet.  Therapy evaluations completed due to patient decreased functional mobility was admitted for a comprehensive rehab program.   Patient transferred to CIR on 02/06/2024 .    Patient currently requires min with basic self-care skills and functional mobility secondary to muscle weakness, decreased cardiorespiratoy endurance, decreased visual perceptual skills, peripheral vision, decreased attention, decreased problem solving, decreased safety awareness, decreased memory, and demonstrates behaviors consistent with Rancho Level VII, and decreased standing balance, mild hemiplegia, and decreased balance strategies.  Prior to hospitalization, patient could complete ADL with independent .  Patient will benefit from skilled intervention to decrease level of assist with basic self-care skills and increase independence with basic self-care skills prior to discharge home with care partner.  Anticipate patient will require intermittent supervision and follow up home health.  OT - End of Session Activity Tolerance: Tolerates 30+ min activity with multiple rests Endurance Deficit: Yes OT Assessment Rehab Potential (ACUTE ONLY): Good OT Barriers to Discharge: None OT Patient  demonstrates impairments in the following area(s): Balance;Sensory;Edema;Cognition;Endurance;Motor;Perception;Safety;Vision OT Basic ADL's Functional Problem(s): Bathing;Dressing;Toileting;Grooming OT Advanced ADL's Functional Problem(s): None OT Transfers Functional Problem(s): Toilet;Tub/Shower OT Additional Impairment(s): None OT Plan OT Intensity: Minimum of 1-2 x/day, 45 to 90 minutes OT Frequency: 5 out of 7 days OT Duration/Estimated Length of Stay: ~10 days OT Treatment/Interventions: Balance/vestibular training;Disease mangement/prevention;Neuromuscular re-education;Self Care/advanced ADL retraining;Therapeutic Exercise;Cognitive remediation/compensation;DME/adaptive equipment instruction;Pain management;Skin care/wound managment;UE/LE Strength taining/ROM;Community reintegration;Patient/family education;Splinting/orthotics;UE/LE Coordination activities;Discharge planning;Functional mobility training;Psychosocial support;Therapeutic Activities;Visual/perceptual remediation/compensation OT Self Feeding Anticipated Outcome(s): n/a OT Basic Self-Care Anticipated Outcome(s): mod  i OT Toileting Anticipated Outcome(s): mod I OT Bathroom Transfers Anticipated Outcome(s): mod I OT Recommendation Recommendations for Other Services: Neuropsych consult Patient destination: Home Follow Up Recommendations: Outpatient OT Equipment Recommended: To be determined   OT Evaluation Precautions/Restrictions  Precautions Precautions: Fall Restrictions Weight Bearing Restrictions Per Provider Order: No General Chart Reviewed: Yes Family/Caregiver Present: No Vital Signs   Pain   Home Living/Prior Functioning Home Living Living Arrangements: Spouse/significant other Available Help at Discharge: Family, Available 24 hours/day Type of Home: House Home Access: Stairs to enter [employer] of Steps: 5 Entrance Stairs-Rails: Left Home Layout: Two level, Able to live on main level  with bedroom/bathroom Bathroom Shower/Tub: Engineer, manufacturing systems: Standard Additional Comments: 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, 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 Prior Function Level of Independence: Independent with gait, Independent with transfers  Able to Take Stairs?: Yes Driving: Yes Vision Baseline Vision/History:  (recent sx and with ocular bubble in eye) Ability to See in Adequate Light: 2 Moderately impaired Patient Visual Report: Blurring of vision Additional Comments: retinal detachement sx ~4 weeks ago; bubble in left inferior visual field Perception  Perception: Impaired Perception-Other Comments: impacted by vision Praxis Praxis: Impaired Praxis Impairment Details: Perseveration Cognition Cognition Overall Cognitive Status: Impaired/Different from baseline Arousal/Alertness: Awake/alert Orientation Level: Person;Place;Situation Person: Oriented Place: Oriented Situation: Oriented Memory: Impaired Memory Impairment: Decreased short term memory Decreased Short Term Memory: Verbal basic Attention: Selective Sustained Attention: Appears intact Selective Attention: Appears intact Awareness: Impaired Awareness Impairment: Emergent impairment;Anticipatory impairment Problem Solving: Impaired Problem Solving Impairment: Functional complex Safety/Judgment: Impaired Rancho Mirant Scales of Cognitive Functioning: Automatic, Appropriate: Minimal Assistance for Daily Living Skills Brief Interview for Mental Status (BIMS) Repetition of Three Words (First Attempt): 3 Temporal Orientation: Year: Correct Temporal Orientation: Month: Accurate within 5 days Temporal Orientation: Day: Correct Recall: Sock: No, could not recall Recall: Blue: No, could not recall Recall: Bed: No, could not recall BIMS Summary Score: 9 Sensation Sensation Light Touch: Appears Intact Proprioception: Appears Intact Coordination Gross Motor Movements are  Fluid and Coordinated: No Fine Motor Movements are Fluid and Coordinated: Yes Motor  Motor Motor: Hemiplegia Motor - Skilled Clinical Observations: slight r, UE > LE  Trunk/Postural Assessment  Thoracic Assessment Thoracic Assessment: Within Functional Limits Lumbar Assessment Lumbar Assessment: Within Functional Limits Postural Control Postural Control: Deficits on evaluation Protective Responses: slightly delayed.  Balance Balance Balance Assessed: Yes Static Sitting Balance Static Sitting - Balance Support: Feet supported Static Sitting - Level of Assistance: 5: Stand by assistance;6: Modified independent (Device/Increase time) Dynamic Sitting Balance Dynamic Sitting - Balance Support: Feet supported Dynamic Sitting - Level of Assistance: 5: Stand by assistance Static Standing Balance Static Standing - Balance Support: No upper extremity supported Static Standing - Level of Assistance: 4: Min assist Dynamic Standing Balance Dynamic Standing - Balance Support: No upper extremity supported Dynamic Standing - Level of Assistance: 4: Min assist Extremity/Trunk  Assessment RUE Assessment RUE Assessment: Within Functional Limits LUE Assessment LUE Assessment: Within Functional Limits  Care Tool Care Tool Self Care Eating   Eating Assist Level: Independent    Oral Care    Oral Care Assist Level: Contact Guard/Toucning assist (in standing)    Bathing   Body parts bathed by patient: Right arm;Left arm;Chest;Abdomen;Front perineal area;Buttocks;Right upper leg;Left upper leg;Right lower leg;Left lower leg;Face     Assist Level: Contact Guard/Touching assist    Upper Body Dressing(including orthotics)   What is the patient wearing?: Pull over shirt   Assist Level: Set up assist    Lower Body Dressing (excluding footwear)   What is the patient wearing?: Underwear/pull up;Pants Assist for lower body dressing: Contact Guard/Touching assist    Putting on/Taking off  footwear   What is the patient wearing?: Socks;Shoes Assist for footwear: Set up assist       Care Tool Toileting Toileting activity   Assist for toileting: Contact Guard/Touching assist     Care Tool Bed Mobility Roll left and right activity        Sit to lying activity        Lying to sitting on side of bed activity         Care Tool Transfers Sit to stand transfer   Sit to stand assist level: Minimal Assistance - Patient > 75%    Chair/bed transfer   Chair/bed transfer assist level: Minimal Assistance - Patient > 75%     Toilet transfer   Assist Level: Minimal Assistance - Patient > 75%     Care Tool Cognition  Expression of Ideas and Wants Expression of Ideas and Wants: 3. Some difficulty - exhibits some difficulty with expressing needs and ideas (e.g, some words or finishing thoughts) or speech is not clear  Understanding Verbal and Non-Verbal Content Understanding Verbal and Non-Verbal Content: 4. Understands (complex and basic) - clear comprehension without cues or repetitions   Memory/Recall Ability     Refer to Care Plan for Long Term Goals  SHORT TERM GOAL WEEK 1 OT Short Term Goal 1 (Week 1): STG=LTG mod I overall for ALDs  Recommendations for other services: None    Skilled Therapeutic Intervention 1:1 Ot eval initiated with OT purpose, role and goals. Pt received in the recliner. Pt with good recall of home and visual sx history however eveident of short term memory difficulty with new information. Self care retraining at shower level. Pt presents with mild inattention to right side through out session with functional mobility. PT able to use UE bilaterally with limited difficulty. Pt with reports of visual blurriness especially in left eye after recent sx and eyes appear not to be aligned. Pt does run into items on the right side with decr attention to it. Pt performed tooth brushing and shaving with electric razor at the sink in standing. Pt ambulated to  the gym and practiced stepping over different height hurdles- at 6,9 and 11 inches with min A - pt does position body based on visual disturbances. Pt also performed vestibular motor activity of stepping on different colored targets in the circle and then retrieving Squiz off a vertical surface at different heights and placing them in a bin behind him.  Returned to room and left sitting in the recliner. Mobility was all without a device with min guard to min A,    ADL ADL Grooming: Contact guard Where Assessed-Grooming: Standing at sink Upper Body Bathing: Supervision/safety Where Assessed-Upper Body Bathing: Shower  Lower Body Bathing: Contact guard Where Assessed-Lower Body Bathing: Shower Upper Body Dressing: Setup Where Assessed-Upper Body Dressing: Chair Lower Body Dressing: Contact guard Where Assessed-Lower Body Dressing: Chair Toileting: Contact guard Toilet Transfer: Minimal assistance Film/video editor: Administrator, arts Method: Designer, industrial/product: Shower seat with back Mobility  Bed Mobility Bed Mobility: Rolling Right;Rolling Left;Supine to Sit;Sit to Supine Rolling Right: Supervision/verbal cueing Rolling Left: Supervision/Verbal cueing Supine to Sit: Contact Guard/Touching assist Sit to Supine: Contact Guard/Touching assist Transfers Sit to Stand: Minimal Assistance - Patient > 75% Stand to Sit: Minimal Assistance - Patient > 75%   Discharge Criteria: Patient will be discharged from OT if patient refuses treatment 3 consecutive times without medical reason, if treatment goals not met, if there is a change in medical status, if patient makes no progress towards goals or if patient is discharged from hospital.  The above assessment, treatment plan, treatment alternatives and goals were discussed and mutually agreed upon: by patient  Claudene Delon Levy 02/07/2024, 7:07 PM

## 2024-02-07 NOTE — Evaluation (Signed)
 Physical Therapy Assessment and Plan  Patient Details  Name: Edwin Dickerson MRN: [id number] Date of Birth: 1950-10-29  PT Diagnosis: Abnormality of gait, Hemiplegia dominant, and Muscle weakness Rehab Potential: Excellent ELOS: 7-10 days   Today's Date: 02/07/2024 PT Individual Time: 0901-1000 PT Individual Time Calculation (min): 59 min    Hospital Problem: Principal Problem:   Traumatic subdural hematoma (HCC)   Past Medical History:  Past Medical History:  Diagnosis Date   High cholesterol    TIA (transient ischemic attack)    Past Surgical History:  Past Surgical History:  Procedure Laterality Date   CRANIOTOMY Left 02/01/2024   Procedure: CRANIOTOMY HEMATOMA EVACUATION SUBDURAL;  Surgeon: Joshua Alm Hamilton, MD;  Location: [facility] OR;  Service: Neurosurgery;  Laterality: Left;   PARS PLANA REPAIR OF RETINAL DEATACHMENT      Assessment & Plan Clinical Impression: Edwin Dickerson is a 73 year old right-handed male with history of hyperlipidemia, TIA, BPH,, class I obesity BMI 31.32, left detached retina and recent eye surgery per Dr. Rankin Burke. Per chart review patient lives with spouse. Two-level home bed and bath main level. 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 He completed eighth grade however family reports he has difficulty reading and writing at baseline. Presented 02/01/2024 with progressive altered mental status/word finding difficulties and right side weakness. By report patient had fallen from his bed and struck his head approximately 4 weeks ago with persistent headaches. Cranial CT scan showed mixed density subdural hematoma centered along the left frontal convexity (measuring up to 2.3 mm in thickness). Mass effect upon the underlying brain parenchyma with 8 mm rightward midline shift. Mild cerebral atrophy. Underwent left frontotemporoparietal craniotomy for evacuation of subdural hematoma 02/01/2024 per Dr. Alm Joshua. Postoperative cranial CT scan 02/02/2024 showed small new  subarachnoid and subdural hemorrhage slight decrease in maximal thickness of subdural hematoma from 22 mm to 20 mm. Mild interval improvement of shift of midline structures to the right now measuring 4 mm (previously 8 mm) and advised continued monitoring per neurosurgery. Decadron  protocol as indicated. Keppra  maintained for seizure prophylaxis. A follow-up cranial CT scan repeated 02/03/2024 showing postoperative changes from evacuation of left SDH. The SDH overall appeared a bit smaller compared with prior scan of 02/02/2024. The 4 mm midline shift was unchanged. No change in subarachnoid hemorrhage. JP drain was removed 02/03/2024. Tolerating a regular consistency diet. Therapy evaluations completed due to patient decreased functional mobility was admitted for a comprehensive rehab program.   Patient currently requires min with mobility secondary to decreased coordination and decreased motor planning and recent L eye surgery..  Prior to hospitalization, patient was independent  with mobility and lived with   in a House home.  Home access is 5Stairs to enter.  Patient will benefit from skilled PT intervention to maximize safe functional mobility, minimize fall risk, and decrease caregiver burden for planned discharge home with 24 hour supervision.  Anticipate patient will benefit from follow up HH at discharge.  PT - End of Session Activity Tolerance: Tolerates 30+ min activity with multiple rests Endurance Deficit: Yes PT Assessment Rehab Potential (ACUTE/IP ONLY): Excellent PT Barriers to Discharge: Home environment access/layout PT Patient demonstrates impairments in the following area(s): Balance;Safety;Endurance;Motor PT Transfers Functional Problem(s): Bed Mobility;Bed to Chair;Car;Furniture PT Locomotion Functional Problem(s): Ambulation;Stairs PT Plan PT Intensity: Minimum of 1-2 x/day ,45 to 90 minutes PT Frequency: 5 out of 7 days PT Duration Estimated Length of Stay: 7-10 days PT  Treatment/Interventions: Ambulation/gait training;Community reintegration;Neuromuscular re-education;Stair training;UE/LE Strength taining/ROM;UE/LE Coordination activities;Therapeutic Activities;Discharge  planning;Balance/vestibular training;Functional mobility training;Patient/family education;Therapeutic Exercise PT Transfers Anticipated Outcome(s): Mod I PT Locomotion Anticipated Outcome(s): supervision PT Recommendation Follow Up Recommendations: Home health PT Patient destination: Home Equipment Recommended: To be determined   PT Evaluation Precautions/Restrictions Precautions Precautions: Fall Restrictions Weight Bearing Restrictions Per Provider Order: No General Chart Reviewed: Yes Family/Caregiver Present: No Vital Signs  Pain Pain Assessment Pain Scale: 0-10 Pain Score: 3  Pain Type: Acute pain Pain Location: Head Pain Descriptors / Indicators: Aching Pain Onset: On-going Pain Interference Pain Interference Pain Effect on Sleep: 1. Rarely or not at all Pain Interference with Therapy Activities: 1. Rarely or not at all Pain Interference with Day-to-Day Activities: 1. 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 Prior Function Level of Independence: Independent with gait;Independent with transfers  Able to Take Stairs?: Yes Driving: Yes Vision/Perception  Vision - History Ability to See in Adequate Light: 1 Impaired (recent L eye surgery)  Cognition Overall Cognitive Status: Impaired/Different from baseline Arousal/Alertness: Awake/alert Orientation Level: Oriented X4 Year: 2025 Month:  September Day of Week: Correct Attention: Sustained Sustained Attention: Appears intact Memory: Impaired Memory Impairment: Decreased short term memory Decreased Short Term Memory: Verbal basic Awareness: Impaired Problem Solving: Impaired Problem Solving Impairment: Verbal basic Safety/Judgment: Impaired Sensation Sensation Light Touch: Appears Intact Coordination Gross Motor Movements are Fluid and Coordinated: No Motor  Motor Motor: Hemiplegia Motor - Skilled Clinical Observations: slight r, UE > LE   Trunk/Postural Assessment  Cervical Assessment Cervical Assessment: Within Functional Limits Thoracic Assessment Thoracic Assessment: Within Functional Limits Lumbar Assessment Lumbar Assessment: Within Functional Limits Postural Control Postural Control: Deficits on evaluation Protective Responses: slightly delayed.  Balance Balance Balance Assessed: Yes Static Sitting Balance Static Sitting - Balance Support: Feet supported Static Sitting - Level of Assistance: 5: Stand by assistance;6: Modified independent (Device/Increase time) Dynamic Sitting Balance Dynamic Sitting - Balance Support: Feet supported Dynamic Sitting - Level of Assistance: 5: Stand by assistance Static Standing Balance Static Standing - Balance Support: No upper extremity supported Static Standing - Level of Assistance: 4: Min assist Dynamic Standing Balance Dynamic Standing - Balance Support: No upper extremity supported Dynamic Standing - Level of Assistance: 4: Min assist Extremity Assessment      RLE Assessment RLE Assessment: Within Functional Limits LLE Assessment LLE Assessment: Within Functional Limits  Care Tool Care Tool Bed Mobility Roll left and right activity   Roll left and right assist level: Supervision/Verbal cueing    Sit to lying activity   Sit to lying assist level: Contact Guard/Touching assist    Lying to sitting on side of bed activity   Lying to sitting on side  of bed assist level: the ability to move from lying on the back to sitting on the side of the bed with no back support.: Contact Guard/Touching assist     Care Tool Transfers Sit to stand transfer   Sit to stand assist level: Minimal Assistance - Patient > 75%    Chair/bed transfer   Chair/bed transfer assist level: Minimal Assistance - Patient > 75%    Car transfer   Car transfer assist level: Minimal Assistance - Patient > 75%      Care  Tool Locomotion Ambulation   Assist level: Minimal Assistance - Patient > 75% Assistive device: No Device Max distance: 200  Walk 10 feet activity   Assist level: Minimal Assistance - Patient > 75% Assistive device: No Device   Walk 50 feet with 2 turns activity   Assist level: Minimal Assistance - Patient > 75% Assistive device: No Device  Walk 150 feet activity   Assist level: Minimal Assistance - Patient > 75% Assistive device: No Device  Walk 10 feet on uneven surfaces activity   Assist level: Minimal Assistance - Patient > 75% Assistive device: Other (comment) (no device)  Stairs   Assist level: Minimal Assistance - Patient > 75% Stairs assistive device: 1 hand rail Max number of stairs: 12  Walk up/down 1 step activity   Walk up/down 1 step (curb) assist level: Minimal Assistance - Patient > 75% Walk up/down 1 step or curb assistive device: 1 hand rail  Walk up/down 4 steps activity   Walk up/down 4 steps assist level: Minimal Assistance - Patient > 75% Walk up/down 4 steps assistive device: 1 hand rail  Walk up/down 12 steps activity   Walk up/down 12 steps assist level: Minimal Assistance - Patient > 75% Walk up/down 12 steps assistive device: 1 hand rail  Pick up small objects from floor   Pick up small object from the floor assist level: Contact Guard/Touching assist Pick up small object from the floor assistive device: reacher to avoid bending s/p eye surgery.  Wheelchair Is the patient using a wheelchair?: No           Wheel 50 feet with 2 turns activity      Wheel 150 feet activity        Refer to Care Plan for Long Term Goals  SHORT TERM GOAL WEEK 1 PT Short Term Goal 1 (Week 1): STG=LTG 2/2 ELOS  Recommendations for other services: None   Skilled Therapeutic Intervention Evaluation completed (see details above and below) with education on PT POC and goals and individual treatment initiated with focus on  endurance, balance, strengthening, gait and stairs.  Pt presents sitting EOB and agreeable to therapy.  Pt requires CGA for all bed mobility from flat bed w/o siderail use.  Pt transfers from sit to stand w/ min/CGA and cues for hand placement.  Pt amb w/o AD and min A > 150' per trial.  Pt does have slight weaving gait but w/o LOB.  Pt is aware.  Pt encouraged for increased gait speed and B arm swing to improve balance.  Pt negotiates 12 steps w/ L hand rail and min/CGA, reciprocal gait pattern self-selected.  Pt transfers into SUV height simulation w/ min/CGA.  Pt returned to room and remained sitting in recliner w/ LES elevated.  Chair alarm on and all needs in reach.       Mobility Bed Mobility Bed Mobility: Rolling Right;Rolling Left;Supine to Sit;Sit to Supine Rolling Right: Supervision/verbal cueing Rolling Left: Supervision/Verbal cueing Supine to Sit: Contact Guard/Touching assist Sit to Supine: Contact Guard/Touching assist Transfers Transfers: Sit to Stand;Stand to Sit;Stand Pivot Transfers Sit to Stand: Minimal Assistance - Patient > 75% Stand to Sit: Minimal Assistance - Patient > 75% Stand Pivot Transfers: Minimal Assistance - Patient > 75% Stand Pivot Transfer Details: Verbal cues for sequencing;Verbal cues for precautions/safety Transfer (Assistive device): None Locomotion  Gait Ambulation: Yes Gait Assistance: Contact Guard/Touching assist;Minimal Assistance - Patient > 75% Gait Distance (Feet): 200 Feet Assistive device: None Gait Assistance Details: Verbal cues  for  gait pattern Gait Assistance Details: cues for slight wandering gait Gait Gait: Yes Gait Pattern: Decreased dorsiflexion - right Gait velocity: decreased Stairs / Additional Locomotion Stairs: Yes Stairs Assistance: Minimal Assistance - Patient > 75% Stair Management Technique: One rail Left Number of Stairs: 12 Height of Stairs: 6 Ramp: Contact Guard/touching assist Curb: Minimal Assistance - Patient >75% Wheelchair Mobility Wheelchair Mobility: No   Discharge Criteria: Patient will be discharged from PT if patient refuses treatment 3 consecutive times without medical reason, if treatment goals not met, if there is a change in medical status, if patient makes no progress towards goals or if patient is discharged from hospital.  The above assessment, treatment plan, treatment alternatives and goals were discussed and mutually agreed upon: by patient  Reyes SHAUNNA Sierra 02/07/2024, 12:38 PM

## 2024-02-07 NOTE — Evaluation (Signed)
 Speech Language Pathology Assessment and Plan  Patient Details  Name: Edwin Dickerson MRN: [id number] Date of Birth: 01/24/1951  SLP Diagnosis: Cognitive Impairments;Speech and Language deficits  Rehab Potential: Excellent ELOS: 10-12 days    Today's Date: 02/07/2024 SLP Individual Time: 1001-1100 SLP Individual Time Calculation (min): 59 min   Hospital Problem: Principal Problem:   Traumatic subdural hematoma (HCC)  Past Medical History:  Past Medical History:  Diagnosis Date   High cholesterol    TIA (transient ischemic attack)    Past Surgical History:  Past Surgical History:  Procedure Laterality Date   CRANIOTOMY Left 02/01/2024   Procedure: CRANIOTOMY HEMATOMA EVACUATION SUBDURAL;  Surgeon: Joshua Alm Hamilton, MD;  Location: [facility] OR;  Service: Neurosurgery;  Laterality: Left;   PARS PLANA REPAIR OF RETINAL DEATACHMENT      Assessment / Plan / Recommendation Clinical Impression HPI: Edwin Dickerson is a 73 year old right-handed male with history of hyperlipidemia, TIA, BPH,, class I obesity BMI 31.32, left detached retina and recent eye surgery per Dr. Rankin Burke. Per chart review patient lives with spouse. Presented 02/01/2024 with progressive altered mental status/word finding difficulties and right side weakness. By report patient had fallen from his bed and struck his head approximately 4 weeks ago with persistent headaches. Cranial CT scan showed mixed density subdural hematoma centered along the left frontal convexity (measuring up to 2.3 mm in thickness). Mass effect upon the underlying brain parenchyma with 8 mm rightward midline shift. Mild cerebral atrophy. Underwent left frontotemporoparietal craniotomy for evacuation of subdural hematoma 02/01/2024 per Dr. Alm Joshua. Postoperative cranial CT scan 02/02/2024 showed small new subarachnoid and subdural hemorrhage slight decrease in maximal thickness of subdural hematoma from 22 mm to 20 mm. Mild interval improvement of shift of  midline structures to the right now measuring 4 mm (previously 8 mm) and advised continued monitoring per neurosurgery. Decadron  protocol as indicated. Keppra  maintained for seizure prophylaxis. A follow-up cranial CT scan repeated 02/03/2024 showing postoperative changes from evacuation of left SDH. The SDH overall appeared a bit smaller compared with prior scan of 02/02/2024. The 4 mm midline shift was unchanged. No change in subarachnoid hemorrhage. JP drain was removed 02/03/2024. Tolerating a regular consistency diet. Therapy evaluations completed due to patient decreased functional mobility was admitted for a comprehensive rehab program.   Clinical Impression:  Bedside Swallow Evaluation: A bedside swallow evaluation was completed to assess for s/sx of oropharyngeal dysphagia. Oral mechanism exam WFL for mastication. POs administered included thin liquids and solids. Patient with timely mastication and complete oral clearance. Patient with consistent throat clears before and after PO intake in which he contributes to congestion. S/sx of aspiration reduced when patient was instructed to take small, single sips via cup edge.  Recommend regular/thin diet with use of standardized precautions including sitting upright during PO, taking small bites/sips at a slow rate and consumption of thin liquids via cup edge. Recommend intermittent supervision during mealtimes.  Cognitive- Linguistic: Patient was evaluated via nonstandard measures to assess expressive and receptive language. Expressive language is characterized by 100% accuracy during confrontational and responsive naming tasks. Repetition intact with x1 motor speech error at the single word level Fabiene for [place]). Receptive language is remarkable for 100% accuracy during simple yes/no questions and 80% accuracy during complex. Patient with 100% accuracy during simple and complex commands. Patient reports feeling as if the word doesn't come out how he thinks it  in his head Suspect apraxia > aphasia though patient would benefit from further evaluation. Patient oriented  x4 independently and recalled all necessary biographical information. Patient with intact sustained attention and informally observed mild deficits in short term memory and problem solving. Of note, patient unable to read/write at baseline and has 8th grade education.  Pt would benefit from skilled ST services to maximize communication, dysphagia and cognition in order to maximize functional independence at d/c. Anticipate patient will require supervision at d/c and TBD f/u SLP services.    Skilled Therapeutic Interventions          Patient evaluated using a non-standardized cognitive linguistic assessment and bedside swallow evaluation to assess current cognitive, communicative and swallowing function. See above for details.    SLP Assessment  Patient will need skilled Speech Lanaguage Pathology Services during CIR admission    Recommendations  SLP Diet Recommendations: Age appropriate regular solids;Thin Liquid Administration via: Cup Medication Administration: Whole meds with liquid Supervision: Patient able to self feed;Intermittent supervision to cue for compensatory strategies Compensations: Slow rate;Small sips/bites Postural Changes and/or Swallow Maneuvers: Seated upright 90 degrees;Upright 30-60 min after meal Oral Care Recommendations: Oral care BID Patient destination: Home Follow up Recommendations:  (TBD) Equipment Recommended: None recommended by SLP    SLP Frequency 1 to 3 out of 7 days   SLP Duration  SLP Intensity  SLP Treatment/Interventions 10-12 days  Minumum of 1-2 x/day, 30 to 90 minutes  Cognitive remediation/compensation;Dysphagia/aspiration precaution training;Internal/external aids;Speech/Language facilitation;Cueing hierarchy;Therapeutic Activities;Functional tasks;Multimodal communication approach;Patient/family education    Pain None reported    SLP Evaluation Cognition Overall Cognitive Status: Impaired/Different from baseline Arousal/Alertness: Awake/alert Orientation Level: Oriented X4 Year: 2025 Month: September Day of Week: Correct Attention: Sustained Sustained Attention: Appears intact Memory: Impaired Memory Impairment: Decreased short term memory Decreased Short Term Memory: Verbal basic Awareness: Impaired Problem Solving: Impaired Problem Solving Impairment: Verbal basic  Comprehension Auditory Comprehension Overall Auditory Comprehension: Impaired Yes/No Questions: Impaired Basic Biographical Questions: 76-100% accurate Basic Immediate Environment Questions: 75-100% accurate Complex Questions: 75-100% accurate Commands: Within Functional Limits Multistep Basic Commands: 75-100% accurate Reading Comprehension Reading Status:  (pt does not read at b/l) Expression Expression Primary Mode of Expression: Verbal Verbal Expression Overall Verbal Expression: Other (comment) Initiation: No impairment Automatic Speech: Name;Social Response;Counting;Day of week;Month of year Level of Generative/Spontaneous Verbalization: Conversation Repetition: Impaired (x1 motor speech error at the word level) Naming: No impairment Pragmatics: No impairment Oral Motor Oral Motor/Sensory Function Overall Oral Motor/Sensory Function: Mild impairment Facial ROM: Within Functional Limits Facial Symmetry: Within Functional Limits Facial Strength: Within Functional Limits Lingual ROM: Within Functional Limits Lingual Symmetry: Within Functional Limits Lingual Strength: Within Functional Limits Velum: Within Functional Limits Mandible: Within Functional Limits Motor Speech Overall Motor Speech: Impaired Respiration: Within functional limits Phonation: Normal Resonance: Within functional limits Articulation: Within functional limitis Intelligibility: Intelligible Motor Planning: Impaired Motor Speech Errors:  Aware  Care Tool Care Tool Cognition Ability to hear (with hearing aid or hearing appliances if normally used Ability to hear (with hearing aid or hearing appliances if normally used): 0. Adequate - no difficulty in normal conservation, social interaction, listening to TV   Expression of Ideas and Wants Expression of Ideas and Wants: 3. Some difficulty - exhibits some difficulty with expressing needs and ideas (e.g, some words or finishing thoughts) or speech is not clear   Understanding Verbal and Non-Verbal Content Understanding Verbal and Non-Verbal Content: 4. Understands (complex and basic) - clear comprehension without cues or repetitions  Memory/Recall Ability Memory/Recall Ability : Current season;Location of own room;Staff names and faces;That he or she is in a  hospital/hospital unit    Bedside Swallowing Assessment General Diet Prior to this Study: Regular;Thin liquids (Level 0) Respiratory Status: Room air Behavior/Cognition: Alert;Cooperative Self-Feeding Abilities: Able to feed self Patient Positioning: Upright in chair/Tumbleform Baseline Vocal Quality: Normal (currently hoarse- patient reports this is a change though notes congestion) Volitional Cough: Strong Volitional Swallow: Able to elicit  Ice Chips Ice chips: Not tested Thin Liquid Thin Liquid: Impaired Presentation: Cup;Straw Pharyngeal  Phase Impairments: Throat Clearing - Delayed;Throat Clearing - Immediate Nectar Thick Nectar Thick Liquid: Not tested Honey Thick Honey Thick Liquid: Not tested Puree Puree: Not tested Solid Solid: Impaired Presentation: Self Fed Pharyngeal Phase Impairments: Throat Clearing - Delayed;Throat Clearing - Immediate BSE Assessment Risk for Aspiration Impact on safety and function: Mild aspiration risk  Short Term Goals: Week 1: SLP Short Term Goal 1 (Week 1): Patient will consume least restrictive diet with use of safe swallowing strategies given min multimodal A SLP  Short Term Goal 2 (Week 1): Patient will recall and utilize memory compensatory strategies given min multimodal A SLP Short Term Goal 3 (Week 1): Patient will demonstrate functional problem solving skills given min multimodal A SLP Short Term Goal 4 (Week 1): Patient will verbally communicate wants/needs with min multimodal A  Refer to Care Plan for Long Term Goals  Recommendations for other services: Neuropsych and Therapeutic Recreation  Pet therapy  Discharge Criteria: Patient will be discharged from SLP if patient refuses treatment 3 consecutive times without medical reason, if treatment goals not met, if there is a change in medical status, if patient makes no progress towards goals or if patient is discharged from hospital.  The above assessment, treatment plan, treatment alternatives and goals were discussed and mutually agreed upon: by patient  Nitish Roes M.A., CCC-SLP 02/07/2024, 11:16 AM

## 2024-02-08 DIAGNOSIS — S065X9S Traumatic subdural hemorrhage with loss of consciousness of unspecified duration, sequela: Secondary | ICD-10-CM | POA: Diagnosis not present

## 2024-02-08 LAB — GLUCOSE, CAPILLARY: Glucose-Capillary: 142 mg/dL — ABNORMAL HIGH (ref 70–99)

## 2024-02-08 MED ORDER — SENNA 8.6 MG PO TABS
1.0000 | ORAL_TABLET | Freq: Every day | ORAL | Status: DC
  Administered 2024-02-09 – 2024-02-13 (×5): 8.6 mg via ORAL
  Filled 2024-02-08 (×5): qty 1

## 2024-02-08 MED ORDER — GUAIFENESIN 100 MG/5ML PO LIQD
5.0000 mL | ORAL | Status: DC | PRN
  Administered 2024-02-08 – 2024-02-09 (×2): 5 mL via ORAL
  Filled 2024-02-08 (×2): qty 5

## 2024-02-08 NOTE — Progress Notes (Signed)
   02/08/24 0700  Behavioral Plan Guideline  [place] Level VII-Automatic, Appropriate (oriented x4)  Behavior to decrease/eliminate  (n/a)  Changes to environment  (n/a)  Interventions  (utilize regular safety plan)  Recommendations for interactions with patient  (n/a)  In Attendance at Behavior Plan Meeting  OT;SLP;RN

## 2024-02-08 NOTE — Progress Notes (Signed)
 Speech Language Pathology Daily Session Note  Patient Details  Name: Edwin Dickerson MRN: [id number] Date of Birth: January 26, 1951  Today's Date: 02/08/2024 SLP Individual Time: 9154-9053 SLP Individual Time Calculation (min): 61 min  Short Term Goals: Week 1: SLP Short Term Goal 1 (Week 1): Patient will consume least restrictive diet with use of safe swallowing strategies given min multimodal A SLP Short Term Goal 2 (Week 1): Patient will recall and utilize memory compensatory strategies given min multimodal A SLP Short Term Goal 3 (Week 1): Patient will demonstrate functional problem solving skills given min multimodal A SLP Short Term Goal 4 (Week 1): Patient will verbally communicate wants/needs with min multimodal A  Skilled Therapeutic Interventions:  Patient was seen in am to address cognitive re- training, speech, and dysphagia management. Pt was alert and agreeable for session. Pt reporting congestion and noted with frequent throat clearing upon SLP arrival. He requested a warm beverage. Pt intermittently consumed thin liquids via cup edge throughout session. Throat clearing persisted across session though did not appear related to abnormal swallow function though should continue to be monitored. Pt reports nursing aware of congestion and has followed up with MD for medication intervention. SLP addressing memory during session through introduction of repetition and association strategies along with examples of utilization. Pt challenged in a paragraph retention task and given a 10 minute distracted delay, pt recalled information with 100% acc with min A. In other minutes of session, SLP assessing language vs. speech changes. Pt answered biographical and personal questions with minimal difficulty though warranting occasional intermittent time and repetition due to errors which pt indep corrected. He completed responsive and divergent naming tasks mod I. Pt also engaged in informal conversation  intermittently throughout session with adequate speech intelligibility and no overt speech errors.Pt left upright in chair with call button within reach and chair alarm active. SLP to continue POC.  Pain Pain Assessment Pain Scale: 0-10 Pain Score: 0-No pain  Therapy/Group: Individual Therapy  Edwin Dickerson 02/08/2024, 8:48 AM

## 2024-02-08 NOTE — Progress Notes (Signed)
 PROGRESS NOTE   Subjective/Complaints: C/o congestion, guaifenesin  ordered No other complaints Headaches are well controlled Team conference today  ROS: +headache, +congestion   Objective:   No results found. Recent Labs    02/07/24 0447  WBC 12.1*  HGB 14.2  HCT 39.2  PLT 199   Recent Labs    02/07/24 0447  NA 136  K 4.1  CL 104  CO2 23  GLUCOSE 129*  BUN 26*  CREATININE 1.05  CALCIUM  8.7*    Intake/Output Summary (Last 24 hours) at 02/08/2024 1138 Last data filed at 02/08/2024 0810 Gross per 24 hour  Intake 474 ml  Output --  Net 474 ml        Physical Exam: Vital Signs Blood pressure 135/84, pulse 65, temperature 98.2 F (36.8 C), temperature source Oral, resp. rate 16, height 5' 7 (1.702 m), weight 91.2 kg, SpO2 97%. Gen: no distress, normal appearing HEENT: oral mucosa pink and moist, NCAT Cardio: Bradycardia Chest: normal effort, normal rate of breathing Abd: soft, non-distended Ext: no edema Psych: pleasant, normal affect Skin: intact Motor strength is 5/5 bilateral deltoid bicep tricep grip as well as hip flexor knee extensor ankle dorsiflexor Sit to stand is with supervision Sensation intact light touch bilateral upper and lower limbs Cerebellar normal finger-nose-finger testing bilateral upper limbs Finger to thumb opposition intact bilateral upper limbs There is no evidence of dysdiadochokinesis with rapid alternating supination pronation of bilateral upper extremities General no acute distress Mood and affect without lability or agitation Lungs are clear to auscultation Abdomen positive bowel sounds soft nontender to palpation Extremities without clubbing cyanosis or edema Musculoskeletal: Normal shoulder elbow wrist hand hip knee and ankle range of motion Negative straight leg raising bilaterally, stable 9/10   Assessment/Plan: 1. Functional deficits which require 3+ hours  per day of interdisciplinary therapy in a comprehensive inpatient rehab setting. Physiatrist is providing close team supervision and 24 hour management of active medical problems listed below. Physiatrist and rehab team continue to assess barriers to discharge/monitor patient progress toward functional and medical goals  Care Tool:  Bathing    Body parts bathed by patient: Right arm, Left arm, Chest, Abdomen, Front perineal area, Buttocks, Right upper leg, Left upper leg, Right lower leg, Left lower leg, Face         Bathing assist Assist Level: Contact Guard/Touching assist     Upper Body Dressing/Undressing Upper body dressing   What is the patient wearing?: Pull over shirt    Upper body assist Assist Level: Set up assist    Lower Body Dressing/Undressing Lower body dressing      What is the patient wearing?: Underwear/pull up, Pants     Lower body assist Assist for lower body dressing: Contact Guard/Touching assist     Toileting Toileting    Toileting assist Assist for toileting: Supervision/Verbal cueing     Transfers Chair/bed transfer  Transfers assist     Chair/bed transfer assist level: Contact Guard/Touching assist     Locomotion Ambulation   Ambulation assist      Assist level: Minimal Assistance - Patient > 75% Assistive device: No Device Max distance: 200   Walk 10 feet activity  Assist     Assist level: Minimal Assistance - Patient > 75% Assistive device: No Device   Walk 50 feet activity   Assist    Assist level: Minimal Assistance - Patient > 75% Assistive device: No Device    Walk 150 feet activity   Assist    Assist level: Minimal Assistance - Patient > 75% Assistive device: No Device    Walk 10 feet on uneven surface  activity   Assist     Assist level: Minimal Assistance - Patient > 75% Assistive device: Other (comment) (no device)   Wheelchair     Assist Is the patient using a wheelchair?: No              Wheelchair 50 feet with 2 turns activity    Assist            Wheelchair 150 feet activity     Assist          Blood pressure 135/84, pulse 65, temperature 98.2 F (36.8 C), temperature source Oral, resp. rate 16, height 5' 7 (1.702 m), weight 91.2 kg, SpO2 97%.  Medical Problem List and Plan: 1. 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  Status post left frontal temporal parietal craniotomy evacuation of SDH 02/01/2024 per Dr. Alm Molt.  JP drain removed 02/03/2024.  Decadron  taper as indicated             -patient may  shower with shower cap             -ELOS/Goals: 10-12d Supervision   Grounds pass ordered  Continue CIR  2.  Antithrombotics: -DVT/anticoagulation:  Mechanical: Antiembolism stockings, thigh (TED hose) Bilateral lower extremities             -antiplatelet therapy: N/A  3. Pain Management: continue Hydrocodone  5-325 mg 1 tablet every 4 hours as needed  4. Mood/Behavior/Sleep: continue Klonopin  0.5 mg nightly             -antipsychotic agents: N/A  5. Neuropsych/cognition: This patient is not capable of making decisions on his own behalf.  6. Congestion: guaifenesin  ordered  7. Fluids/Electrolytes/Nutrition: Routine and analysis with follow-up chemistries 8.  Seizure prophylaxis.  Keppra  500 mg twice daily- continue until 9/11  9.  Hyperlipidemia.  Continue Lipitor  10.  Hypertension: continue Cozaar  50 mg daily.  Monitor with increased mobility  11.  Left detached retina.  Recent eye surgery.  Follow-up outpatient ophthalmology Dr. Rankin Burke [phone]  12.  Class I obesity.  BMI 31.32.  Dietary follow-up  13.  Constipation.  Senokot 1 tablet twice daily, Colace 100 mg twice daily, asked nursing to bring him prune juice  14.  BPH: continue proscar , messaged nursing regarding PVRs  15. Bradycardia: medications reviewed and he is not on any rate lowering agents  LOS: 2 days A FACE TO FACE EVALUATION WAS  PERFORMED  Sven P Winslow Verrill 02/08/2024, 11:38 AM

## 2024-02-08 NOTE — Progress Notes (Signed)
 Speech Language Pathology Daily Session Note  Patient Details  Name: Denvil Canning MRN: [id number] Date of Birth: July 31, 1950  Today's Date: 02/08/2024 SLP Individual Time: 1115-1145 SLP Individual Time Calculation (min): 30 min  Short Term Goals: Week 1: SLP Short Term Goal 1 (Week 1): Patient will consume least restrictive diet with use of safe swallowing strategies given min multimodal A SLP Short Term Goal 2 (Week 1): Patient will recall and utilize memory compensatory strategies given min multimodal A SLP Short Term Goal 3 (Week 1): Patient will demonstrate functional problem solving skills given min multimodal A SLP Short Term Goal 4 (Week 1): Patient will verbally communicate wants/needs with min multimodal A  Skilled Therapeutic Interventions: Skilled therapy session focused on communication goals. SLP facilitated session by prompting patient to utilize describe strategy. Patient described simple words with adequate detail given minA. Patient with no apraxic nor aphasic errors this session, though occasionally repeating short phrases. Unsure if this is baseline. Patient left in chair with alarm set and call bell in reach. Continue POC.   Pain None reported   Therapy/Group: Individual Therapy  Rommel Hogston M.A., CCC-SLP 02/08/2024, 7:47 AM

## 2024-02-08 NOTE — Progress Notes (Signed)
 Physical Therapy Session Note  Patient Details  Name: Edwin Dickerson MRN: [id number] Date of Birth: May 05, 1951  Today's Date: 02/08/2024 PT Individual Time: 1400-1457 PT Individual Time Calculation (min): 57 min   Short Term Goals: Week 1:  PT Short Term Goal 1 (Week 1): STG=LTG 2/2 ELOS  Skilled Therapeutic Interventions/Progress Updates:      Pt sitting up in w/c to start - agreeable to therapy treatment. Denies any pain but reports need to void. Sit<>stand with CGA from recliner without UE support or AD - cautious and slow to ambulate to the bathroom where he's continent of bladder void in standing with CGA for balance due to slight posterior bias. Pt completes hand washing at the sink while standing with CGA for balance and continues mobility to main gym ~16ft with CGA for balance. Pt has no overt LOB or knee buckling, does tend to sway/veer while ambulating and slightly unsteady.   FGA completed with results outlined below   Patient demonstrates increased fall risk as noted by score of 14/30 on  Functional Gait Assessment.   <22/30 = predictive of falls, <20/30 = fall in 6 months, <18/30 = predictive of falls in PD MCID: 5 points stroke population, 4 points geriatric population (ANPTA Core Set of Outcome Measures for Adults with Neurologic Conditions, 2018)  BERG balance test completed with results outlined below.   Patient demonstrates increased fall risk as noted by score of   44/56 on Berg Balance Scale.  (<36= high risk for falls, close to 100%; 37-45 significant >80%; 46-51 moderate >50%; 52-55 lower >25%)  Long distance gait training > 459ft with CGA to close supervision with no AD - keeps his RUE in high guard position, worked on natural arm swing with min cues for safety awareness of navigating unfamiliar environments. Required directional cues to locate room. Provided ice water and left with his chair alarm on, needs met.     Therapy Documentation Precautions:   Precautions Precautions: Fall Restrictions Weight Bearing Restrictions Per Provider Order: No General:    Balance: Balance Balance Assessed: Yes Standardized Balance Assessment Standardized Balance Assessment: Functional Gait Assessment;Berg Balance Test Berg Balance Test Sit to Stand: Able to stand without using hands and stabilize independently Standing Unsupported: Able to stand 2 minutes with supervision Sitting with Back Unsupported but Feet Supported on Floor or Stool: Able to sit safely and securely 2 minutes Stand to Sit: Sits safely with minimal use of hands Transfers: Able to transfer safely, definite need of hands Standing Unsupported with Eyes Closed: Able to stand 10 seconds with supervision Standing Ubsupported with Feet Together: Able to place feet together independently and stand for 1 minute with supervision From Standing, Reach Forward with Outstretched Arm: Can reach forward >5 cm safely (2) From Standing Position, Pick up Object from Floor: Able to pick up shoe, needs supervision From Standing Position, Turn to Look Behind Over each Shoulder: Looks behind from both sides and weight shifts well Turn 360 Degrees: Able to turn 360 degrees safely one side only in 4 seconds or less Standing Unsupported, Alternately Place Feet on Step/Stool: Able to stand independently and complete 8 steps >20 seconds Standing Unsupported, One Foot in Front: Able to plae foot ahead of the other independently and hold 30 seconds Standing on One Leg: Able to lift leg independently and hold equal to or more than 3 seconds Total Score: 44/56  Functional Gait  Assessment Gait assessed : Yes Gait Level Surface: Walks 20 ft, slow speed, abnormal gait pattern,  evidence for imbalance or deviates 10-15 in outside of the 12 in walkway width. Requires more than 7 sec to ambulate 20 ft. Change in Gait Speed: Able to change speed, demonstrates mild gait deviations, deviates 6-10 in outside of the  12 in walkway width, or no gait deviations, unable to achieve a major change in velocity, or uses a change in velocity, or uses an assistive device. Gait with Horizontal Head Turns: Performs head turns smoothly with slight change in gait velocity (eg, minor disruption to smooth gait path), deviates 6-10 in outside 12 in walkway width, or uses an assistive device. Gait with Vertical Head Turns: Performs task with slight change in gait velocity (eg, minor disruption to smooth gait path), deviates 6 - 10 in outside 12 in walkway width or uses assistive device Gait and Pivot Turn: Pivot turns safely in greater than 3 sec and stops with no loss of balance, or pivot turns safely within 3 sec and stops with mild imbalance, requires small steps to catch balance. Step Over Obstacle: Is able to step over one shoe box (4.5 in total height) but must slow down and adjust steps to clear box safely. May require verbal cueing. Gait with Narrow Base of Support: Ambulates less than 4 steps heel to toe or cannot perform without assistance. Gait with Eyes Closed: Walks 20 ft, slow speed, abnormal gait pattern, evidence for imbalance, deviates 10-15 in outside 12 in walkway width. Requires more than 9 sec to ambulate 20 ft. Ambulating Backwards: Walks 20 ft, slow speed, abnormal gait pattern, evidence for imbalance, deviates 10-15 in outside 12 in walkway width. Steps: Alternating feet, must use rail. Total Score: 14/30  Therapy/Group: Individual Therapy  Sherlean SHAUNNA Perks 02/08/2024, 7:53 AM

## 2024-02-08 NOTE — Patient Care Conference (Signed)
 Inpatient RehabilitationTeam Conference and Plan of Care Update Date: 02/08/2024   Time: 11:07 AM    Patient Name: Edwin Dickerson      Medical Record Number: [id number]  Date of Birth: September 19, 1950 Sex: Male         Room/Bed: 4W15C/4W15C-01 Payor Info: Payor: MEDICARE / Plan: MEDICARE PART A AND B / Product Type: *No Product type* /    Admit Date/Time:  02/06/2024  2:39 PM  Primary Diagnosis:  Traumatic subdural hematoma [facility])  Hospital Problems: Principal Problem:   Traumatic subdural hematoma [facility])    Expected Discharge Date: Expected Discharge Date: 02/14/24  Team Members Present: Social Worker Present: Other (comment) Latanya Gentry, SW) Nurse Present: Barnie Ronde, RN PT Present: Sherlean Perks, PT OT Present: Monica Peacock, OT SLP Present: Rosina Downy, SLP     Current Status/Progress Goal Weekly Team Focus  Bowel/Bladder   continent x2   to be continent   to assess continence / be at baseline    Swallow/Nutrition/ Hydration   reg/thin   supervision  tolerance, use of aspiration precautions, need for MBS?    ADL's   Min assist with BADL   Mod I   Improve safety and independence with BADL    Mobility   CGA/minA for functional mobility without the use of an AD   supervision gait, mod I transfers  standing balance, dynamic balance, gait training, safety training, endurance and activity tolerance.    Communication   occasional apraxic errors   supervision   apraxic vs aphasic, compensatory strategies    Safety/Cognition/ Behavioral Observations  mild-mod problem solving and memory deficits   supervision   strategies, functional problem solving, short term memory    Pain   no pain   no pain   no pain    Skin   incision to scalp/head   incision will heal free from infection  wound care      Discharge Planning:  Plans to return home with wife, [name]. He will recieve assistance and support from his children as needed. Awaiting therapy DME and  follow up recommendations.   Team Discussion: Patient admitted post fall with SDH/crani with balance issues, right sided weakness and headaches, apraxia vs aphasia and mild problem solving deficits with poor safety awareness.  Patient on target to meet rehab goals: yes, currently patient needs min assist for ADLs. Needs CGA to mobilize without an assistive device but struggles with right sway and maintaining dynamic balance.  Goals for discharge set for mod I - supervision overall.  *See Care Plan and progress notes for long and short-term goals.   Revisions to Treatment Plan:  N/a   Teaching Needs: Safety, medications, fall prevention, transfers, toileting, etc.   Current Barriers to Discharge: Decreased caregiver support and Home enviroment access/layout  Possible Resolutions to Barriers: Family education     Medical Summary Current Status: subdural hematoma, congestion, HLD, HTN. constipation, headache  Barriers to Discharge: Medical stability  Barriers to Discharge Comments: subdural hematoma. congestion, HLD, HTN, constipation, headache Possible Resolutions to [employer]: continue to monitor scalp staples, continue steroid taper, will start weaning keppra  on 9/11, will add guaifenacin, continue lipitor, continue cozaar , decrease senna, continue prn tylenol    Continued Need for Acute Rehabilitation Level of Care: The patient requires daily medical management by a physician with specialized training in physical medicine and rehabilitation for the following reasons: Direction of a multidisciplinary physical rehabilitation program to maximize functional independence : Yes Medical management of patient stability for increased activity  during participation in an intensive rehabilitation regime.: Yes Analysis of laboratory values and/or radiology reports with any subsequent need for medication adjustment and/or medical intervention. : Yes   I attest that I was present,  lead the team conference, and concur with the assessment and plan of the team.   Fredericka Sober B 02/08/2024, 3:57 PM

## 2024-02-08 NOTE — Progress Notes (Signed)
 Occupational Therapy Session Note  Patient Details  Name: Edwin Dickerson MRN: [id number] Date of Birth: 06-15-1950  Today's Date: 02/08/2024 OT Individual Time: 9264-9164 OT Individual Time Calculation (min): 60 min    Short Term Goals: Week 1:  OT Short Term Goal 1 (Week 1): STG=LTG mod I overall for ALDs  Skilled Therapeutic Interventions/Progress Updates:   Patient received sitting on edge of bed.  Patient declined shower after taking shower yesterday.  Patient declined changing clothing - clothing clean and wife bringing in additional clothing.  Agreeable to walk to sink to complete grooming.  Patient with minor rightward drift when he first stands, but walked to sink with contact guard.  Patient stands at sink with supervision.  Patient able to don ted hose while seated edge of bed.  Patient concerned regarding hoarseness of voice, and difficulty clearing throat. Secure message sent to team.  Patient walked to gym with contact guard - more rightward drift noted in open space.  Patient aware - did well with cueing to use visual cues - e.g. flooring tiles to align himself in middle of hallway.  In gym completed 9 hole peg test:  38.86 right  dominant;  35.90 Left.   Patient indicated need to void and walked to bathroom - standing to urinate.  Patient left up in recliner with safety belt in place and engaged and call bell/ personal items in reach.    Therapy Documentation Precautions:  Precautions Precautions: Fall Restrictions Weight Bearing Restrictions Per Provider Order: No  Pain: Pain Assessment Pain Scale: 0-10 Pain Score: 0-No pain     Therapy/Group: Individual Therapy  Alexes Lamarque M 02/08/2024, 9:37 AM

## 2024-02-08 NOTE — Plan of Care (Signed)
  Problem: RH BOWEL ELIMINATION Goal: RH STG MANAGE BOWEL WITH ASSISTANCE Description: STG Manage Bowel with mod I Assistance. Outcome: Progressing Goal: RH STG MANAGE BOWEL W/MEDICATION W/ASSISTANCE Description: STG Manage Bowel with Medication with mod I Assistance. Outcome: Progressing   Problem: RH BLADDER ELIMINATION Goal: RH STG MANAGE BLADDER WITH ASSISTANCE Description: STG Manage Bladder With mod I Assistance Outcome: Progressing Goal: RH STG MANAGE BLADDER WITH MEDICATION WITH ASSISTANCE Description: STG Manage Bladder With Medication With  mod I Assistance. Outcome: Progressing   Problem: RH SAFETY Goal: RH STG ADHERE TO SAFETY PRECAUTIONS W/ASSISTANCE/DEVICE Description: STG Adhere to Safety Precautions With  cues Assistance/Device. Outcome: Progressing   Problem: RH PAIN MANAGEMENT Goal: RH STG PAIN MANAGED AT OR BELOW PT'S PAIN GOAL Description: Pain < 4 with prns Outcome: Progressing   Problem: RH KNOWLEDGE DEFICIT BRAIN INJURY Goal: RH STG INCREASE KNOWLEDGE OF SELF CARE AFTER BRAIN INJURY Description: Patient and spouse will be able to manage care at discharge using educational resources for medications and dietary modification, fall prevention independently Outcome: Progressing

## 2024-02-09 DIAGNOSIS — S065X9S Traumatic subdural hemorrhage with loss of consciousness of unspecified duration, sequela: Secondary | ICD-10-CM | POA: Diagnosis not present

## 2024-02-09 MED ORDER — MAGNESIUM GLUCONATE 500 (27 MG) MG PO TABS
250.0000 mg | ORAL_TABLET | Freq: Every day | ORAL | Status: DC
  Administered 2024-02-09 – 2024-02-14 (×6): 250 mg via ORAL
  Filled 2024-02-09 (×6): qty 1

## 2024-02-09 MED ORDER — LEVETIRACETAM 500 MG PO TABS: 500.0000 mg | ORAL_TABLET | Freq: Every day | ORAL | Status: DC

## 2024-02-09 MED ORDER — MAGNESIUM CITRATE PO SOLN
1.0000 | Freq: Once | ORAL | Status: AC
  Administered 2024-02-09: 1 via ORAL
  Filled 2024-02-09: qty 296

## 2024-02-09 NOTE — Progress Notes (Signed)
 Physical Therapy Session Note  Patient Details  Name: Edwin Dickerson MRN: [id number] Date of Birth: 10-15-1950  Today's Date: 02/09/2024 PT Individual Time: 1305-1400 PT Individual Time Calculation (min): 55 min   Short Term Goals: Week 1:  PT Short Term Goal 1 (Week 1): STG=LTG 2/2 ELOS  Skilled Therapeutic Interventions/Progress Updates:      Pt sleeping in recliner on arrival - reports feeling drowsy from allergy medicine he received. Offered coffee and he was agreeable. Sit<>stand with CGA with no AD. He ambulates with CGA and no AD through hallways, light minA for turning due to unsteadiness and delayed stepping strategies. Pt completed x12 minutes of Nustep at L4 resistance, using BUE/BLE, cadence > 65spm for the entirety of time. >800 steps total.  Retrieved rollator to use as AD to help him achieve mod I level. Pt agreeable and reports he has one at home he can use. Adjusted rollator to height and educated on general safety precautions. He ambulated at supervision level with the rollator > 550ft, cues for keeping his body closer to walker and relaxing his shoulders. Pt much more stable with BUE support.   Car transfer training reviewed with CGA while using the rollator - cues for general setup but patient able to recall sequencing and can get his LE in/out without external assist.  Higher level balance training in // bars: -feet apart eyes open on blue air-ex pad -feet together eyes open/closed on blue air-ex pad -feet semi-tandem eyes open on blue air-ex pad -feet semi-tandem eyes open with perturbations on blue air-ex pad.  *standing rest breaks, no UE support throughout.   Pt ambulated back to his room using rollator at supervision level, min cues for locking brakes before sitting/standing. He requested to use the restroom before returning to bed. Pt continent of bladder void in standing with supervision for standing balance. Bed mobility completed without assist. Alarm on, needs  met.   Therapy Documentation Precautions:  Precautions Precautions: Fall Restrictions Weight Bearing Restrictions Per Provider Order: No General:      Therapy/Group: Individual Therapy  Sherlean SHAUNNA Perks 02/09/2024, 7:41 AM

## 2024-02-09 NOTE — Progress Notes (Signed)
 Occupational Therapy Session Note  Patient Details  Name: Edwin Dickerson MRN: [id number] Date of Birth: [birth year]  Today's Date: 02/09/2024 OT Individual Time: 0845-1000 OT Individual Time Calculation (min): 75 min    Short Term Goals: Week 1:  OT Short Term Goal 1 (Week 1): STG=LTG mod I overall for ALDs  Skilled Therapeutic Interventions/Progress Updates:    Patient received supine in bed, awake and alert and eager to take a shower.  Patient transitioned to sitting without physical assistance, and walked to bathroom.  Patient had continent void standing at toilet.  Patient removed clothing while standing up using stool as needed to place foot on in standing.  Patient showered without shower bench - able to reach to floor with and without UE support.  Patient aware not to get incision wet and made adjustments accordingly.  Patient moves slowly and carefully and braces on wall as needed.  Patient dressed himself including compression stockings with set up assistance.  Patient asking to complete grooming, and stood at sink with distant supervision to brush his teeth and shave.   Walked to ADL apartment to address tub/shower transfers.  Patient with one loss of balance corrected with min assist when turning head to make eye contact.  Patient able to step over bathtub, stabilizing on wall of shower.  Patient indicates wife is planning to buy a shower seat for him for home.  Walked to main gym to address gross motor coordination and dynamic balance.  Patient with posterior bias when challenged to walk and toss a ball.  Needed cueing to anticipate forward momentum.  Patient able to visually follow ball with up / down movement, more challenged with lateral head movement. Patient becoming short of breath as challenge increased but recovered with 1 min seated rest break.  Returned patient to room and patient indicated need to void.  Walked to toilet for continent BM.  Completed toileting without physical  assist and sat in recliner with safety belt in place and engaged and call bell/ personal items in reach.    Therapy Documentation Precautions:  Precautions Precautions: Fall Restrictions Weight Bearing Restrictions Per Provider Order: No  Pain:  No pain     Therapy/Group: Individual Therapy  Emilyann Banka M 02/09/2024, 12:30 PM

## 2024-02-09 NOTE — Progress Notes (Signed)
 Speech Language Pathology Daily Session Note  Patient Details  Name: Edwin Dickerson MRN: [id number] Date of Birth: 1950-12-06  Today's Date: 02/09/2024 SLP Individual Time: 1116-1201 SLP Individual Time Calculation (min): 45 min  Short Term Goals: Week 1: SLP Short Term Goal 1 (Week 1): Patient will consume least restrictive diet with use of safe swallowing strategies given min multimodal A SLP Short Term Goal 2 (Week 1): Patient will recall and utilize memory compensatory strategies given min multimodal A SLP Short Term Goal 3 (Week 1): Patient will demonstrate functional problem solving skills given min multimodal A SLP Short Term Goal 4 (Week 1): Patient will verbally communicate wants/needs with min multimodal A  Skilled Therapeutic Interventions: Skilled therapy session focused on cognitive goals. SLP facilitated completion of medication management task to encourage use of problem solving, memory and organizational skills. SLP provided minA for patient to complete BID pillbox according to written and verbalized directions. Patient with x1 mistakes, though able to self correct with min cues. SLP educate patient on continuing to utilize pillbox upon d/c to aid in planning, organization, and memory. Patient verbalized understanding. SLP continued to challenge patient through identification of medication mistakes task. Patient required minA to identify errors in BID pillbox and correct according to directions. Patient left in chair with alarm set and call bell in reach. Continue POC.   Pain None reported  Therapy/Group: Individual Therapy  Seyon Strader M.A., CCC-SLP 02/09/2024, 7:43 AM

## 2024-02-09 NOTE — Progress Notes (Signed)
 PROGRESS NOTE   Subjective/Complaints: No new complaints this morning Congestion has improved Discussed weaning Keppra  today, stopping tomorrow  ROS: +headache, +congestion- improved   Objective:   No results found. Recent Labs    02/07/24 0447  WBC 12.1*  HGB 14.2  HCT 39.2  PLT 199   Recent Labs    02/07/24 0447  NA 136  K 4.1  CL 104  CO2 23  GLUCOSE 129*  BUN 26*  CREATININE 1.05  CALCIUM  8.7*    Intake/Output Summary (Last 24 hours) at 02/09/2024 1013 Last data filed at 02/09/2024 0757 Gross per 24 hour  Intake 720 ml  Output --  Net 720 ml        Physical Exam: Vital Signs Blood pressure (!) 139/95, pulse (!) 52, temperature 97.9 F (36.6 C), temperature source Oral, resp. rate 18, height 5' 7 (1.702 m), weight 91.2 kg, SpO2 97%. Gen: no distress, normal appearing HEENT: oral mucosa pink and moist, NCAT Cardio: Bradycardia Chest: normal effort, normal rate of breathing Abd: soft, non-distended Ext: no edema Psych: pleasant, normal affect Skin: intact Motor strength is 5/5 bilateral deltoid bicep tricep grip as well as hip flexor knee extensor ankle dorsiflexor Sit to stand is with supervision Sensation intact light touch bilateral upper and lower limbs Cerebellar normal finger-nose-finger testing bilateral upper limbs Finger to thumb opposition intact bilateral upper limbs There is no evidence of dysdiadochokinesis with rapid alternating supination pronation of bilateral upper extremities General no acute distress Mood and affect without lability or agitation Lungs are clear to auscultation Abdomen positive bowel sounds soft nontender to palpation Extremities without clubbing cyanosis or edema Musculoskeletal: Normal shoulder elbow wrist hand hip knee and ankle range of motion Negative straight leg raising bilaterally, stable 9/11   Assessment/Plan: 1. Functional deficits which  require 3+ hours per day of interdisciplinary therapy in a comprehensive inpatient rehab setting. Physiatrist is providing close team supervision and 24 hour management of active medical problems listed below. Physiatrist and rehab team continue to assess barriers to discharge/monitor patient progress toward functional and medical goals  Care Tool:  Bathing    Body parts bathed by patient: Right arm, Left arm, Chest, Abdomen, Front perineal area, Buttocks, Right upper leg, Left upper leg, Right lower leg, Left lower leg, Face         Bathing assist Assist Level: Contact Guard/Touching assist     Upper Body Dressing/Undressing Upper body dressing   What is the patient wearing?: Pull over shirt    Upper body assist Assist Level: Set up assist    Lower Body Dressing/Undressing Lower body dressing      What is the patient wearing?: Underwear/pull up, Pants     Lower body assist Assist for lower body dressing: Contact Guard/Touching assist     Toileting Toileting    Toileting assist Assist for toileting: Supervision/Verbal cueing     Transfers Chair/bed transfer  Transfers assist     Chair/bed transfer assist level: Contact Guard/Touching assist     Locomotion Ambulation   Ambulation assist      Assist level: Minimal Assistance - Patient > 75% Assistive device: No Device Max distance: 200   Walk  10 feet activity   Assist     Assist level: Minimal Assistance - Patient > 75% Assistive device: No Device   Walk 50 feet activity   Assist    Assist level: Minimal Assistance - Patient > 75% Assistive device: No Device    Walk 150 feet activity   Assist    Assist level: Minimal Assistance - Patient > 75% Assistive device: No Device    Walk 10 feet on uneven surface  activity   Assist     Assist level: Minimal Assistance - Patient > 75% Assistive device: Other (comment) (no device)   Wheelchair     Assist Is the patient using a  wheelchair?: No             Wheelchair 50 feet with 2 turns activity    Assist            Wheelchair 150 feet activity     Assist          Blood pressure (!) 139/95, pulse (!) 52, temperature 97.9 F (36.6 C), temperature source Oral, resp. rate 18, height 5' 7 (1.702 m), weight 91.2 kg, SpO2 97%.  Medical Problem List and Plan: 1. 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  Status post left frontal temporal parietal craniotomy evacuation of SDH 02/01/2024 per Dr. Alm Molt.  JP drain removed 02/03/2024.  Decadron  taper as indicated             -patient may  shower with shower cap             -ELOS/Goals: 10-12d Supervision   Grounds pass ordered  Continue CIR  2.  Antithrombotics: -DVT/anticoagulation:  Mechanical: Antiembolism stockings, thigh (TED hose) Bilateral lower extremities             -antiplatelet therapy: N/A  3. Pain Management: continue Hydrocodone  5-325 mg 1 tablet every 4 hours as needed  4. Mood/Behavior/Sleep: continue Klonopin  0.5 mg nightly             -antipsychotic agents: N/A  5. Neuropsych/cognition: This patient is not capable of making decisions on his own behalf.  6. Congestion: guaifenesin  ordered  7. Fluids/Electrolytes/Nutrition: Routine and analysis with follow-up chemistries 8.  Seizure prophylaxis: completed 1 week course: decrease Keppra  to 500mg  daily  9.  Hyperlipidemia: continue Lipitor  10.  Hypertension: continue Cozaar  50 mg daily.  Monitor with increased mobility, magnesium  supplement started  11.  Left detached retina.  Recent eye surgery.  Follow-up outpatient ophthalmology Dr. Rankin Burke [phone]  12.  Class I obesity.  BMI 31.32.  Dietary follow-up  13.  Constipation.  Senokot 1 tablet twice daily, Colace 100 mg twice daily, asked nursing to bring him prune juice, magnesium  supplement started, last BM 9/9- messaged nursing to ensure accuracy  14.  BPH: continue proscar , messaged nursing regarding  PVRs  15. Bradycardia: medications reviewed and he is not on any rate lowering agents  LOS: 3 days A FACE TO FACE EVALUATION WAS PERFORMED  Ora Bollig P Takeisha Cianci 02/09/2024, 10:13 AM

## 2024-02-09 NOTE — Progress Notes (Signed)
 Occupational Therapy Session Note  Patient Details  Name: Edwin Dickerson MRN: [id number] Date of Birth: 11-Apr-1951  Today's Date: 02/09/2024 OT Individual Time: 1015-1050 OT Individual Time Calculation (min): 35 min    Short Term Goals: Week 1:  OT Short Term Goal 1 (Week 1): STG=LTG mod I overall for ALDs  Skilled Therapeutic Interventions/Progress Updates:    1:1 Pt received in the recliner. Focus on functional mobility without device with focus on motor /vestibular system with head and body turns to the right while walking without stopping, trunk rotation with weighted back around trunk in sitting reading numbers from behind, standing and bending down to pick up item off floor on the right behind him. Also performed mental math of adding scores up on Binko with assistance - did better with visual to be able to add numbers than mentally. Pt able to navigate back to his room with busy environment with supervision without cues for direction. Pt left sitting in the chair resting with safety belt donned.   Therapy Documentation Precautions:  Precautions Precautions: Fall Restrictions Weight Bearing Restrictions Per Provider Order: No  Pain: No reports of pain in session    Therapy/Group: Individual Therapy  [name] [facility] 02/09/2024, 1:24 PM

## 2024-02-09 NOTE — Plan of Care (Signed)
  Problem: Consults Goal: RH BRAIN INJURY PATIENT EDUCATION Description: Description: See Patient Education module for eduction specifics Outcome: Progressing   Problem: RH BOWEL ELIMINATION Goal: RH STG MANAGE BOWEL WITH ASSISTANCE Description: STG Manage Bowel with mod I Assistance. Outcome: Progressing Goal: RH STG MANAGE BOWEL W/MEDICATION W/ASSISTANCE Description: STG Manage Bowel with Medication with mod I Assistance. Outcome: Progressing   Problem: RH BLADDER ELIMINATION Goal: RH STG MANAGE BLADDER WITH ASSISTANCE Description: STG Manage Bladder With mod I Assistance Outcome: Progressing Goal: RH STG MANAGE BLADDER WITH MEDICATION WITH ASSISTANCE Description: STG Manage Bladder With Medication With  mod I Assistance. Outcome: Progressing   Problem: RH SAFETY Goal: RH STG ADHERE TO SAFETY PRECAUTIONS W/ASSISTANCE/DEVICE Description: STG Adhere to Safety Precautions With  cues Assistance/Device. Outcome: Progressing   Problem: RH PAIN MANAGEMENT Goal: RH STG PAIN MANAGED AT OR BELOW PT'S PAIN GOAL Description: Pain < 4 with prns Outcome: Progressing   Problem: RH KNOWLEDGE DEFICIT BRAIN INJURY Goal: RH STG INCREASE KNOWLEDGE OF SELF CARE AFTER BRAIN INJURY Description: Patient and spouse will be able to manage care at discharge using educational resources for medications and dietary modification, fall prevention independently Outcome: Progressing   Problem: RH Vision Goal: RH LTG Vision (Specify) Outcome: Progressing

## 2024-02-09 NOTE — IPOC Note (Signed)
 Overall Plan of Care [facility]) Patient Details Name: Edwin Dickerson MRN: [id number] DOB: [birth year]  Admitting Diagnosis: Traumatic subdural hematoma [facility])  Hospital Problems: Principal Problem:   Traumatic subdural hematoma (HCC)     Functional Problem List: Nursing Bladder, Bowel, Endurance, Medication Management, Pain, Safety  PT Balance, Safety, Endurance, Motor  OT Balance, Sensory, Edema, Cognition, Endurance, Motor, Perception, Safety, Vision  SLP Cognition, Linguistic, Nutrition  TR         Basic ADL's: OT Bathing, Dressing, Toileting, Grooming     Advanced  ADL's: OT None     Transfers: PT Bed Mobility, Bed to Chair, Car, Occupational psychologist, Research scientist (life sciences): PT Ambulation, Stairs     Additional Impairments: OT None  SLP Swallowing, Communication, Social Cognition expression, comprehension Problem Solving, Memory  TR      Anticipated Outcomes Item Anticipated Outcome  Self Feeding n/a  Swallowing  supervision   Basic self-care  mod  i  Toileting  mod I   Bathroom Transfers mod I  Bowel/Bladder  manage bowel and bladder w mod I assist  Transfers  Mod I  Locomotion  supervision  Communication  supervision  Cognition  supervision  Pain  PAin < 4 with prns  Safety/Judgment  manage safety w cues   Therapy Plan: PT Intensity: Minimum of 1-2 x/day ,45 to 90 minutes PT Frequency: 5 out of 7 days PT Duration Estimated Length of Stay: 7-10 days OT Intensity: Minimum of 1-2 x/day, 45 to 90 minutes OT Frequency: 5 out of 7 days OT Duration/Estimated Length of Stay: ~10 days SLP Intensity: Minumum of 1-2 x/day, 30 to 90 minutes SLP Frequency: 1 to 3 out of 7 days SLP Duration/Estimated Length of Stay: 10-12 days   Team Interventions: Nursing Interventions Patient/Family Education, Medication Management, Bladder Management, Bowel Management, Disease Management/Prevention, Pain Management, Discharge Planning  PT interventions  Ambulation/gait training, Community reintegration, Neuromuscular re-education, Stair training, UE/LE Strength taining/ROM, UE/LE Coordination activities, Therapeutic Activities, Discharge planning, Warden/ranger, Functional mobility training, Patient/family education, Therapeutic Exercise  OT Interventions Balance/vestibular training, Disease mangement/prevention, Neuromuscular re-education, Self Care/advanced ADL retraining, Therapeutic Exercise, Cognitive remediation/compensation, DME/adaptive equipment instruction, Pain management, Skin care/wound managment, UE/LE Strength taining/ROM, Community reintegration, Equities trader education, Splinting/orthotics, UE/LE Coordination activities, Discharge planning, Functional mobility training, Psychosocial support, Therapeutic Activities, Visual/perceptual remediation/compensation  SLP Interventions Cognitive remediation/compensation, Dysphagia/aspiration precaution training, Internal/external aids, Speech/Language facilitation, Cueing hierarchy, Therapeutic Activities, Functional tasks, Multimodal communication approach, Patient/family education  TR Interventions    SW/CM Interventions Discharge Planning, Psychosocial Support, Patient/Family Education, Disease Management/Prevention   Barriers to Discharge MD  Medical stability  Nursing Decreased caregiver support, Home environment access/layout 2 level main B+B w spouse  PT Home environment access/layout    OT None    SLP      SW       Team Discharge Planning: Destination: PT-Home ,OT- Home , SLP-Home Projected Follow-up: PT-Home health PT, OT-  Outpatient OT, SLP- (TBD) Projected Equipment Needs: PT-To be determined, OT- To be determined, SLP-None recommended by SLP Equipment Details: PT- , OT-  Patient/family involved in discharge planning: PT- Patient,  OT-Patient, SLP-Patient  MD ELOS: 10-12 days Medical Rehab Prognosis:  Excellent Assessment: The patient has been admitted  for CIR therapies with the diagnosis of traumatic SDH. The team will be addressing functional mobility, strength, stamina, balance, safety, adaptive techniques and equipment, self-care, bowel and bladder mgt, patient and caregiver education. Goals have been set at supervision. Anticipated discharge destination is home.  See Team Conference Notes for weekly updates to the plan of care

## 2024-02-10 DIAGNOSIS — S065X9S Traumatic subdural hemorrhage with loss of consciousness of unspecified duration, sequela: Secondary | ICD-10-CM | POA: Diagnosis not present

## 2024-02-10 MED ORDER — B COMPLEX-C PO TABS
1.0000 | ORAL_TABLET | Freq: Every day | ORAL | Status: DC
  Administered 2024-02-10 – 2024-02-14 (×5): 1 via ORAL
  Filled 2024-02-10 (×5): qty 1

## 2024-02-10 MED ORDER — L-METHYLFOLATE-B6-B12 3-35-2 MG PO TABS
1.0000 | ORAL_TABLET | Freq: Every day | ORAL | Status: DC
  Administered 2024-02-10 – 2024-02-14 (×5): 1 via ORAL
  Filled 2024-02-10 (×5): qty 1

## 2024-02-10 MED ORDER — VITAMIN D 25 MCG (1000 UNIT) PO TABS
1000.0000 [IU] | ORAL_TABLET | Freq: Every day | ORAL | Status: DC
  Administered 2024-02-10 – 2024-02-14 (×5): 1000 [IU] via ORAL
  Filled 2024-02-10 (×5): qty 1

## 2024-02-10 MED ORDER — DOCUSATE SODIUM 100 MG PO CAPS
100.0000 mg | ORAL_CAPSULE | Freq: Every day | ORAL | Status: DC
  Administered 2024-02-11 – 2024-02-14 (×4): 100 mg via ORAL
  Filled 2024-02-10 (×4): qty 1

## 2024-02-10 NOTE — Progress Notes (Signed)
 Physical Therapy Session Note  Patient Details  Name: Edwin Dickerson MRN: [id number] Date of Birth: [birth year]  Today's Date: 02/10/2024 PT Individual Time: 0730-0840 PT Individual Time Calculation (min): 70 min   Short Term Goals: Week 1:  PT Short Term Goal 1 (Week 1): STG=LTG 2/2 ELOS  Skilled Therapeutic Interventions/Progress Updates:   Received pt sitting EOB, pt agreeable to PT treatment, and denied any pain during session. Session with emphasis on functional mobility/transfers, generalized strengthening and endurance, dynamic standing balance/coordination, NMR, stair navigation, and ambulation. Donned shoes with setup assist, then stood with rollator and supervision and ambulated 176ft with rollator and supervision to main therapy gym. Pt performed all transfers with and without AD and supervision throughout session.   Pt ambulated to/from staircase with rollator and supervision and navigated 12 6in steps with 1 handrail and supervision ascending and descending with a step through pattern. Pt participated in BERG and demonstrates increased fall risk as noted by score of 46/56 on Berg Balance Scale.  (<36= high risk for falls, close to 100%; 37-45 significant >80%; 46-51 moderate >50%; 52-55 lower >25%)  6 Min Walk Test:  Instructed patient to ambulate as quickly and as safely as possible for 6 minutes using LRAD. Patient was allowed to take standing rest breaks without stopping the test, but if the patient required a sitting rest break the clock would be stopped and the test would be over.  Results: 724 feet (220.67 meters, Avg speed 0.64m/s) without AD with CGA fading to close supervision. Results indicate that the patient has reduced endurance with ambulation compared to age matched norms.  Age Matched Norms: 66-69 yo M: 73 F: 73, 1-79 yo M: 1 F: 471, 31-89 yo M: 417 F: 392 MDC: 58.21 meters (190.98 feet) or 50 meters (ANPTA Core Set of Outcome Measures for Adults with Neurologic  Conditions, 2018)  Pt then ambulated additional 639ft without AD and close supervision from [employer] back to 4W main gym. 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 Discussed safety and energy conservation strateiges - suggested starting off for ~15 minutes and working his way up (prior to admission would work for 6 hours). Emphasized importance of pursed lip breathing to avoid Valsalva and recognizing fatigue onset. Recommended bringing rollator and water bottle and taking rest breaks as needed on rollator. Pt reports he will have someone with him anytime he is outside.   Pt performed lateral side stepping<>squat x34ft in hallway with CGA fading to supervision with emphasis on quad and hip abductor strength. Ambulated 12ft with rollator and distant supervision back to room and into bathroom. Pt able to manage clothing without assist and concluded session with pt sitting on commode left in care of NT for toileting.   Therapy Documentation Precautions:  Precautions Precautions: Fall Restrictions Weight Bearing Restrictions Per Provider Order: No  Therapy/Group: Individual Therapy Therisa HERO Zaunegger Therisa Stains PT, DPT 02/10/2024, 7:09 AM

## 2024-02-10 NOTE — Progress Notes (Signed)
 PROGRESS NOTE   Subjective/Complaints: No new complaints this morning Had BM yesterday Discussed d/c of keppra  today since he has completed course  ROS: +headache, +congestion- improved, +dizziness   Objective:   No results found. No results for input(s): WBC, HGB, HCT, PLT in the last 72 hours.  No results for input(s): NA, K, CL, CO2, GLUCOSE, BUN, CREATININE, CALCIUM  in the last 72 hours.   Intake/Output Summary (Last 24 hours) at 02/10/2024 0945 Last data filed at 02/10/2024 0820 Gross per 24 hour  Intake 238 ml  Output --  Net 238 ml        Physical Exam: Vital Signs Blood pressure 136/83, pulse (!) 55, temperature (!) 97.4 F (36.3 C), resp. rate 16, height 5' 7 (1.702 m), weight 91.2 kg, SpO2 97%. Gen: no distress, normal appearing HEENT: oral mucosa pink and moist, NCAT Cardio: Bradycardia Chest: normal effort, normal rate of breathing Abd: soft, non-distended Ext: no edema Psych: pleasant, normal affect Skin: intact Motor strength is 5/5 bilateral deltoid bicep tricep grip as well as hip flexor knee extensor ankle dorsiflexor Sit to stand is with supervision Sensation intact light touch bilateral upper and lower limbs Cerebellar normal finger-nose-finger testing bilateral upper limbs Finger to thumb opposition intact bilateral upper limbs There is no evidence of dysdiadochokinesis with rapid alternating supination pronation of bilateral upper extremities General no acute distress Mood and affect without lability or agitation Lungs are clear to auscultation Abdomen positive bowel sounds soft nontender to palpation Extremities without clubbing cyanosis or edema Musculoskeletal: Normal shoulder elbow wrist hand hip knee and ankle range of motion Negative straight leg raising bilaterally, stable 9/12   Assessment/Plan: 1. Functional deficits which require 3+ hours per day of  interdisciplinary therapy in a comprehensive inpatient rehab setting. Physiatrist is providing close team supervision and 24 hour management of active medical problems listed below. Physiatrist and rehab team continue to assess barriers to discharge/monitor patient progress toward functional and medical goals  Care Tool:  Bathing    Body parts bathed by patient: Right arm, Left arm, Chest, Abdomen, Front perineal area, Buttocks, Right upper leg, Left upper leg, Right lower leg, Left lower leg, Face         Bathing assist Assist Level: Supervision/Verbal cueing     Upper Body Dressing/Undressing Upper body dressing   What is the patient wearing?: Pull over shirt    Upper body assist Assist Level: Independent    Lower Body Dressing/Undressing Lower body dressing      What is the patient wearing?: Underwear/pull up, Pants     Lower body assist Assist for lower body dressing: Supervision/Verbal cueing     Toileting Toileting    Toileting assist Assist for toileting: Supervision/Verbal cueing     Transfers Chair/bed transfer  Transfers assist     Chair/bed transfer assist level: Supervision/Verbal cueing     Locomotion Ambulation   Ambulation assist      Assist level: Supervision/Verbal cueing Assistive device: No Device Max distance: >530ft   Walk 10 feet activity   Assist     Assist level: Supervision/Verbal cueing Assistive device: No Device   Walk 50 feet activity   Assist  Assist level: Supervision/Verbal cueing Assistive device: No Device    Walk 150 feet activity   Assist    Assist level: Supervision/Verbal cueing Assistive device: No Device    Walk 10 feet on uneven surface  activity   Assist     Assist level: Minimal Assistance - Patient > 75% Assistive device: Other (comment) (no device)   Wheelchair     Assist Is the patient using a wheelchair?: No             Wheelchair 50 feet with 2 turns  activity    Assist            Wheelchair 150 feet activity     Assist          Blood pressure 136/83, pulse (!) 55, temperature (!) 97.4 F (36.3 C), resp. rate 16, height 5' 7 (1.702 m), weight 91.2 kg, SpO2 97%.  Medical Problem List and Plan: 1. 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  Status post left frontal temporal parietal craniotomy evacuation of SDH 02/01/2024 per Dr. Alm Molt.  JP drain removed 02/03/2024.  Decadron  taper as indicated             -patient may  shower with shower cap             -ELOS/Goals: 10-12d Supervision   Grounds pass ordered  Chart and therapy notes reviewed, continue CIR, discussed patient's progress with therapy Grounds pass ordered Vitamin D3/Metanx/Vitamin B/C complex ordered F/u with me or Fidela in clinic within 1 month Would benefit from home health aide upon discharge   2.  Antithrombotics: -DVT/anticoagulation:  Mechanical: Antiembolism stockings, thigh (TED hose) Bilateral lower extremities             -antiplatelet therapy: N/A  3. Pain Management: d/c norco  4. Mood/Behavior/Sleep: continue Klonopin  0.5 mg nightly             -antipsychotic agents: N/A  5. Neuropsych/cognition: This patient is not capable of making decisions on his own behalf.  6. Congestion: guaifenesin  ordered  7. Fluids/Electrolytes/Nutrition: Routine and analysis with follow-up chemistries 8.  Seizure prophylaxis: completed 1 week course: d/c keppra   9.  Hyperlipidemia: continue Lipitor  10.  Hypertension: continue Cozaar  50 mg daily.  Monitor with increased mobility, magnesium  supplement started  11.  Left detached retina.  Recent eye surgery.  Follow-up outpatient ophthalmology Dr. Rankin Burke [phone]  12.  Class I obesity.  BMI 31.32.  Dietary follow-up  13.  Constipation.  Senokot 1 tablet twice daily, decrease colace to daily, asked nursing to bring him prune juice, magnesium  supplement started, last BM 9/11  14.  BPH:  continue proscar , messaged nursing regarding PVRs  15. Bradycardia: medications reviewed and he is not on any rate lowering agents  LOS: 4 days A FACE TO FACE EVALUATION WAS PERFORMED  Edwin Dickerson 02/10/2024, 9:45 AM

## 2024-02-10 NOTE — Progress Notes (Signed)
 Occupational Therapy Session Note  Patient Details  Name: Edwin Dickerson MRN: [id number] Date of Birth: 10/16/1950  Today's Date: 02/10/2024 OT Individual Time: 1100-1158 OT Individual Time Calculation (min): 58 min    Short Term Goals: Week 1:  OT Short Term Goal 1 (Week 1): STG=LTG mod I overall for ALDs  Skilled Therapeutic Interventions/Progress Updates:    Patient received sitting in recliner.  He had some concern regarding having a bit more trouble finding his words this morning.  When asked a bit more about this patient indicates having word finding trouble after PT workout.  No changes noted this session.  Patient agreeable to shower and change clothing.  Walked to bathroom without device and with supervision.  Showered standing after set up.  Ted hose discontinued due to patient's improved mobility status.  Patient dressed while seated with only set up assistance, then completed grooming while standing at sink.   Walked to gym with supervision without loss of balance.  Worked on gross motor coordination and head/ eye movements while walking.  Patient with improved performance today - however still somewhat limited by depth perception issues (this appears to be more related to recent eye surgery for retinal detachment than to recent SDH.)  Spoke with patient about returning to driving.  Patient had not returned to driving since surgery in June.  Patient follows up with eye surgeon in a few weeks per his report.  Spoke with patient about discharge planning.  He is going to ask family to come Monday am for education.  Patient used restroom at end of session.  Left up in recliner with safety belt in place and engaged and call bell/ personal items in reach.    Therapy Documentation Precautions:  Precautions Precautions: Fall Restrictions Weight Bearing Restrictions Per Provider Order: No   Pain: Pain Assessment Pain Scale: 0-10 Pain Score: 0-No pain     Therapy/Group: Individual  Therapy  Athalia Setterlund M 02/10/2024, 12:11 PM

## 2024-02-10 NOTE — Progress Notes (Signed)
 Speech Language Pathology Daily Session Note  Patient Details  Name: Edwin Dickerson MRN: [id number] Date of Birth: 10/12/1950  Today's Date: 02/10/2024 SLP Individual Time: 9144-9045 SLP Individual Time Calculation (min): 59 min  Short Term Goals: Week 1: SLP Short Term Goal 1 (Week 1): Patient will consume least restrictive diet with use of safe swallowing strategies given min multimodal A SLP Short Term Goal 2 (Week 1): Patient will recall and utilize memory compensatory strategies given min multimodal A SLP Short Term Goal 3 (Week 1): Patient will demonstrate functional problem solving skills given min multimodal A SLP Short Term Goal 4 (Week 1): Patient will verbally communicate wants/needs with min multimodal A  Skilled Therapeutic Interventions:   Pt seen for skilled SLP intervention to address speech and cognitive goals. SLP targeted compensatory word-finding strategies during informal conversation where pt often had communication breakdowns when telling longer stories. Encouraged frequent pauses to recollect thoughts throughout conversation. Cognitive tasks addressed safety awareness, functional problem solving and sequencing. Pt attempted sequenced card game, though required max cues to follow game and was challenged to complete. Game abandoned and switched task to visual safety awareness where pt identified >90% of safety hazards in visual scene; required min cues to provide solutions for >50% of these hazards. Pt left sitting up in chair with call bell in reach and chair alarm activated. Continue SLP PoC.   Pain Pain Assessment Pain Scale: 0-10 Pain Score: 0-No pain  Therapy/Group: Individual Therapy  Edwin Dickerson 02/10/2024, 9:56 AM

## 2024-02-11 ENCOUNTER — Other Ambulatory Visit: Payer: Self-pay

## 2024-02-11 DIAGNOSIS — S065X9S Traumatic subdural hemorrhage with loss of consciousness of unspecified duration, sequela: Secondary | ICD-10-CM | POA: Diagnosis not present

## 2024-02-11 LAB — BASIC METABOLIC PANEL WITH GFR
Anion gap: 10 (ref 5–15)
BUN: 35 mg/dL — ABNORMAL HIGH (ref 8–23)
CO2: 21 mmol/L — ABNORMAL LOW (ref 22–32)
Calcium: 8.6 mg/dL — ABNORMAL LOW (ref 8.9–10.3)
Chloride: 99 mmol/L (ref 98–111)
Creatinine, Ser: 1.29 mg/dL — ABNORMAL HIGH (ref 0.61–1.24)
GFR, Estimated: 58 mL/min — ABNORMAL LOW (ref >60)
Glucose, Bld: 146 mg/dL — ABNORMAL HIGH (ref 70–99)
Potassium: 4.8 mmol/L (ref 3.5–5.1)
Sodium: 130 mmol/L — ABNORMAL LOW (ref 135–145)

## 2024-02-11 MED ORDER — SCOPOLAMINE 1 MG/3DAYS TD PT72
1.0000 | MEDICATED_PATCH | TRANSDERMAL | Status: DC
  Administered 2024-02-11: 1 mg via TRANSDERMAL
  Filled 2024-02-11: qty 1

## 2024-02-11 MED ORDER — SODIUM CHLORIDE 0.9 % BOLUS PEDS
500.0000 mL | Freq: Once | INTRAVENOUS | Status: AC
  Administered 2024-02-11: 500 mL via INTRAVENOUS

## 2024-02-11 NOTE — Progress Notes (Signed)
 PROGRESS NOTE   Subjective/Complaints:  No events overnight.  Patient complains of ongoing vertigo secondary to left eye retinal detachment, which is improving slightly but remains very bothersome. Vitals stable; positive orthostatic vitals this a.m. for standing blood pressure 90s over 50s.    02/11/2024    4:03 AM 02/10/2024    7:26 PM 02/10/2024    1:54 PM  Vitals with BMI  Systolic 142 103 94  Diastolic 88 71 77  Pulse 51 62 65    P.o. intakes appropriate  Continent of bladder   ROS: +headache, +congestion- improved, +dizziness/vertigo   Objective:   No results found. No results for input(s): WBC, HGB, HCT, PLT in the last 72 hours.  No results for input(s): NA, K, CL, CO2, GLUCOSE, BUN, CREATININE, CALCIUM  in the last 72 hours.   Intake/Output Summary (Last 24 hours) at 02/11/2024 1056 Last data filed at 02/11/2024 0900 Gross per 24 hour  Intake 1170 ml  Output --  Net 1170 ml        Physical Exam: Vital Signs Blood pressure (!) 142/88, pulse (!) 51, temperature 97.8 F (36.6 C), temperature source Oral, resp. rate 16, height 5' 7 (1.702 m), weight 91.2 kg, SpO2 99%. Gen: no distress, normal appearing.  Sitting up in bedside. HEENT: oral mucosa pink and moist, NCAT.  Vision deficit left eye. Cardio: Regular rate and rhythm.  Positive mild systolic murmur. Chest: normal effort, normal rate of breathing Abd: soft, non-distended Ext: no edema Psych: pleasant, normal affect Skin: intact Motor strength is 5/5 bilateral deltoid bicep tricep grip as well as hip flexor knee extensor ankle dorsiflexor Sensation intact light touch bilateral upper and lower limbs Cerebellar normal finger-nose-finger testing bilateral upper limbs Finger to thumb opposition intact bilateral upper limbs + Mild nystagmus on leftward gaze.  Musculoskeletal: Normal shoulder elbow wrist hand hip knee and ankle  range of motion Negative straight leg raising bilaterally, stable 9/12   Assessment/Plan: 1. Functional deficits which require 3+ hours per day of interdisciplinary therapy in a comprehensive inpatient rehab setting. Physiatrist is providing close team supervision and 24 hour management of active medical problems listed below. Physiatrist and rehab team continue to assess barriers to discharge/monitor patient progress toward functional and medical goals  Care Tool:  Bathing    Body parts bathed by patient: Right arm, Left arm, Chest, Abdomen, Front perineal area, Buttocks, Right upper leg, Left upper leg, Right lower leg, Left lower leg, Face         Bathing assist Assist Level: Set up assist     Upper Body Dressing/Undressing Upper body dressing   What is the patient wearing?: Pull over shirt    Upper body assist Assist Level: Independent    Lower Body Dressing/Undressing Lower body dressing      What is the patient wearing?: Underwear/pull up, Pants     Lower body assist Assist for lower body dressing: Set up assist     Toileting Toileting    Toileting assist Assist for toileting: Supervision/Verbal cueing     Transfers Chair/bed transfer  Transfers assist     Chair/bed transfer assist level: Supervision/Verbal cueing     Locomotion Ambulation  Ambulation assist      Assist level: Supervision/Verbal cueing Assistive device: No Device Max distance: >561ft   Walk 10 feet activity   Assist     Assist level: Supervision/Verbal cueing Assistive device: No Device   Walk 50 feet activity   Assist    Assist level: Supervision/Verbal cueing Assistive device: No Device    Walk 150 feet activity   Assist    Assist level: Supervision/Verbal cueing Assistive device: No Device    Walk 10 feet on uneven surface  activity   Assist     Assist level: Minimal Assistance - Patient > 75% Assistive device: Other (comment) (no device)    Wheelchair     Assist Is the patient using a wheelchair?: No             Wheelchair 50 feet with 2 turns activity    Assist            Wheelchair 150 feet activity     Assist          Blood pressure (!) 142/88, pulse (!) 51, temperature 97.8 F (36.6 C), temperature source Oral, resp. rate 16, height 5' 7 (1.702 m), weight 91.2 kg, SpO2 99%.  Medical Problem List and Plan: 1. 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  Status post left frontal temporal parietal craniotomy evacuation of SDH 02/01/2024 per Dr. Alm Dickerson.  JP drain removed 02/03/2024.  Decadron  taper as indicated             -patient may  shower with shower cap             -ELOS/Goals: 10-12d Supervision   Grounds pass ordered  Chart and therapy notes reviewed, continue CIR, discussed patient's progress with therapy Grounds pass ordered Vitamin D3/Metanx/Vitamin B/C complex ordered F/u with me or Edwin Dickerson in clinic within 1 month Would benefit from home health aide upon discharge   2.  Antithrombotics: -DVT/anticoagulation:  Mechanical: Antiembolism stockings, thigh (TED hose) Bilateral lower extremities             -antiplatelet therapy: N/A  3. Pain Management: d/c norco  4. Mood/Behavior/Sleep: continue Klonopin  0.5 mg nightly             -antipsychotic agents: N/A  5. Neuropsych/cognition: This patient is not capable of making decisions on his own behalf.  6. Congestion: guaifenesin  ordered  7. Fluids/Electrolytes/Nutrition: Routine and analysis with follow-up chemistries 8.  Seizure prophylaxis: completed 1 week course: d/c keppra   9.  Hyperlipidemia: continue Lipitor  10.  Hypertension: continue Cozaar  50 mg daily.  Monitor with increased mobility, magnesium  supplement started  11.  Left detached retina.  Recent eye surgery.  Follow-up outpatient ophthalmology Dr. Rankin Dickerson [phone]  12.  Class I obesity.  BMI 31.32.  Dietary follow-up  13.  Constipation.  Senokot  1 tablet twice daily, decrease colace to daily, asked nursing to bring him prune juice, magnesium  supplement started, last BM 9/11   - Last bowel movement 9-13, small  14.  BPH: continue proscar , messaged nursing regarding PVRs  - Continent, no PVRs on file  15. Bradycardia: medications reviewed and he is not on any rate lowering agents  16.  Orthostatic hypotension.  Add teds/binders when out of bed.  Give 500 cc normal saline bolus today.  Check BMP/CBC  17.  Hyperglycemia.  Last A1c on file 7 years ago.  Complicated by current use of Decadron , appears to be no longer on blood glucose checks.  Will get A1c with  next labs.  18.  Vertigo.  States occurred after detached retina, failed meclizine in the past.  Remains very bothersome, will start scopolamine  patch.   LOS: 5 days A FACE TO FACE EVALUATION WAS PERFORMED  Edwin Dickerson Likes 02/11/2024, 10:56 AM

## 2024-02-11 NOTE — Progress Notes (Signed)
 Physical Therapy Session Note  Patient Details  Name: Edwin Dickerson MRN: [id number] Date of Birth: 1950-11-28  Today's Date: 02/11/2024 PT Individual Time: 1000-1044 + 1300-1345 PT Individual Time Calculation (min): 44 min  + 45 min  Short Term Goals: Week 1:  PT Short Term Goal 1 (Week 1): STG=LTG 2/2 ELOS  Skilled Therapeutic Interventions/Progress Updates:       1st session: Pt sitting up in recliner - agreeable to therapy treatment. Reports mild lower back pain, which he relates to constipation. Patient assisted to the toilet at end of treatment for him to try and have BM.   Pt sit<>stand to rollator at supervision level. He ambulates from his room, downstairs and outside [facility] to address unlevel gait training. Pt ambulates while outdoors at supervision level with the rollator. Min cues for keeping body closer to rollator and general safety cues/awareness while navigating unfamiliar environment. Distances > 779ft while outdoors. Also practiced ambulating on grass and mulch surfaces, with vs without the rollator. Supervision with rollator, CGA with no AD.   Pt reports mild Swimmy headed feeling. Returned upstairs to [employer] floor and checked orthostatics.  BP sitting:128/90  BP standing: 95/76 *Notified team via secure chat. Asked for orders for compression socks, if appropriate.   Pt continent of small BM once on toilet at end of session - he asks for stool softener - RN notified. Pt ended session seated in recliner with his needs met.    2nd session: Pt sitting EOB to start - MD present for rounding.   Pt denies any pain. Sit<>stand to rollator with supervision. He ambulates at supervision level ~153ft to day room gym. At times, he kicks the rollator with his R foot due to external rotation, which he reports is baseline.   BP checked:  Sitting: 104/72 Standing: 99/59  BP re-checked with application of knee-high compression socks & abdominal binder:  Sitting:  117/74 Standing: 107/67  Pt instructed in seated Nustep with BLE/BUE for 12 minutes to work on general strengthening and activity tolerance. Covered 638 steps and 0.4 miles in total.   Pt returned to his room and he was left sitting up in the wheelchair. His needs were met. Pt aware of upcoming OT treatment.       Therapy Documentation Precautions:  Precautions Precautions: Fall Restrictions Weight Bearing Restrictions Per Provider Order: No General:     Therapy/Group: Individual Therapy  Sherlean SHAUNNA Perks 02/11/2024, 7:34 AM

## 2024-02-11 NOTE — Progress Notes (Signed)
 Orthopedic Tech Progress Note Patient Details:  Edwin Dickerson 08-09-1950 [id number]  Ortho Devices Type of Ortho Device: Abdominal binder Ortho Device/Splint Interventions: Ordered, Application, Adjustment   Post Interventions Patient Tolerated: Well Instructions Provided: Adjustment of device, Care of device, Poper ambulation with device  Morna Pink 02/11/2024, 11:20 AM

## 2024-02-11 NOTE — Progress Notes (Signed)
 Occupational Therapy Session Note  Patient Details  Name: Edwin Dickerson MRN: [id number] Date of Birth: [birth year]  Today's Date: 02/11/2024 OT Individual Time: 1400-1441 OT Individual Time Calculation (min): 41 min    Short Term Goals: Week 1:  OT Short Term Goal 1 (Week 1): STG=LTG mod I overall for ALDs  Skilled Therapeutic Interventions/Progress Updates:    Pt received sitting up with no c/o pain, agreeable to OT session. He completed 100 ft of functional mobility to the therapy gym with the rollator with close (S), min cueing for RW management. Skilled monitoring of vitals throughout session to ensure hemodynamic and cardiorespiratory stability.  BP assessed- 112/67. He then completed 200 ft of functional mobility while holding a weighted laundry basket to address functional activity tolerance and dynamic standing balance for carryover to IADL tasks. CGA provided throughout and pt quite fatigued by the end. Pt required use of rest breaks throughout session for recovery, as well as to support safety and prevent overexertion. During breaks, OT monitored recovery time to assess endurance and response to exertion. BP 92/58 following activity and pt with slight pallor and reporting some dizziness. 2 repetitions completed. Pt completed standing level reciprocal tapping activity with no UE support using a 5 in step 2x30 repetitions. Activity performed to challenge dynamic standing balance and functional activity tolerance to simulate household threshold management and reduce fall risk. Pt required CGA overall. He returned to his room following and was left sitting up with all needs met, family present.   Therapy Documentation Precautions:  Precautions Precautions: Fall Restrictions Weight Bearing Restrictions Per Provider Order: No Therapy/Group: Individual Therapy  Nena VEAR Moats 02/11/2024, 7:53 AM

## 2024-02-11 NOTE — Progress Notes (Signed)
 Speech Language Pathology Daily Session Note  Patient Details  Name: Edwin Dickerson MRN: [id number] Date of Birth: September 25, 1950  Today's Date: 02/11/2024 SLP Individual Time: 0900-1000 SLP Individual Time Calculation (min): 60 min  Short Term Goals: Week 1: SLP Short Term Goal 1 (Week 1): Patient will consume least restrictive diet with use of safe swallowing strategies given min multimodal A SLP Short Term Goal 2 (Week 1): Patient will recall and utilize memory compensatory strategies given min multimodal A SLP Short Term Goal 3 (Week 1): Patient will demonstrate functional problem solving skills given min multimodal A SLP Short Term Goal 4 (Week 1): Patient will verbally communicate wants/needs with min multimodal A  Skilled Therapeutic Interventions:   Pt greeted at bedside. He was very pleasant/cooperative throughout tx tasks targeting cognition and communication. SLP facilitated object description task targeting sentence formulation and mildly specific word finding. Task was modified from it's original intention given pt's inability to not say the words prior to providing the description. However, he did require only s cues for specific word finding when providing descriptions. He also completed a verbal task adding one item to an (unidentified) abstract category. He benefited from supervisionA for convergent and generative naming. In conversational task, he was able to recall recent medical events as well as progress thus far @ CIR. Only intermittent anomic errors and circumlocution noted w/ very functional expression. At the end of tx tasks, he was left in his recliner before PT tx session.   Pain  No pain reported  Therapy/Group: Individual Therapy  Edwin Dickerson 02/11/2024, 8:40 AM

## 2024-02-12 DIAGNOSIS — S065X9S Traumatic subdural hemorrhage with loss of consciousness of unspecified duration, sequela: Secondary | ICD-10-CM | POA: Diagnosis not present

## 2024-02-12 LAB — CBC WITH DIFFERENTIAL/PLATELET
Abs Immature Granulocytes: 0.87 K/uL — ABNORMAL HIGH (ref 0.00–0.07)
Basophils Absolute: 0.1 K/uL (ref 0.0–0.1)
Basophils Relative: 0 %
Eosinophils Absolute: 0 K/uL (ref 0.0–0.5)
Eosinophils Relative: 0 %
HCT: 43 % (ref 39.0–52.0)
Hemoglobin: 15 g/dL (ref 13.0–17.0)
Immature Granulocytes: 5 %
Lymphocytes Relative: 6 %
Lymphs Abs: 1 K/uL (ref 0.7–4.0)
MCH: 30.5 pg (ref 26.0–34.0)
MCHC: 34.9 g/dL (ref 30.0–36.0)
MCV: 87.4 fL (ref 80.0–100.0)
Monocytes Absolute: 1.2 K/uL — ABNORMAL HIGH (ref 0.1–1.0)
Monocytes Relative: 7 %
Neutro Abs: 14.5 K/uL — ABNORMAL HIGH (ref 1.7–7.7)
Neutrophils Relative %: 82 %
Platelets: 296 K/uL (ref 150–400)
RBC: 4.92 MIL/uL (ref 4.22–5.81)
RDW: 12.4 % (ref 11.5–15.5)
WBC: 17.6 K/uL — ABNORMAL HIGH (ref 4.0–10.5)
nRBC: 0 % (ref 0.0–0.2)

## 2024-02-12 MED ORDER — SODIUM CHLORIDE 0.9 % IV SOLN: INTRAVENOUS | Status: AC

## 2024-02-12 NOTE — Progress Notes (Signed)
 PROGRESS NOTE   Subjective/Complaints:  No events overnight.  States he has noticed considerable improvement in his vertigo/dizziness since placement of scopolamine  patch, although it is not totally gone. Blood pressures looking much better today.  Drank a significant amount of fluid yesterday, greater than 1 L.  Heart rate remains borderline bradycardic but stable. Labs yesterday significant for hyponatremia 130, and AKI creatinine 1.29.  Continent of bladder, but urinary frequency overnight.  Denies any dysuria.  ROS: +headache, +congestion- improved, +dizziness/vertigo   Objective:   No results found. No results for input(s): WBC, HGB, HCT, PLT in the last 72 hours.  Recent Labs    02/11/24 1848  NA 130*  K 4.8  CL 99  CO2 21*  GLUCOSE 146*  BUN 35*  CREATININE 1.29*  CALCIUM  8.6*     Intake/Output Summary (Last 24 hours) at 02/12/2024 0956 Last data filed at 02/11/2024 1853 Gross per 24 hour  Intake 590 ml  Output --  Net 590 ml        Physical Exam: Vital Signs Blood pressure 127/81, pulse (!) 53, temperature 98.6 F (37 C), temperature source Oral, resp. rate 16, height 5' 7 (1.702 m), weight 91.2 kg, SpO2 99%. Gen: no distress, normal appearing.  Sitting up in bed with family at bedside. HEENT: oral mucosa pink and moist, NCAT.  Vision deficit left eye. Cardio: Regular rate and rhythm.  Positive mild systolic murmur. Chest: normal effort, normal rate of breathing Abd: soft, non-distended Ext: no edema Psych: pleasant, normal affect Skin: intact Motor strength is 5/5 bilateral deltoid bicep tricep grip as well as hip flexor knee extensor ankle dorsiflexor Sensation intact light touch bilateral upper and lower limbs Cerebellar normal finger-nose-finger testing bilateral upper limbs Finger to thumb opposition intact bilateral upper limbs + Mild nystagmus on leftward  gaze--ongoing  Musculoskeletal: Normal shoulder elbow wrist hand hip knee and ankle range of motion  Physical exam unchanged from the above on reexamination 02/12/24     Assessment/Plan: 1. Functional deficits which require 3+ hours per day of interdisciplinary therapy in a comprehensive inpatient rehab setting. Physiatrist is providing close team supervision and 24 hour management of active medical problems listed below. Physiatrist and rehab team continue to assess barriers to discharge/monitor patient progress toward functional and medical goals  Care Tool:  Bathing    Body parts bathed by patient: Right arm, Left arm, Chest, Abdomen, Front perineal area, Buttocks, Right upper leg, Left upper leg, Right lower leg, Left lower leg, Face         Bathing assist Assist Level: Set up assist     Upper Body Dressing/Undressing Upper body dressing   What is the patient wearing?: Pull over shirt    Upper body assist Assist Level: Independent    Lower Body Dressing/Undressing Lower body dressing      What is the patient wearing?: Underwear/pull up, Pants     Lower body assist Assist for lower body dressing: Set up assist     Toileting Toileting    Toileting assist Assist for toileting: Supervision/Verbal cueing     Transfers Chair/bed transfer  Transfers assist     Chair/bed transfer assist level: Supervision/Verbal cueing  Locomotion Ambulation   Ambulation assist      Assist level: Supervision/Verbal cueing Assistive device: No Device Max distance: >562ft   Walk 10 feet activity   Assist     Assist level: Supervision/Verbal cueing Assistive device: No Device   Walk 50 feet activity   Assist    Assist level: Supervision/Verbal cueing Assistive device: No Device    Walk 150 feet activity   Assist    Assist level: Supervision/Verbal cueing Assistive device: No Device    Walk 10 feet on uneven surface  activity   Assist      Assist level: Minimal Assistance - Patient > 75% Assistive device: Other (comment) (no device)   Wheelchair     Assist Is the patient using a wheelchair?: No             Wheelchair 50 feet with 2 turns activity    Assist            Wheelchair 150 feet activity     Assist          Blood pressure 127/81, pulse (!) 53, temperature 98.6 F (37 C), temperature source Oral, resp. rate 16, height 5' 7 (1.702 m), weight 91.2 kg, SpO2 99%.  Medical Problem List and Plan: 1. 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  Status post left frontal temporal parietal craniotomy evacuation of SDH 02/01/2024 per Dr. Alm Molt.  JP drain removed 02/03/2024.  Decadron  taper as indicated             -patient may  shower with shower cap             -ELOS/Goals: 10-12d Supervision   Grounds pass ordered  Chart and therapy notes reviewed, continue CIR, discussed patient's progress with therapy Grounds pass ordered Vitamin D3/Metanx/Vitamin B/C complex ordered F/u with me or Fidela in clinic within 1 month Would benefit from home health aide upon discharge   2.  Antithrombotics: -DVT/anticoagulation:  Mechanical: Antiembolism stockings, thigh (TED hose) Bilateral lower extremities             -antiplatelet therapy: N/A  3. Pain Management: d/c norco  4. Mood/Behavior/Sleep: continue Klonopin  0.5 mg nightly             -antipsychotic agents: N/A  5. Neuropsych/cognition: This patient is not capable of making decisions on his own behalf.  6. Congestion: guaifenesin  ordered  7. Fluids/Electrolytes/Nutrition: Routine and analysis with follow-up chemistries 8.  Seizure prophylaxis: completed 1 week course: d/c keppra   9.  Hyperlipidemia: continue Lipitor  10.  Hypertension: continue Cozaar  50 mg daily.  Monitor with increased mobility, magnesium  supplement started  11.  Left detached retina.  Recent eye surgery.  Follow-up outpatient ophthalmology Dr. Rankin Burke  [phone]  12.  Class I obesity.  BMI 31.32.  Dietary follow-up  13.  Constipation.  Senokot 1 tablet twice daily, decrease colace to daily, asked nursing to bring him prune juice, magnesium  supplement started, last BM 9/11   - Last bowel movement 9-13, small  14.  BPH: continue proscar , messaged nursing regarding PVRs  - Continent, no PVRs on file  15. Bradycardia: medications reviewed and he is not on any rate lowering agents  16.  Orthostatic hypotension.  Add teds/binders when out of bed.  Give 500 cc normal saline bolus today.  Check BMP/CBC  - 9-14: Did very well with drinking fluids yesterday, did also receive 500 cc IV fluid bolus yesterday.  Blood pressures are more stable.   17.  Hyperglycemia.  Last A1c on file 7 years ago.  Complicated by current use of Decadron , appears to be no longer on blood glucose checks.  Will get A1c with next labs.  18.  Vertigo.  States occurred after detached retina, failed meclizine in the past.  Remains very bothersome, will start scopolamine  patch.  - 9-14: Symptomatic improvement with scopolamine  patch, tolerating well, continue  19.  Leukocytosis.  Prior trending around 12, increased to 17.6 9-14.  Afebrile.  - Urinalysis with reflex to culture ordered given urinary frequency  -Repeat labs in a.m  20.  AKI/hyponatremia.  - Received 500 cc IV fluid 9-13, will give additional 1 L normal saline 9-14  - Patient doing very well with increasing p.o. fluid intakes  - Repeat labs on Monday  LOS: 6 days A FACE TO FACE EVALUATION WAS PERFORMED  Joesph JAYSON Likes 02/12/2024, 9:56 AM

## 2024-02-13 ENCOUNTER — Other Ambulatory Visit (HOSPITAL_COMMUNITY): Payer: Self-pay

## 2024-02-13 DIAGNOSIS — S065X9S Traumatic subdural hemorrhage with loss of consciousness of unspecified duration, sequela: Secondary | ICD-10-CM | POA: Diagnosis not present

## 2024-02-13 LAB — HEMOGLOBIN A1C
Hgb A1c MFr Bld: 5 % (ref 4.8–5.6)
Mean Plasma Glucose: 96.8 mg/dL

## 2024-02-13 LAB — URINALYSIS, W/ REFLEX TO CULTURE (INFECTION SUSPECTED)
Bacteria, UA: NONE SEEN
Bilirubin Urine: NEGATIVE
Glucose, UA: NEGATIVE mg/dL
Hgb urine dipstick: NEGATIVE
Ketones, ur: NEGATIVE mg/dL
Leukocytes,Ua: NEGATIVE
Nitrite: NEGATIVE
Protein, ur: NEGATIVE mg/dL
Specific Gravity, Urine: 1.009 (ref 1.005–1.030)
pH: 5 (ref 5.0–8.0)

## 2024-02-13 LAB — BASIC METABOLIC PANEL WITH GFR
Anion gap: 10 (ref 5–15)
BUN: 29 mg/dL — ABNORMAL HIGH (ref 8–23)
CO2: 20 mmol/L — ABNORMAL LOW (ref 22–32)
Calcium: 8.7 mg/dL — ABNORMAL LOW (ref 8.9–10.3)
Chloride: 104 mmol/L (ref 98–111)
Creatinine, Ser: 1.02 mg/dL (ref 0.61–1.24)
GFR, Estimated: >60 mL/min (ref >60)
Glucose, Bld: 132 mg/dL — ABNORMAL HIGH (ref 70–99)
Potassium: 4.7 mmol/L (ref 3.5–5.1)
Sodium: 134 mmol/L — ABNORMAL LOW (ref 135–145)

## 2024-02-13 MED ORDER — LOSARTAN POTASSIUM 50 MG PO TABS
50.0000 mg | ORAL_TABLET | Freq: Every day | ORAL | 0 refills | Status: DC
  Filled 2024-02-13: qty 30, 30d supply, fill #0

## 2024-02-13 MED ORDER — VITAMIN D3 25 MCG PO TABS
1000.0000 [IU] | ORAL_TABLET | Freq: Every day | ORAL | 0 refills | Status: DC
  Filled 2024-02-13: qty 30, 30d supply, fill #0

## 2024-02-13 MED ORDER — FINASTERIDE 5 MG PO TABS
5.0000 mg | ORAL_TABLET | Freq: Every day | ORAL | 0 refills | Status: AC
  Filled 2024-02-13: qty 30, 30d supply, fill #0

## 2024-02-13 MED ORDER — SCOPOLAMINE 1 MG/3DAYS TD PT72
1.0000 | MEDICATED_PATCH | TRANSDERMAL | 0 refills | Status: DC
  Filled 2024-02-13: qty 10, 30d supply, fill #0

## 2024-02-13 MED ORDER — CLONAZEPAM 0.5 MG PO TABS
0.5000 mg | ORAL_TABLET | Freq: Every day | ORAL | 0 refills | Status: AC
  Filled 2024-02-13: qty 30, 30d supply, fill #0

## 2024-02-13 MED ORDER — MAGNESIUM GLUCONATE 500 (27 MG) MG PO TABS
250.0000 mg | ORAL_TABLET | Freq: Every day | ORAL | 0 refills | Status: DC
  Filled 2024-02-13: qty 30, 60d supply, fill #0

## 2024-02-13 MED ORDER — LOSARTAN POTASSIUM 25 MG PO TABS
25.0000 mg | ORAL_TABLET | Freq: Every day | ORAL | Status: DC
  Administered 2024-02-14: 25 mg via ORAL
  Filled 2024-02-13: qty 1

## 2024-02-13 MED ORDER — ATORVASTATIN CALCIUM 40 MG PO TABS
40.0000 mg | ORAL_TABLET | Freq: Every day | ORAL | 0 refills | Status: AC
Start: 2024-02-13 — End: ?
  Filled 2024-02-13: qty 30, 30d supply, fill #0

## 2024-02-13 MED ORDER — B COMPLEX-C PO TABS
1.0000 | ORAL_TABLET | Freq: Every day | ORAL | 0 refills | Status: AC
  Filled 2024-02-13: qty 30, 30d supply, fill #0

## 2024-02-13 MED ORDER — DOCUSATE SODIUM 100 MG PO CAPS: 100.0000 mg | ORAL_CAPSULE | Freq: Every day | ORAL | Status: AC

## 2024-02-13 NOTE — Plan of Care (Signed)
  Problem: RH Swallowing Goal: LTG Patient will consume least restrictive diet using compensatory strategies with assistance (SLP) Description: LTG:  Patient will consume least restrictive diet using compensatory strategies with assistance (SLP) Outcome: Completed/Met   Problem: RH Expression Communication Goal: LTG Patient will verbally express basic/complex needs(SLP) Description: LTG:  Patient will verbally express basic/complex needs, wants or ideas with cues  (SLP) Outcome: Completed/Met   Problem: RH Problem Solving Goal: LTG Patient will demonstrate problem solving for (SLP) Description: LTG:  Patient will demonstrate problem solving for basic/complex daily situations with cues  (SLP) Outcome: Completed/Met   Problem: RH Memory Goal: LTG Patient will use memory compensatory aids to (SLP) Description: LTG:  Patient will use memory compensatory aids to recall biographical/new, daily complex information with cues (SLP) Outcome: Completed/Met   Problem: RH Awareness Goal: LTG: Patient will demonstrate awareness during functional activites type of (SLP) Description: LTG: Patient will demonstrate awareness during functional activites type of (SLP) Outcome: Completed/Met

## 2024-02-13 NOTE — Progress Notes (Signed)
 Occupational Therapy Discharge Summary  Patient Details  Name: Edwin Dickerson MRN: [id number] Date of Birth: 03-02-1951  Date of Discharge from OT service:February 13, 2024   Patient has met 10 of 10 long term goals due to improved activity tolerance, improved balance, ability to compensate for deficits, functional use of  RIGHT upper and RIGHT lower extremity, improved attention, improved awareness, and improved coordination.  Patient to discharge at overall Modified Independent level.  Patient's care partner is independent to provide the necessary physical and cognitive assistance at discharge.    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  Equipment: No equipment provided  Reasons for discharge: treatment goals met and discharge from hospital  Patient/family agrees with progress made and goals achieved: Yes  OT Discharge Precautions/Restrictions  Precautions Precautions: Fall Recall of Precautions/Restrictions: Intact Pain Pain Assessment Pain Score: 0-No pain ADL ADL Eating: Independent Where Assessed-Eating: Chair Grooming: Independent Where Assessed-Grooming: Standing at sink Upper Body Bathing: Independent Where Assessed-Upper Body Bathing: Shower Lower Body Bathing: Modified independent Where Assessed-Lower Body Bathing: Shower Upper Body Dressing: Independent Where Assessed-Upper Body Dressing: Chair Lower Body Dressing: Modified independent Where Assessed-Lower Body Dressing: Chair Toileting: Independent Where Assessed-Toileting: Teacher, adult education: Restaurant manager, fast food Method: Ship broker: Information systems manager with back, Acupuncturist: Psychologist, counselling Method: Designer, industrial/product:  Grab bars Vision Baseline Vision/History: 1 Wears glasses Patient Visual Report: Blurring of vision Vision Assessment?: Wears glasses for reading Perception  Perception: Within Functional Limits Praxis Praxis: WFL Cognition Cognition Overall Cognitive Status: Impaired/Different from baseline Arousal/Alertness: Awake/alert Orientation Level: Person;Place;Situation Person: Oriented Place: Oriented Situation: Oriented Memory: Appears intact Attention: Selective Sustained Attention: Appears intact Selective Attention: Appears intact Awareness: Impaired Awareness Impairment: Anticipatory impairment Problem Solving: Impaired Problem Solving Impairment: Functional complex Safety/Judgment: Impaired Brief Interview for Mental Status (BIMS) Repetition of Three Words (First Attempt): 3 Temporal Orientation: Year: Correct Temporal Orientation: Month: Accurate within 5 days Temporal Orientation: Day: Correct Recall: Sock: Yes, no cue required Recall: Blue: Yes, after cueing (a color) Recall: Bed: Yes, after cueing (a piece of furniture) BIMS Summary Score: 13 Sensation Sensation Light Touch: Appears Intact Hot/Cold: Appears Intact Proprioception: Appears Intact Stereognosis: Appears Intact Coordination Gross Motor Movements are Fluid and Coordinated: No Fine Motor Movements are Fluid and Coordinated: No Coordination and Movement Description: slightly slow to process and coordinate Motor  Motor Motor: Hemiplegia Motor - Skilled Clinical Observations: Generalized weakness/deconditioning. Delayed reaction time, mild right hemiplegia Motor - Discharge Observations: Delayed reaction time, mild right hemiplegia Mobility  Bed Mobility Bed Mobility: Rolling Right;Rolling Left;Supine to Sit;Sit to Supine Rolling Right: Independent Rolling Left: Independent Supine to Sit: Independent with assistive device Sit to Supine: Independent with assistive device Transfers Sit to  Stand: Independent with assistive device Stand to Sit: Independent with assistive device  Trunk/Postural Assessment  Cervical Assessment Cervical Assessment: Within Functional Limits Thoracic Assessment Thoracic Assessment: Within Functional Limits Lumbar Assessment Lumbar Assessment: Within Functional Limits Postural Control Protective Responses: slightly delayed.  Balance Balance Balance Assessed: Yes Static Sitting Balance Static Sitting - Balance Support: Feet supported Static Sitting - Level of Assistance: 7: Independent Dynamic Sitting Balance Dynamic Sitting - Balance Support: Feet supported;During functional activity Dynamic Sitting - Level of Assistance: 7: Independent Static Standing Balance Static Standing - Balance Support: During functional activity Static Standing - Level  of Assistance: 7: Independent Dynamic Standing Balance Dynamic Standing - Balance Support: Left upper extremity supported;Right upper extremity supported;During functional activity Dynamic Standing - Level of Assistance: 6: Modified independent (Device/Increase time) Extremity/Trunk Assessment RUE Assessment RUE Assessment: Within Functional Limits LUE Assessment LUE Assessment: Within Functional Limits   Fransico Allean HERO 02/13/2024, 12:20 PM

## 2024-02-13 NOTE — Progress Notes (Signed)
 PROGRESS NOTE   Subjective/Complaints: Asks if he will be getting another CT scan before he leaves, discussed we generally do not repeat if stable Vertigo improved  ROS: +headache, +congestion- improved, +dizziness/vertigo- improved   Objective:   No results found. Recent Labs    02/12/24 1035  WBC 17.6*  HGB 15.0  HCT 43.0  PLT 296    Recent Labs    02/11/24 1848 02/13/24 0610  NA 130* 134*  K 4.8 4.7  CL 99 104  CO2 21* 20*  GLUCOSE 146* 132*  BUN 35* 29*  CREATININE 1.29* 1.02  CALCIUM  8.6* 8.7*     Intake/Output Summary (Last 24 hours) at 02/13/2024 1133 Last data filed at 02/13/2024 0800 Gross per 24 hour  Intake 1300 ml  Output --  Net 1300 ml        Physical Exam: Vital Signs Blood pressure 120/65, pulse 67, temperature 97.6 F (36.4 C), temperature source Oral, resp. rate 18, height 5' 7 (1.702 m), weight 91.2 kg, SpO2 97%. Gen: no distress, normal appearing.  Sitting up in bed with family at bedside. HEENT: oral mucosa pink and moist, NCAT.  Vision deficit left eye- improved Cardio: Regular rate and rhythm.  Positive mild systolic murmur. Chest: normal effort, normal rate of breathing Abd: soft, non-distended Ext: no edema Psych: pleasant, normal affect Skin: intact Motor strength is 5/5 bilateral deltoid bicep tricep grip as well as hip flexor knee extensor ankle dorsiflexor Sensation intact light touch bilateral upper and lower limbs Cerebellar normal finger-nose-finger testing bilateral upper limbs Finger to thumb opposition intact bilateral upper limbs + Mild nystagmus on leftward gaze--ongoing  Musculoskeletal: Normal shoulder elbow wrist hand hip knee and ankle range of motion  Physical exam unchanged from the above on reexamination 02/13/24     Assessment/Plan: 1. Functional deficits which require 3+ hours per day of interdisciplinary therapy in a comprehensive inpatient  rehab setting. Physiatrist is providing close team supervision and 24 hour management of active medical problems listed below. Physiatrist and rehab team continue to assess barriers to discharge/monitor patient progress toward functional and medical goals  Care Tool:  Bathing    Body parts bathed by patient: Right arm, Left arm, Chest, Abdomen, Front perineal area, Buttocks, Right upper leg, Left upper leg, Right lower leg, Left lower leg, Face         Bathing assist Assist Level: Set up assist     Upper Body Dressing/Undressing Upper body dressing   What is the patient wearing?: Pull over shirt    Upper body assist Assist Level: Independent    Lower Body Dressing/Undressing Lower body dressing      What is the patient wearing?: Underwear/pull up, Pants     Lower body assist Assist for lower body dressing: Set up assist     Toileting Toileting    Toileting assist Assist for toileting: Supervision/Verbal cueing     Transfers Chair/bed transfer  Transfers assist     Chair/bed transfer assist level: Independent with assistive device     Locomotion Ambulation   Ambulation assist      Assist level: Independent with assistive device Assistive device: Rollator Max distance: >546ft   Walk  10 feet activity   Assist     Assist level: Independent with assistive device Assistive device: Rollator   Walk 50 feet activity   Assist    Assist level: Independent with assistive device Assistive device: Rollator    Walk 150 feet activity   Assist    Assist level: Independent with assistive device Assistive device: Rollator    Walk 10 feet on uneven surface  activity   Assist     Assist level: Independent with assistive device Assistive device: Rollator   Wheelchair     Assist Is the patient using a wheelchair?: No             Wheelchair 50 feet with 2 turns activity    Assist            Wheelchair 150 feet activity      Assist          Blood pressure 120/65, pulse 67, temperature 97.6 F (36.4 C), temperature source Oral, resp. rate 18, height 5' 7 (1.702 m), weight 91.2 kg, SpO2 97%.  Medical Problem List and Plan: 1. 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  Status post left frontal temporal parietal craniotomy evacuation of SDH 02/01/2024 per Dr. Alm Molt.  JP drain removed 02/03/2024.  Decadron  taper as indicated             -patient may  shower with shower cap             -ELOS/Goals: 8d Supervision   Grounds pass ordered  Chart and therapy notes reviewed, continue CIR, discussed patient's progress with therapy Grounds pass ordered Vitamin D3/Metanx/Vitamin B/C complex ordered F/u with me or Fidela in clinic within 1 month Would benefit from home health aide upon discharge  Discussed plan for d/c tomorrow  2.  Antithrombotics: -DVT/anticoagulation:  Mechanical: Antiembolism stockings, thigh (TED hose) Bilateral lower extremities             -antiplatelet therapy: N/A  3. Pain Management: d/c norco  4. Mood/Behavior/Sleep: continue Klonopin  0.5 mg nightly             -antipsychotic agents: N/A  5. Neuropsych/cognition: This patient is not capable of making decisions on his own behalf.  6. Congestion: guaifenesin  ordered, continue  7. Fluids/Electrolytes/Nutrition: Routine and analysis with follow-up chemistries 8.  Seizure prophylaxis: completed 1 week course: d/c keppra   9.  Hyperlipidemia: continue Lipitor  10.  Hypertension: continue Cozaar  50 mg daily.  Monitor with increased mobility, magnesium  supplement started  11.  Left detached retina.  Recent eye surgery.  Follow-up outpatient ophthalmology Dr. Rankin Burke [phone]  12.  Class I obesity.  BMI 31.32.  Dietary follow-up  13.  Constipation.  Senokot 1 tablet twice daily, decrease colace to daily, asked nursing to bring him prune juice, magnesium  supplement started, last BM 9/11   - Last bowel movement 9-13,  small  14.  BPH: continue proscar , messaged nursing regarding PVRs  - Continent, no PVRs on file  15. Bradycardia: medications reviewed and he is not on any rate lowering agents  16.  Orthostatic hypotension.  Add teds/binders when out of bed.  Give 500 cc normal saline bolus today.  Check BMP/CBC  - 9-14: Did very well with drinking fluids yesterday, did also receive 500 cc IV fluid bolus yesterday.  Blood pressures are more stable.   17.  Hyperglycemia.  Last A1c on file 7 years ago.  Complicated by current use of Decadron , appears to be no longer on blood  glucose checks.  Will get A1c with next labs.  18.  Vertigo.  States occurred after detached retina, failed meclizine in the past.  Remains very bothersome, will start scopolamine  patch.  - 9-14: Symptomatic improvement with scopolamine  patch, tolerating well, continue  19.  Leukocytosis.  Prior trending around 12, increased to 17.6 9-14.  Afebrile.  UA reviewed and is negative  20.  AKI/hyponatremia.  Cr reviewed and has improved  LOS: 7 days A FACE TO FACE EVALUATION WAS PERFORMED  Sven SQUIBB Brittain Smithey 02/13/2024, 11:33 AM

## 2024-02-13 NOTE — Progress Notes (Signed)
 Occupational Therapy Session Note  Patient Details  Name: Edwin Dickerson MRN: [id number] Date of Birth: 01-11-1951  Today's Date: 02/13/2024 OT Individual Time: 1100-1153 OT Individual Time Calculation (min): 53 min    Short Term Goals: Week 1:  OT Short Term Goal 1 (Week 1): STG=LTG mod I overall for ALDs  Skilled Therapeutic Interventions/Progress Updates:    Patient received sitting in recliner.  Multiple family present.  Patient's wife, daughter, son and daughter in law.  Patient indicated need to void prior to leaving the room.  Able to walk to bathroom and complete all aspects of toileting without assistance.  Family educated on impairments that remain - depth perception, endurance, balance as related to BADL.  Patient and family walked to ADL apartment to demonstrate tub/shower transfer with grab bar and shower seat.  Problem solved which shower at home would make the most sense - patient has showers in chicken barns with lower thresholds.  Wife walked back to room beside patient.  Allowed time for all questions to be asked and answered.  Multiple questions regarding returning to work.  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  Patient left up in recliner with safety belt off - family at bedside.    Therapy Documentation Precautions:  Precautions Precautions: Fall Recall of Precautions/Restrictions: Intact Restrictions Weight Bearing Restrictions Per Provider Order: No   Pain: Pain Assessment Pain Score: 0-No pain   Therapy/Group: Individual Therapy  Javari Bufkin M 02/13/2024, 12:05 PM

## 2024-02-13 NOTE — Progress Notes (Signed)
 Physical Therapy Discharge Summary  Patient Details  Name: Edwin Dickerson MRN: [id number] Date of Birth: 1951-02-03  Date of Discharge from PT service:February 13, 2024  Today's Date: 02/13/2024 PT Individual Time: 1300-1410 PT Individual Time Calculation (min): 70 min    Patient has met 10 of 10 long term goals due to improved activity tolerance, improved balance, improved postural control, increased strength, ability to compensate for deficits, improved attention, improved awareness, and improved coordination.  Patient to discharge at an ambulatory level Modified Independent.   Patient's care partner is independent to provide the necessary physical and cognitive assistance at discharge.  Reasons goals not met: n/a  Recommendation:  Patient will benefit from ongoing skilled PT services in outpatient setting to continue to advance safe functional mobility, address ongoing impairments in generalized weakness and deconditioning, balance, safety, and minimize fall risk.  Equipment: No equipment provided. Pt owns a rollator  Reasons for discharge: treatment goals met and discharge from hospital  Patient/family agrees with progress made and goals achieved: Yes  PT Discharge Precautions/Restrictions Precautions Precautions: Fall Restrictions Weight Bearing Restrictions Per Provider Order: No Pain Interference Pain Interference Pain Effect on Sleep: 0. Does not apply - I have not had any pain or hurting in the past 5 days Pain Interference with Therapy Activities: 0. Does not apply - I have not received rehabilitationtherapy in the past 5 days Pain Interference with Day-to-Day Activities: 1. Rarely or not at all Vision/Perception  Vision - History Ability to See in Adequate Light: 1 Impaired Perception Perception: Within Functional Limits Praxis Praxis: WFL  Cognition Overall Cognitive Status: Impaired/Different from baseline Arousal/Alertness: Awake/alert Orientation Level:  Oriented X4 Year: 2025 Month: September Day of Week: Correct Attention: Selective Sustained Attention: Appears intact Selective Attention: Appears intact Memory: Appears intact Awareness: Impaired Awareness Impairment: Anticipatory impairment Problem Solving: Impaired Problem Solving Impairment: Functional complex Safety/Judgment: Impaired Sensation Sensation Light Touch: Appears Intact Hot/Cold: Appears Intact Proprioception: Appears Intact Stereognosis: Appears Intact Coordination Gross Motor Movements are Fluid and Coordinated: Yes Fine Motor Movements are Fluid and Coordinated: Yes Coordination and Movement Description: slightly slow to process and coordinate Motor  Motor Motor: Other (comment) Motor - Skilled Clinical Observations: Generalized weakness/deconditioning. Delayed reaction time  Mobility Bed Mobility Bed Mobility: Rolling Right;Rolling Left;Supine to Sit;Sit to Supine Rolling Right: Independent Rolling Left: Independent Supine to Sit: Independent with assistive device Sit to Supine: Independent with assistive device Transfers Transfers: Sit to Stand;Stand to Sit;Stand Pivot Transfers Sit to Stand: Independent with assistive device Stand to Sit: Independent with assistive device Stand Pivot Transfers: Independent with assistive device Transfer (Assistive device): Rollator Locomotion  Gait Ambulation: Yes Gait Assistance: Independent with assistive device Gait Distance (Feet): 500 Feet Assistive device: Rollator Gait Gait: Yes Gait Pattern: Within Functional Limits Gait Pattern:  (R foot externally rotated, baseline) Stairs / Additional Locomotion Stairs: Yes Stairs Assistance: Independent with assistive device Stair Management Technique: Two rails;Step to pattern;Forwards Number of Stairs: 12 Height of Stairs: 6 Pick up small object from the floor assist level: Independent with assistive device Wheelchair Mobility Wheelchair Mobility: No   Trunk/Postural Assessment  Cervical Assessment Cervical Assessment: Within Functional Limits Thoracic Assessment Thoracic Assessment: Within Functional Limits Lumbar Assessment Lumbar Assessment: Within Functional Limits Postural Control Postural Control: Deficits on evaluation Protective Responses: slightly delayed.  Balance Balance Balance Assessed: Yes Static Sitting Balance Static Sitting - Balance Support: Feet supported Static Sitting - Level of Assistance: 7: Independent Dynamic Sitting Balance Dynamic Sitting - Balance Support: Feet supported;During functional activity Dynamic Sitting -  Level of Assistance: 7: Independent Static Standing Balance Static Standing - Balance Support: During functional activity Static Standing - Level of Assistance: 7: Independent Dynamic Standing Balance Dynamic Standing - Balance Support: Left upper extremity supported;Right upper extremity supported;During functional activity Dynamic Standing - Level of Assistance: 6: Modified independent (Device/Increase time)  Extremity Assessment  RUE Assessment RUE Assessment: Within Functional Limits LUE Assessment LUE Assessment: Within Functional Limits RLE Assessment RLE Assessment: Within Functional Limits LLE Assessment LLE Assessment: Within Functional Limits   Treatment: Family present at the bedside. Pt sitting up in recliner to start - agreeable to therapy treatment. He has no reports of pain. Discussed with family patient's progress in therapy, PT goals, PT POC, DME rec's, fall prevention, home safety, primary deficits from SDH, precautions to take at home, signs/symptoms that could be concerning.   BP sitting: 102/60 HR 67 BP standing: 80/53 HR 78 *MD notified via secure chat. Pt reports 5/10 woozy  BP sitting with compression socks & abdominal binder: 106/65 HR 64 BP standing with compression socks & abdominal binder: 83/58 *MD notified  Proceeding therapy treatment as  tolerated, monitoring symptoms.   Sit<>stand mod I to rollator from recliner. Ambulates mod I level using the rollator from his room the ortho gym, ~257ft. R foot externally rotated and kicking walker frame at times. Car transfer completed mod I - able to manage LE in/out of vehicle without external assist.  Pt completed x15 minutes of seated Nustep at L5 resistance using pace partner program with SPM set to 80 - pt able to sustain and maintain for duration of activity using BUE/BLE to target whole body strengthening and endurance training.  Finished session with stair training - pt able to navigate x12 steps mod I level with 2 hand rails or with 1 hand rail. Completes with a reciprocal stepping pattern for both directions. No LOB, appropriate balance.   Session concluded with patient on the toilet, attempting to have BM. Family in room - pt aware to use pull cord when finished for staff assistance. All needs met at end.   Cliffton Spradley P Jeanie Mccard PT 02/13/2024, 8:01 AM

## 2024-02-13 NOTE — Progress Notes (Signed)
 Patient ID: Edwin Dickerson, male   DOB: 06-08-1950, 74 y.o.   MRN: [id number]  OP PT/OT referral sent to [facility] in [place]

## 2024-02-13 NOTE — Progress Notes (Signed)
 Speech Language Pathology Discharge Summary  Patient Details  Name: Edwin Dickerson MRN: [id number] Date of Birth: 1951-04-22  Date of Discharge from SLP service:February 13, 2024  Today's Date: 02/13/2024 SLP Individual Time: 1001-1100 SLP Individual Time Calculation (min): 59 min   Skilled Therapeutic Interventions:   Skilled therapy session focused on family education and cognitive goals. SLP educated patients family on patients current cognitive, communicative and swallowing function. SLP reviewed patients ST goals and activities completed throught IPR stay. SLP recommended patient continue to complete simple cognitive tasks at d/c including medicaiton management and use of memory strategies. SLP targeted cognitive and communication goals through re-evaluation of cognitive linguistic skills. Patient with significant improvements in communication and problem solving, though occasionally benefits from extra processing time. Patient and family verbalized understanding of all education provided with no further questions. Patient left in bed with alarm set and call bell in reach. Continue POC.     Patient has met 5 of 5 long term goals.  Patient to discharge at overall Supervision level.  Reasons goals not met: n/a   Clinical Impression/Discharge Summary:  Pt has made great gains and has met 5 of 5 LTG's this admission due to improved cognition, communication and dysphagia. Pt is currently an overall supervisionA for cognitive and communication tasks and requires supervision cues for utilization of swallowing compensatory strategies to minimize overt s/sx of aspiration with reg/thin diet. Pt/family education complete and pt will discharge home with supervision from friends/family/etc. No f/u ST services required at this time.   Care Partner:  Caregiver Able to Provide Assistance: Yes  Type of Caregiver Assistance: Physical;Cognitive  Recommendation:  None      Equipment: n/a   Reasons  for discharge: Treatment goals met;Discharged from hospital   Patient/Family Agrees with Progress Made and Goals Achieved: Yes   Rosielee Corporan M.A., CCC-SLP 02/13/2024, 10:53 AM

## 2024-02-13 NOTE — Progress Notes (Signed)
 Inpatient Rehabilitation Discharge Medication Review by a Pharmacist  A complete drug regimen review was completed for this patient to identify any potential clinically significant medication issues.  High Risk Drug Classes Is patient taking? Indication by Medication  Antipsychotic No   Anticoagulant No   Antibiotic No   Opioid No   Antiplatelet No   Hypoglycemics/insulin  No   Vasoactive Medication Yes Losartan : HTN  Chemotherapy No   Other Yes Tylenol : pain Lipitor: HLD Docusate: constipation Scopolamine : vertigo Finasteride : BPH Clonazepam : anxiety/sleep vitB/C, vitD, magnesium : supplements/vitamins Zyrtec: allergies     Type of Medication Issue Identified Description of Issue Recommendation(s)  Drug Interaction(s) (clinically significant)     Duplicate Therapy     Allergy     No Medication Administration End Date     Incorrect Dose     Additional Drug Therapy Needed     Significant med changes from prior encounter (inform family/care partners about these prior to discharge).  Restart or discontinue as appropriate. Communicate medication changes with patient/family at discharge  Other       Clinically significant medication issues were identified that warrant physician communication and completion of prescribed/recommended actions by midnight of the next day:  No  Time spent performing this drug regimen review (minutes): 30  Thank you for allowing pharmacy to be a part of this patient's care.   Bascom JAYSON Louder, PharmD 02/13/2024 7:57 AM   **Pharmacist phone directory can be found on [email or web address] listed under [facility]**

## 2024-02-13 NOTE — Plan of Care (Signed)
  Problem: RH Balance Goal: LTG Patient will maintain dynamic sitting balance (PT) Description: LTG:  Patient will maintain dynamic sitting balance with assistance during mobility activities (PT) Outcome: Completed/Met Goal: LTG Patient will maintain dynamic standing balance (PT) Description: LTG:  Patient will maintain dynamic standing balance with assistance during mobility activities (PT) Outcome: Completed/Met   Problem: Sit to Stand Goal: LTG:  Patient will perform sit to stand with assistance level (PT) Description: LTG:  Patient will perform sit to stand with assistance level (PT) Outcome: Completed/Met   Problem: RH Bed Mobility Goal: LTG Patient will perform bed mobility with assist (PT) Description: LTG: Patient will perform bed mobility with assistance, with/without cues (PT). Outcome: Completed/Met   Problem: RH Bed to Chair Transfers Goal: LTG Patient will perform bed/chair transfers w/assist (PT) Description: LTG: Patient will perform bed to chair transfers with assistance (PT). Outcome: Completed/Met   Problem: RH Car Transfers Goal: LTG Patient will perform car transfers with assist (PT) Description: LTG: Patient will perform car transfers with assistance (PT). Outcome: Completed/Met   Problem: RH Furniture Transfers Goal: LTG Patient will perform furniture transfers w/assist (OT/PT) Description: LTG: Patient will perform furniture transfers  with assistance (OT/PT). Outcome: Completed/Met   Problem: RH Ambulation Goal: LTG Patient will ambulate in controlled environment (PT) Description: LTG: Patient will ambulate in a controlled environment, # of feet with assistance (PT). Outcome: Completed/Met Goal: LTG Patient will ambulate in home environment (PT) Description: LTG: Patient will ambulate in home environment, # of feet with assistance (PT). Outcome: Completed/Met   Problem: RH Stairs Goal: LTG Patient will ambulate up and down stairs w/assist  (PT) Description: LTG: Patient will ambulate up and down # of stairs with assistance (PT) Outcome: Completed/Met   

## 2024-02-14 ENCOUNTER — Other Ambulatory Visit (HOSPITAL_COMMUNITY): Payer: Self-pay

## 2024-02-14 DIAGNOSIS — S065X9S Traumatic subdural hemorrhage with loss of consciousness of unspecified duration, sequela: Secondary | ICD-10-CM | POA: Diagnosis not present

## 2024-02-14 MED ORDER — LOSARTAN POTASSIUM 25 MG PO TABS
25.0000 mg | ORAL_TABLET | Freq: Every day | ORAL | 0 refills | Status: DC
  Filled 2024-02-14: qty 30, 30d supply, fill #0

## 2024-02-14 NOTE — Progress Notes (Signed)
 PROGRESS NOTE   Subjective/Complaints: Placed orders for staples and suture removal today BP reviewed and stable, discussed decrease in losartan   ROS: +headache, +congestion- improved, +dizziness/vertigo- improved, +orthostasis yesterday   Objective:   No results found. Recent Labs    02/12/24 1035  WBC 17.6*  HGB 15.0  HCT 43.0  PLT 296    Recent Labs    02/11/24 1848 02/13/24 0610  NA 130* 134*  K 4.8 4.7  CL 99 104  CO2 21* 20*  GLUCOSE 146* 132*  BUN 35* 29*  CREATININE 1.29* 1.02  CALCIUM  8.6* 8.7*     Intake/Output Summary (Last 24 hours) at 02/14/2024 0926 Last data filed at 02/14/2024 0737 Gross per 24 hour  Intake 517 ml  Output --  Net 517 ml        Physical Exam: Vital Signs Blood pressure 138/86, pulse (!) 52, temperature 98 F (36.7 C), resp. rate 16, height 5' 7 (1.702 m), weight 88.4 kg, SpO2 96%. Gen: no distress, normal appearing.  Sitting up in bed with family at bedside. HEENT: oral mucosa pink and moist, NCAT.  Vision deficit left eye- improved Cardio: Bradycardia.  Positive mild systolic murmur. Chest: normal effort, normal rate of breathing Abd: soft, non-distended Ext: no edema Psych: pleasant, normal affect Skin: intact Motor strength is 5/5 bilateral deltoid bicep tricep grip as well as hip flexor knee extensor ankle dorsiflexor Sensation intact light touch bilateral upper and lower limbs Cerebellar normal finger-nose-finger testing bilateral upper limbs Finger to thumb opposition intact bilateral upper limbs + Mild nystagmus on leftward gaze--ongoing  Musculoskeletal: Normal shoulder elbow wrist hand hip knee and ankle range of motion  Physical exam unchanged from the above on reexamination 02/14/24     Assessment/Plan: 1. Functional deficits which require 3+ hours per day of interdisciplinary therapy in a comprehensive inpatient rehab setting. Physiatrist is  providing close team supervision and 24 hour management of active medical problems listed below. Physiatrist and rehab team continue to assess barriers to discharge/monitor patient progress toward functional and medical goals  Care Tool:  Bathing    Body parts bathed by patient: Right arm, Left arm, Chest, Abdomen, Front perineal area, Buttocks, Right upper leg, Left upper leg, Right lower leg, Left lower leg, Face         Bathing assist Assist Level: Independent with assistive device Assistive Device Comment: grab bar   Upper Body Dressing/Undressing Upper body dressing   What is the patient wearing?: Pull over shirt    Upper body assist Assist Level: Independent    Lower Body Dressing/Undressing Lower body dressing      What is the patient wearing?: Underwear/pull up, Pants     Lower body assist Assist for lower body dressing: Independent with assitive device     Toileting Toileting    Toileting assist Assist for toileting: Independent     Transfers Chair/bed transfer  Transfers assist     Chair/bed transfer assist level: Independent     Locomotion Ambulation   Ambulation assist      Assist level: Independent with assistive device Assistive device: Rollator Max distance: >521ft   Walk 10 feet activity   Assist  Assist level: Independent with assistive device Assistive device: Rollator   Walk 50 feet activity   Assist    Assist level: Independent with assistive device Assistive device: Rollator    Walk 150 feet activity   Assist    Assist level: Independent with assistive device Assistive device: Rollator    Walk 10 feet on uneven surface  activity   Assist     Assist level: Independent with assistive device Assistive device: Rollator   Wheelchair     Assist Is the patient using a wheelchair?: No             Wheelchair 50 feet with 2 turns activity    Assist            Wheelchair 150 feet  activity     Assist          Blood pressure 138/86, pulse (!) 52, temperature 98 F (36.7 C), resp. rate 16, height 5' 7 (1.702 m), weight 88.4 kg, SpO2 96%.  Medical Problem List and Plan: 1. 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  Status post left frontal temporal parietal craniotomy evacuation of SDH 02/01/2024 per Dr. Alm Molt.  JP drain removed 02/03/2024.  Decadron  taper as indicated             -patient may  shower with shower cap             -ELOS/Goals: 8d Supervision   Grounds pass ordered  Chart and therapy notes reviewed, d/c home Grounds pass ordered Vitamin D3/Metanx/Vitamin B/C complex ordered F/u with me or Fidela in clinic within 1 month Would benefit from home health aide upon discharge  -placed order for suture and staples removal  2.  Antithrombotics: -DVT/anticoagulation:  Mechanical: Antiembolism stockings, thigh (TED hose) Bilateral lower extremities             -antiplatelet therapy: N/A  3. Pain Management: d/c norco  4. Mood/Behavior/Sleep: continue Klonopin  0.5 mg nightly             -antipsychotic agents: N/A  5. Neuropsych/cognition: This patient is not capable of making decisions on his own behalf.  6. Congestion: guaifenesin  ordered, continue  7. Fluids/Electrolytes/Nutrition: Routine and analysis with follow-up chemistries 8.  Seizure prophylaxis: completed 1 week course: d/c keppra   9.  Hyperlipidemia: continue Lipitor  10.  Hypertension: decreased cozaar  to 35mg  daily.  Monitor with increased mobility, magnesium  supplement started  11.  Left detached retina.  Recent eye surgery.  Follow-up outpatient ophthalmology Dr. Rankin Burke [phone]  12.  Class I obesity.  BMI 31.32.  Dietary follow-up  13.  Constipation.  Senokot 1 tablet twice daily, decrease colace to daily, asked nursing to bring him prune juice, magnesium  supplement started, last BM 9/11   - Last bowel movement 9-13, small  14.  BPH: continue proscar , messaged  nursing regarding PVRs  - Continent, no PVRs on file  15. Bradycardia: medications reviewed and he is not on any rate lowering agents  16.  Orthostatic hypotension.  Add teds/binders when out of bed.  Give 500 cc normal saline bolus today.  Check BMP/CBC  - 9-14: Did very well with drinking fluids yesterday, did also receive 500 cc IV fluid bolus yesterday.  Blood pressures are more stable.   17.  Hyperglycemia.  Last A1c on file 7 years ago.  Complicated by current use of Decadron , appears to be no longer on blood glucose checks.  Will get A1c with next labs.  18.  Vertigo.  States  occurred after detached retina, failed meclizine in the past.  Remains very bothersome, will start scopolamine  patch.  - 9-14: Symptomatic improvement with scopolamine  patch, tolerating well, continue  19.  Leukocytosis.  Prior trending around 12, increased to 17.6 9-14.  Afebrile.  UA reviewed and is negative  20.  AKI/hyponatremia.  Cr reviewed and has improved   >30 minutes spent in discharge of patient including review of medications and follow-up appointments, physical examination, and in answering all patient's questions   LOS: 8 days A FACE TO FACE EVALUATION WAS PERFORMED  Sven P Rockney Grenz 02/14/2024, 9:26 AM

## 2024-02-14 NOTE — Progress Notes (Signed)
 INPATIENT REHABILITATION DISCHARGE NOTE   Discharge instructions by: Rolan, PA  Verbalized understanding: Yes  Skin care/Wound care healing? Yes. Staples removed  Pain: none  IV's: removed  Tubes/Drains: none  O2: none  Safety instructions: reviewed with pt  Patient belongings: sent with pt.  Discharged to: home   Discharged via: family transport  Notes: done  Geralynn LELON Earl, Charity fundraiser

## 2024-02-14 NOTE — Progress Notes (Signed)
 Inpatient Rehabilitation Care Coordinator Discharge Note   Patient Details  Name: Edwin Dickerson MRN: [id number] Date of Birth: Apr 30, 1951   Discharge location: Home  Length of Stay: 7 days  Discharge activity level: Independent with assistive device  Home/community participation: Active in the community  Patient response un:Yzjouy Literacy - How often do you need to have someone help you when you read instructions, pamphlets, or other written material from your doctor or pharmacy?: Sometimes  Patient response un:Dnrpjo Isolation - How often do you feel lonely or isolated from those around you?: Never  Services provided included: MD, RD, OT, PT, SLP, RN, CM, TR, Pharmacy, SW  Financial Services:  Financial Services Utilized: Media planner MEDICARE / MEDICARE PART A AND B  Choices offered to/list presented to: Patient/family  Follow-up services arranged:  Outpatient    Outpatient Servicies: PT/OT at [facility] in [place]      Patient response to transportation need: Is the patient able to respond to transportation needs?: Yes In the past 12 months, has lack of transportation kept you from medical appointments or from getting medications?: No In the past 12 months, has lack of transportation kept you from meetings, work, or from getting things needed for daily living?: No   Patient/Family verbalized understanding of follow-up arrangements:  Yes  Individual responsible for coordination of the follow-up plan: Patient  Confirmed correct DME delivered: Edwin  Dickerson 02/14/2024    Comments (or additional information):Patient's family participated in family education and are able to provide 24/7 care needs.  Summary of Stay    Date/Time Discharge Planning CSW  02/08/24 [phone] Plans to return home with wife, Edwin Dickerson. He will recieve assistance and support from his children as needed. Awaiting therapy DME and follow up recommendations. DS       Edwin  Dickerson

## 2024-02-15 ENCOUNTER — Emergency Department (HOSPITAL_COMMUNITY)

## 2024-02-15 ENCOUNTER — Inpatient Hospital Stay (HOSPITAL_COMMUNITY)
Admission: EM | Admit: 2024-02-15 | Discharge: 2024-02-19 | DRG: 025 | Disposition: A | Discharge status: Home / Self Care | Attending: Family Medicine | Admitting: Family Medicine

## 2024-02-15 ENCOUNTER — Other Ambulatory Visit: Payer: Self-pay

## 2024-02-15 ENCOUNTER — Encounter (HOSPITAL_COMMUNITY): Payer: Self-pay | Admitting: Emergency Medicine

## 2024-02-15 DIAGNOSIS — S06A0XS Traumatic brain compression without herniation, sequela: Secondary | ICD-10-CM

## 2024-02-15 DIAGNOSIS — D72829 Elevated white blood cell count, unspecified: Secondary | ICD-10-CM | POA: Diagnosis present

## 2024-02-15 DIAGNOSIS — N400 Enlarged prostate without lower urinary tract symptoms: Secondary | ICD-10-CM | POA: Diagnosis present

## 2024-02-15 DIAGNOSIS — R42 Dizziness and giddiness: Secondary | ICD-10-CM | POA: Diagnosis not present

## 2024-02-15 DIAGNOSIS — N179 Acute kidney failure, unspecified: Secondary | ICD-10-CM | POA: Diagnosis present

## 2024-02-15 DIAGNOSIS — R4781 Slurred speech: Secondary | ICD-10-CM | POA: Diagnosis present

## 2024-02-15 DIAGNOSIS — E7800 Pure hypercholesterolemia, unspecified: Secondary | ICD-10-CM | POA: Diagnosis present

## 2024-02-15 DIAGNOSIS — Z79899 Other long term (current) drug therapy: Secondary | ICD-10-CM

## 2024-02-15 DIAGNOSIS — Z880 Allergy status to penicillin: Secondary | ICD-10-CM

## 2024-02-15 DIAGNOSIS — Z9181 History of falling: Secondary | ICD-10-CM

## 2024-02-15 DIAGNOSIS — Z6831 Body mass index (BMI) 31.0-31.9, adult: Secondary | ICD-10-CM

## 2024-02-15 DIAGNOSIS — Z8673 Personal history of transient ischemic attack (TIA), and cerebral infarction without residual deficits: Secondary | ICD-10-CM

## 2024-02-15 DIAGNOSIS — E66811 Obesity, class 1: Secondary | ICD-10-CM | POA: Diagnosis present

## 2024-02-15 DIAGNOSIS — S065XAA Traumatic subdural hemorrhage with loss of consciousness status unknown, initial encounter: Principal | ICD-10-CM | POA: Diagnosis present

## 2024-02-15 DIAGNOSIS — S065XAS Traumatic subdural hemorrhage with loss of consciousness status unknown, sequela: Principal | ICD-10-CM

## 2024-02-15 DIAGNOSIS — R748 Abnormal levels of other serum enzymes: Secondary | ICD-10-CM | POA: Diagnosis present

## 2024-02-15 DIAGNOSIS — E871 Hypo-osmolality and hyponatremia: Secondary | ICD-10-CM | POA: Diagnosis present

## 2024-02-15 DIAGNOSIS — E860 Dehydration: Secondary | ICD-10-CM | POA: Diagnosis present

## 2024-02-15 DIAGNOSIS — I951 Orthostatic hypotension: Secondary | ICD-10-CM | POA: Diagnosis not present

## 2024-02-15 DIAGNOSIS — Q2730 Arteriovenous malformation, site unspecified: Secondary | ICD-10-CM

## 2024-02-15 DIAGNOSIS — S06A0XA Traumatic brain compression without herniation, initial encounter: Secondary | ICD-10-CM | POA: Diagnosis present

## 2024-02-15 DIAGNOSIS — K567 Ileus, unspecified: Secondary | ICD-10-CM | POA: Diagnosis not present

## 2024-02-15 LAB — CBC WITH DIFFERENTIAL/PLATELET
Basophils Absolute: 0 K/uL (ref 0.0–0.1)
Basophils Relative: 0 %
Eosinophils Absolute: 0 K/uL (ref 0.0–0.5)
Eosinophils Relative: 0 %
HCT: 43.2 % (ref 39.0–52.0)
Hemoglobin: 14.7 g/dL (ref 13.0–17.0)
Lymphocytes Relative: 1 %
Lymphs Abs: 0.2 K/uL — ABNORMAL LOW (ref 0.7–4.0)
MCH: 30.8 pg (ref 26.0–34.0)
MCHC: 34 g/dL (ref 30.0–36.0)
MCV: 90.6 fL (ref 80.0–100.0)
Monocytes Absolute: 0.4 K/uL (ref 0.1–1.0)
Monocytes Relative: 2 %
Neutro Abs: 17.3 K/uL — ABNORMAL HIGH (ref 1.7–7.7)
Neutrophils Relative %: 97 %
Platelets: 302 K/uL (ref 150–400)
RBC: 4.77 MIL/uL (ref 4.22–5.81)
RDW: 12.5 % (ref 11.5–15.5)
WBC: 17.8 K/uL — ABNORMAL HIGH (ref 4.0–10.5)
nRBC: 0 % (ref 0.0–0.2)

## 2024-02-15 LAB — I-STAT CHEM 8, ED
BUN: 33 mg/dL — ABNORMAL HIGH (ref 8–23)
Calcium, Ion: 1.06 mmol/L — ABNORMAL LOW (ref 1.15–1.40)
Chloride: 100 mmol/L (ref 98–111)
Creatinine, Ser: 1.5 mg/dL — ABNORMAL HIGH (ref 0.61–1.24)
Glucose, Bld: 91 mg/dL (ref 70–99)
HCT: 41 % (ref 39.0–52.0)
Hemoglobin: 13.9 g/dL (ref 13.0–17.0)
Potassium: 3.8 mmol/L (ref 3.5–5.1)
Sodium: 133 mmol/L — ABNORMAL LOW (ref 135–145)
TCO2: 22 mmol/L (ref 22–32)

## 2024-02-15 LAB — COMPREHENSIVE METABOLIC PANEL WITH GFR
ALT: 63 U/L — ABNORMAL HIGH (ref 0–44)
AST: 36 U/L (ref 15–41)
Albumin: 3 g/dL — ABNORMAL LOW (ref 3.5–5.0)
Alkaline Phosphatase: 80 U/L (ref 38–126)
Anion gap: 12 (ref 5–15)
BUN: 29 mg/dL — ABNORMAL HIGH (ref 8–23)
CO2: 20 mmol/L — ABNORMAL LOW (ref 22–32)
Calcium: 7.9 mg/dL — ABNORMAL LOW (ref 8.9–10.3)
Chloride: 100 mmol/L (ref 98–111)
Creatinine, Ser: 1.39 mg/dL — ABNORMAL HIGH (ref 0.61–1.24)
GFR, Estimated: 53 mL/min — ABNORMAL LOW (ref >60)
Glucose, Bld: 90 mg/dL (ref 70–99)
Potassium: 3.8 mmol/L (ref 3.5–5.1)
Sodium: 132 mmol/L — ABNORMAL LOW (ref 135–145)
Total Bilirubin: 1.8 mg/dL — ABNORMAL HIGH (ref 0.0–1.2)
Total Protein: 5.7 g/dL — ABNORMAL LOW (ref 6.5–8.1)

## 2024-02-15 LAB — URINALYSIS, ROUTINE W REFLEX MICROSCOPIC
Bilirubin Urine: NEGATIVE
Glucose, UA: NEGATIVE mg/dL
Hgb urine dipstick: NEGATIVE
Ketones, ur: NEGATIVE mg/dL
Leukocytes,Ua: NEGATIVE
Nitrite: NEGATIVE
Protein, ur: NEGATIVE mg/dL
Specific Gravity, Urine: 1.005 (ref 1.005–1.030)
pH: 6 (ref 5.0–8.0)

## 2024-02-15 LAB — GLUCOSE, CAPILLARY: Glucose-Capillary: 86 mg/dL (ref 70–99)

## 2024-02-15 MED ORDER — ACETAMINOPHEN 325 MG PO TABS
650.0000 mg | ORAL_TABLET | Freq: Once | ORAL | Status: AC | PRN
  Administered 2024-02-15: 650 mg via ORAL
  Filled 2024-02-15: qty 2

## 2024-02-15 MED ORDER — FINASTERIDE 5 MG PO TABS
5.0000 mg | ORAL_TABLET | Freq: Every day | ORAL | Status: DC
  Administered 2024-02-16 – 2024-02-19 (×3): 5 mg via ORAL
  Filled 2024-02-15 (×4): qty 1

## 2024-02-15 MED ORDER — SODIUM CHLORIDE 0.9 % IV BOLUS
1000.0000 mL | Freq: Once | INTRAVENOUS | Status: AC
  Administered 2024-02-15: 1000 mL via INTRAVENOUS

## 2024-02-15 MED ORDER — SODIUM CHLORIDE 0.9 % IV SOLN: INTRAVENOUS | Status: AC

## 2024-02-15 MED ORDER — LORATADINE 10 MG PO TABS: 10.0000 mg | ORAL_TABLET | Freq: Every day | ORAL | Status: DC | PRN

## 2024-02-15 MED ORDER — ATORVASTATIN CALCIUM 40 MG PO TABS
40.0000 mg | ORAL_TABLET | Freq: Every day | ORAL | Status: DC
  Administered 2024-02-16 – 2024-02-18 (×3): 40 mg via ORAL
  Filled 2024-02-15 (×3): qty 1

## 2024-02-15 NOTE — ED Provider Notes (Addendum)
 [place] EMERGENCY DEPARTMENT AT [facility] Provider Note   CSN: [id number] Arrival date & time: 02/15/24  1657     Patient presents with: No chief complaint on file.   Edwin Dickerson is a 73 y.o. male.   Patient here with dizziness.  Just discharged yesterday after suffering a subdural hematoma where he had evacuation.  He was in rehab here back to ambulatory without much assistance.  He is back home.  He was having some dizzy episodes when he stood up.  Sounds like he was having issues like that while he was in rehab as well.  Sounds like he was having orthostatic hypotension.  When EMS arrived they checked his blood pressure and blood pressure was 80/60.  With liter bolus back up to 110/89.  He is asymptomatic with me here.  Not have any chest pain shortness of breath weakness numbness tingling.  He denies any fever or chills.  Denies any pain with urination.  Denies any new falls.  The history is provided by the patient and a caregiver.       Prior to Admission medications   Medication Sig Start Date End Date Taking? Authorizing Provider  acetaminophen  (TYLENOL ) 325 MG tablet Take 2 tablets (650 mg total) by mouth every 4 (four) hours as needed for mild pain (pain score 1-3) (temp > 100.5). 02/07/24  Yes Angiulli, Toribio PARAS, PA-C  atorvastatin  (LIPITOR) 40 MG tablet Take 1 tablet (40 mg total) by mouth daily at 6 PM. 02/13/24  Yes Angiulli, Toribio PARAS, PA-C  B Complex-C (B-COMPLEX WITH VITAMIN C) tablet Take 1 tablet by mouth daily. 02/13/24  Yes Angiulli, Toribio PARAS, PA-C  cetirizine (ZYRTEC) 10 MG tablet Take 10 mg by mouth daily as needed for allergies.   Yes [provider]  clonazePAM  (KLONOPIN ) 0.5 MG tablet Take 1 tablet (0.5 mg total) by mouth at bedtime. 02/13/24  Yes Angiulli, Toribio PARAS, PA-C  docusate sodium  (COLACE) 100 MG capsule Take 1 capsule (100 mg total) by mouth daily. 02/13/24  Yes Angiulli, Toribio PARAS, PA-C  finasteride  (PROSCAR ) 5 MG tablet Take 1 tablet  (5 mg total) by mouth daily. 02/13/24  Yes Angiulli, Toribio PARAS, PA-C  losartan  (COZAAR ) 25 MG tablet Take 1 tablet (25 mg total) by mouth daily. 02/14/24  Yes Angiulli, Toribio PARAS, PA-C  magnesium  gluconate (MAGONATE) 500 (27 Mg) MG TABS tablet Take 0.5 tablets (250 mg total) by mouth daily. 02/13/24  Yes Angiulli, Toribio PARAS, PA-C  scopolamine  (TRANSDERM-SCOP) 1 MG/3DAYS Place 1 patch (1 mg total) onto the skin every 3 (three) days. Patient taking differently: Place 1 patch onto the skin every 3 (three) days. Apply patch on T-Th-Sat. 02/14/24  Yes Angiulli, Toribio PARAS, PA-C  vitamin D3 (CHOLECALCIFEROL ) 25 MCG tablet Take 1 tablet (1,000 Units total) by mouth daily. 02/13/24  Yes Angiulli, Toribio PARAS, PA-C    Allergies: Penicillins    Review of Systems  Updated Vital Signs BP 125/67   Pulse 87   Temp 99.1 F (37.3 C) (Rectal)   Resp 17   SpO2 99%   Physical Exam Vitals and nursing note reviewed.  Constitutional:      General: He is not in acute distress.    Appearance: He is well-developed.  HENT:     Head: Normocephalic and atraumatic.     Mouth/Throat:     Mouth: Mucous membranes are moist.  Eyes:     Extraocular Movements: Extraocular movements intact.     Conjunctiva/sclera: Conjunctivae normal.  Comments: Left pupil minimally reactive compared to the right, (pt states somewhat baseline due to surgery in eye), normal visual fields  Cardiovascular:     Rate and Rhythm: Normal rate and regular rhythm.     Pulses: Normal pulses.     Heart sounds: Normal heart sounds. No murmur heard. Pulmonary:     Effort: Pulmonary effort is normal. No respiratory distress.     Breath sounds: Normal breath sounds.  Abdominal:     Palpations: Abdomen is soft.     Tenderness: There is no abdominal tenderness.  Musculoskeletal:        General: No swelling.     Cervical back: Neck supple.  Skin:    General: Skin is warm and dry.     Capillary Refill: Capillary refill takes less than 2 seconds.   Neurological:     General: No focal deficit present.     Mental Status: He is alert.     Comments: 5+ out of 5 strength throughout, normal sensation, no drift, normal finger-to-nose finger, normal speech  Psychiatric:        Mood and Affect: Mood normal.     (all labs ordered are listed, but only abnormal results are displayed) Labs Reviewed  CBC WITH DIFFERENTIAL/PLATELET - Abnormal; Notable for the following components:      Result Value   WBC 17.8 (*)    Neutro Abs 17.3 (*)    Lymphs Abs 0.2 (*)    All other components within normal limits  COMPREHENSIVE METABOLIC PANEL WITH GFR - Abnormal; Notable for the following components:   Sodium 132 (*)    CO2 20 (*)    BUN 29 (*)    Creatinine, Ser 1.39 (*)    Calcium  7.9 (*)    Total Protein 5.7 (*)    Albumin 3.0 (*)    ALT 63 (*)    Total Bilirubin 1.8 (*)    GFR, Estimated 53 (*)    All other components within normal limits  I-STAT CHEM 8, ED - Abnormal; Notable for the following components:   Sodium 133 (*)    BUN 33 (*)    Creatinine, Ser 1.50 (*)    Calcium , Ion 1.06 (*)    All other components within normal limits  URINALYSIS, ROUTINE W REFLEX MICROSCOPIC    EKG: EKG Interpretation Date/Time:  Wednesday February 15 2024 17:07:38 EDT Ventricular Rate:  71 PR Interval:  175 QRS Duration:  124 QT Interval:  419 QTC Calculation: 456 R Axis:   -48  Text Interpretation: Sinus rhythm Nonspecific IVCD with LAD LVH with secondary repolarization abnormality Confirmed by Ruthe Cornet [phone]) on 02/15/2024 5:46:42 PM  Radiology: CT Head Wo Contrast Result Date: 02/15/2024 EXAM: CT HEAD WITHOUT CONTRAST 02/15/2024 05:44:00 PM TECHNIQUE: CT of the head was performed without the administration of intravenous contrast. Automated exposure control, iterative reconstruction, and/or weight based adjustment of the mA/kV was utilized to reduce the radiation dose to as low as reasonably achievable. COMPARISON: CT head 02/03/2024.  CLINICAL HISTORY: Headache, increasing frequency or severity. Dizziness. Pt BIB [facility] from home due to dizziness. Pt was discharged yesterday from here after shunt placement on top of his head; pt had fall two weeks ago. Pt initial BP 80/60; after 1L bolus; 110/89. FINDINGS: BRAIN AND VENTRICLES: Postsurgical changes of left frontal burr hole and craniotomy for evacuation of underlying subdural hematoma. There is a mixed attenuation subdural collection overlying the left cerebral convexity, which now measures up to 1.9 cm in  maximum thickness on coronal images. The collection previously measured up to 1.6 cm on the prior study when measuring in a similar manner. Regions of hyperattenuating blood products are significantly decreased compared to prior. There is primarily isoattenuating to slightly hypoattenuating appearance of the subdural collection. Overall the subdural hematoma appears slightly increased overlying the left parietal lobe but is otherwise similar to slightly decreased over the left frontal lobe. There is approximately 5 mm rightward midline shift previously measuring 4 mm. The caliber of the lateral ventricles appears slightly improved compared to prior. There is trace subdural hemorrhage along the posterior falx and left tentorium. Previously noted subarachnoid hemorrhage, particularly along the left sylvian fissure, is nearly completely resolved. Significant interval decrease in pneumocephalus. ORBITS: Bilateral lens replacement. SINUSES: No acute abnormality. SOFT TISSUES AND SKULL: There is slightly decreased soft tissue swelling and soft tissue gas at the surgical site in the left frontal scalp. IMPRESSION: 1. Mixed attenuation subdural collection overlying the left cerebral convexity, measuring up to 1.9 cm in maximum thickness, previously 1.6 cm measuring in a similar manner. Associated 5 mm rightward midline shift. The collection has slightly increased over the left parietal lobe and  is otherwise similar to slightly decreased over the left frontal lobe compared to prior study. 2. Nearly complete resolution of previously noted subarachnoid hemorrhage. 3. Slightly improved caliber of the lateral ventricles compared to prior study. Electronically signed by: Donnice Mania MD 02/15/2024 06:26 PM EDT RP Workstation: [id number]   DG Chest Portable 1 View Result Date: 02/15/2024 CLINICAL DATA:  weakness EXAM: PORTABLE CHEST 1 VIEW COMPARISON:  Chest x-ray 04/06/2020 FINDINGS: The heart and mediastinal contours are unchanged. Atherosclerotic plaque. No focal consolidation. No pulmonary edema. No pleural effusion. No pneumothorax. No acute osseous abnormality. IMPRESSION: 1. No active disease. 2.  Aortic Atherosclerosis (ICD10-I70.0). Electronically Signed   By: Morgane  Naveau M.D.   On: 02/15/2024 18:00     Procedures   Medications Ordered in the ED  sodium chloride  0.9 % bolus 1,000 mL (0 mLs Intravenous Stopped 02/15/24 1945)                                    Medical Decision Making Amount and/or Complexity of Data Reviewed Labs: ordered. Radiology: ordered.  Risk Decision regarding hospitalization.   Edwin Dickerson is here with dizziness intermittent today.  Recently discharged after subdural hematoma evacuation and treatment.  He has been ambulatory without much assistance since being in rehab.  Went home yesterday.  Sounds like after talking with family he was having some orthostatic hypotension episodes while he was in rehab.  May be possible similar episodes today.  Sounds like when he would stand up he get a little dizzy and then it would pass.  Does not have any dizziness now.  No weakness numbness tingling.  No speech changes or vision changes.  EMS found his blood pressure to be 80/60 gave him a fluid bolus and he went up to 110/89.  Orthostatics with me in the room are somewhat positive as blood pressure was around 120 systolic and when he stood up got to 91 systolic.  he was little bit dizzy when he first stood up but symptoms subsided.  He denies any fever or chills.  Denies any black or bloody stools.  Neurologically he seems to be intact.  I do suspect may be dehydration/orthostatic process.  Seems less likely to be stroke or other acute  process.  EKG shows sinus rhythm.  No ischemic changes.  Will check basic labs give further fluid bolus get head CT check urinalysis and chest x-ray.  Will reevaluate.Left pupil sluggish on exam but surgery to eye in the past, patient states hx of same.  Per my review and interpretation of labs creatinine is mildly elevated at 1.5.  White count 17 but stable.  No significant anemia.  Chest x-ray negative for pneumonia.  Urine analysis negative for infection.  Albumin to little bit low.  Head CT overall stable.  He has mixed attenuation subdural collection overlying the left cerebral convexity measuring up to 1.9 cm in thickness.  Measuring about slightly higher than prior but otherwise he has had decrease swelling over the left frontal lobe.  Ultimately orthostatics seen positive in the room with me.  He still little bit unsteady and I think would be reasonable to admit him for hydration supportive care.  I have reached out to neurosurgery team Duwaine Beck to review head CT as well.  Overall Dr. Mavis with neurosurgery called me back.  They will observe the patient in the morning given findings on his head CT.  Continue neurochecks overnight.  Overall he stable throughout my care.  This chart was dictated using voice recognition software.  Despite best efforts to proofread,  errors can occur which can change the documentation meaning.      Final diagnoses:  Dizziness  Orthostatic dizziness  SDH (subdural hematoma) [facility])    ED Discharge Orders     None          Ruthe Cornet, DO 02/15/24 1906    Ruthe Cornet, DO 02/15/24 1952    Ruthe Cornet, DO 02/15/24 2037

## 2024-02-15 NOTE — ED Triage Notes (Signed)
 Pt BIB [facility] from home due to dizziness. Pt was discharged yesterday from here after shunt placement on top of his head; pt had fall two weeks ago.  Pt initial BP 80/60; after 1L bolus; 110/89. 18g bilateral AC.

## 2024-02-15 NOTE — ED Notes (Signed)
 3W given courtesy call.

## 2024-02-15 NOTE — H&P (Signed)
 History and Physical    Edwin Dickerson FMW:[id number] DOB: 11-17-1950 DOA: 02/15/2024  PCP: Marelyn Axon, MD  Patient coming from: Home  Chief Complaint: Dizziness  HPI: Edwin Dickerson is a 73 y.o. male with medical history significant of  hyperlipidemia, TIA, BPH, class I obesity, left eye retinal detachment status post eye surgery.  Recent subdural hematoma status post craniotomy 02/01/2024 discharged to CIR and finally returned home yesterday.  Patient presents to the ED today via EMS from home for evaluation of dizziness and hypotension.  Blood pressure 80/60 with EMS and was given 1 L IV fluids with improvement of blood pressure to 110/89.  In the ED, orthostatics noted to be positive as his SBP dropped from 124 supine to 91 immediately upon standing and patient felt dizzy.  Afebrile.  Not tachycardic, tachypneic, or hypoxic.  Labs notable for WBC count 17.8, hemoglobin 14.7, platelet count 302k, sodium 132, bicarb 20, BUN 29, creatinine 1.4 (baseline 1.0), calcium  7.9, albumin 3.0, ALT 63, T. bili 1.8, AST and alk phos normal, UA not suggestive of infection.  EKG showing sinus rhythm and no acute ischemic changes.  Chest x-ray showing no active disease.  CT head showing mixed attenuation subdural collection overlying the left cerebral convexity measuring up to 1.9 cm in maximum thickness, previously 1.6 cm measuring in a similar manner.  Associated 5 mm rightward midline shift.  The collection has slightly increased over the left parietal lobe and is otherwise similar to slightly decreased over the left frontal lobe compared to prior study.  Showing nearly complete resolution of previously noted subarachnoid hemorrhage.  In addition, showing slightly improved caliber of the lateral ventricles compared to prior study.  EDP discussed CT head findings with neurosurgeon Dr. Mavis who recommended admission for observation, continuing neurochecks, and their service will consult in the morning.  Patient was  given 1 L normal saline bolus in the ED.    Patient states he was doing well when he left rehab yesterday.  Today in the morning he was walking inside his house when he started feeling dizzy.  Sometime during the day his wife noticed that his speech was slightly slurred but has since improved and now back to normal.  No upper or lower extremity weakness or paresthesias reported.  He has not had any falls since he left the hospital.  Patient denies headaches.  Reports mild cough since he was in the hospital.  Denies fevers, chest pain, shortness of breath, nausea, vomiting, abdominal pain, or diarrhea.  He does not on antiplatelet agents or anticoagulation.  Reports history of left eye problems for which he has undergone surgery twice.  Most recently underwent surgery in mid June of this year for left eye retinal detachment.  Review of Systems:  Review of Systems  All other systems reviewed and are negative.   Past Medical History:  Diagnosis Date   High cholesterol    TIA (transient ischemic attack)     Past Surgical History:  Procedure Laterality Date   CRANIOTOMY Left 02/01/2024   Procedure: CRANIOTOMY HEMATOMA EVACUATION SUBDURAL;  Surgeon: Joshua Alm Hamilton, MD;  Location: [facility] OR;  Service: Neurosurgery;  Laterality: Left;   PARS PLANA REPAIR OF RETINAL DEATACHMENT       reports that he has never smoked. He has never used smokeless tobacco. He reports that he does not drink alcohol and does not use drugs.  Allergies  Allergen Reactions   Penicillins Hives, Rash and Other (See Comments)    Syncope  History reviewed. No pertinent family history.  Prior to Admission medications   Medication Sig Start Date End Date Taking? Authorizing Provider  acetaminophen  (TYLENOL ) 325 MG tablet Take 2 tablets (650 mg total) by mouth every 4 (four) hours as needed for mild pain (pain score 1-3) (temp > 100.5). 02/07/24  Yes Angiulli, Toribio PARAS, PA-C  atorvastatin  (LIPITOR) 40 MG tablet Take 1  tablet (40 mg total) by mouth daily at 6 PM. 02/13/24  Yes Angiulli, Toribio PARAS, PA-C  B Complex-C (B-COMPLEX WITH VITAMIN C) tablet Take 1 tablet by mouth daily. 02/13/24  Yes Angiulli, Toribio PARAS, PA-C  cetirizine (ZYRTEC) 10 MG tablet Take 10 mg by mouth daily as needed for allergies.   Yes [provider]  clonazePAM  (KLONOPIN ) 0.5 MG tablet Take 1 tablet (0.5 mg total) by mouth at bedtime. 02/13/24  Yes Angiulli, Toribio PARAS, PA-C  docusate sodium  (COLACE) 100 MG capsule Take 1 capsule (100 mg total) by mouth daily. 02/13/24  Yes Angiulli, Toribio PARAS, PA-C  finasteride  (PROSCAR ) 5 MG tablet Take 1 tablet (5 mg total) by mouth daily. 02/13/24  Yes Angiulli, Toribio PARAS, PA-C  losartan  (COZAAR ) 25 MG tablet Take 1 tablet (25 mg total) by mouth daily. 02/14/24  Yes Angiulli, Toribio PARAS, PA-C  magnesium  gluconate (MAGONATE) 500 (27 Mg) MG TABS tablet Take 0.5 tablets (250 mg total) by mouth daily. 02/13/24  Yes Angiulli, Toribio PARAS, PA-C  scopolamine  (TRANSDERM-SCOP) 1 MG/3DAYS Place 1 patch (1 mg total) onto the skin every 3 (three) days. Patient taking differently: Place 1 patch onto the skin every 3 (three) days. Apply patch on T-Th-Sat. 02/14/24  Yes Angiulli, Toribio PARAS, PA-C  vitamin D3 (CHOLECALCIFEROL ) 25 MCG tablet Take 1 tablet (1,000 Units total) by mouth daily. 02/13/24  Yes Pegge Toribio PARAS, PA-C    Physical Exam: Vitals:   02/15/24 1704 02/15/24 1712 02/15/24 1945  BP: 125/69  125/67  Pulse: 74  87  Resp: 19  17  Temp:  99.1 F (37.3 C)   TempSrc:  Rectal   SpO2: 100%  99%    Physical Exam Vitals reviewed.  Constitutional:      General: He is not in acute distress. HENT:     Head: Normocephalic.  Eyes:     Extraocular Movements: Extraocular movements intact.     Comments: Right eye with brisk response to light Left eye pupil with sluggish response to light  Cardiovascular:     Rate and Rhythm: Normal rate and regular rhythm.     Pulses: Normal pulses.  Pulmonary:     Effort:  Pulmonary effort is normal. No respiratory distress.     Breath sounds: Normal breath sounds. No stridor. No wheezing, rhonchi or rales.  Abdominal:     General: Bowel sounds are normal. There is no distension.     Palpations: Abdomen is soft.     Tenderness: There is no abdominal tenderness. There is no guarding.  Musculoskeletal:     Cervical back: Normal range of motion.     Right lower leg: No edema.     Left lower leg: No edema.  Skin:    General: Skin is warm and dry.  Neurological:     General: No focal deficit present.     Mental Status: He is alert and oriented to person, place, and time.     Sensory: No sensory deficit.     Motor: No weakness.     Comments: Speech fluent No facial droop  Labs on Admission: I have personally reviewed following labs and imaging studies  CBC: Recent Labs  Lab 02/12/24 1035 02/15/24 1711 02/15/24 1718  WBC 17.6* 17.8*  --   NEUTROABS 14.5* 17.3*  --   HGB 15.0 14.7 13.9  HCT 43.0 43.2 41.0  MCV 87.4 90.6  --   PLT 296 302  --    Basic Metabolic Panel: Recent Labs  Lab 02/11/24 1848 02/13/24 0610 02/15/24 1711 02/15/24 1718  NA 130* 134* 132* 133*  K 4.8 4.7 3.8 3.8  CL 99 104 100 100  CO2 21* 20* 20*  --   GLUCOSE 146* 132* 90 91  BUN 35* 29* 29* 33*  CREATININE 1.29* 1.02 1.39* 1.50*  CALCIUM  8.6* 8.7* 7.9*  --    GFR: Estimated Creatinine Clearance: 45.1 mL/min (A) (by C-G formula based on SCr of 1.5 mg/dL (H)). Liver Function Tests: Recent Labs  Lab 02/15/24 1711  AST 36  ALT 63*  ALKPHOS 80  BILITOT 1.8*  PROT 5.7*  ALBUMIN 3.0*   No results for input(s): LIPASE, AMYLASE in the last 168 hours. No results for input(s): AMMONIA in the last 168 hours. Coagulation Profile: No results for input(s): INR, PROTIME in the last 168 hours. Cardiac Enzymes: No results for input(s): CKTOTAL, CKMB, CKMBINDEX, TROPONINI in the last 168 hours. BNP (last 3 results) No results for input(s):  PROBNP in the last 8760 hours. HbA1C: Recent Labs    02/13/24 0610  HGBA1C 5.0   CBG: No results for input(s): GLUCAP in the last 168 hours. Lipid Profile: No results for input(s): CHOL, HDL, LDLCALC, TRIG, CHOLHDL, LDLDIRECT in the last 72 hours. Thyroid  Function Tests: No results for input(s): TSH, T4TOTAL, FREET4, T3FREE, THYROIDAB in the last 72 hours. Anemia Panel: No results for input(s): VITAMINB12, FOLATE, FERRITIN, TIBC, IRON, RETICCTPCT in the last 72 hours. Urine analysis:    Component Value Date/Time   COLORURINE YELLOW 02/15/2024 1711   APPEARANCEUR CLEAR 02/15/2024 1711   LABSPEC 1.005 02/15/2024 1711   PHURINE 6.0 02/15/2024 1711   GLUCOSEU NEGATIVE 02/15/2024 1711   HGBUR NEGATIVE 02/15/2024 1711   BILIRUBINUR NEGATIVE 02/15/2024 1711   KETONESUR NEGATIVE 02/15/2024 1711   PROTEINUR NEGATIVE 02/15/2024 1711   NITRITE NEGATIVE 02/15/2024 1711   LEUKOCYTESUR NEGATIVE 02/15/2024 1711    Radiological Exams on Admission: CT Head Wo Contrast Result Date: 02/15/2024 EXAM: CT HEAD WITHOUT CONTRAST 02/15/2024 05:44:00 PM TECHNIQUE: CT of the head was performed without the administration of intravenous contrast. Automated exposure control, iterative reconstruction, and/or weight based adjustment of the mA/kV was utilized to reduce the radiation dose to as low as reasonably achievable. COMPARISON: CT head 02/03/2024. CLINICAL HISTORY: Headache, increasing frequency or severity. Dizziness. Pt BIB [facility] from home due to dizziness. Pt was discharged yesterday from here after shunt placement on top of his head; pt had fall two weeks ago. Pt initial BP 80/60; after 1L bolus; 110/89. FINDINGS: BRAIN AND VENTRICLES: Postsurgical changes of left frontal burr hole and craniotomy for evacuation of underlying subdural hematoma. There is a mixed attenuation subdural collection overlying the left cerebral convexity, which now measures up to 1.9 cm  in maximum thickness on coronal images. The collection previously measured up to 1.6 cm on the prior study when measuring in a similar manner. Regions of hyperattenuating blood products are significantly decreased compared to prior. There is primarily isoattenuating to slightly hypoattenuating appearance of the subdural collection. Overall the subdural hematoma appears slightly increased overlying the left parietal lobe but  is otherwise similar to slightly decreased over the left frontal lobe. There is approximately 5 mm rightward midline shift previously measuring 4 mm. The caliber of the lateral ventricles appears slightly improved compared to prior. There is trace subdural hemorrhage along the posterior falx and left tentorium. Previously noted subarachnoid hemorrhage, particularly along the left sylvian fissure, is nearly completely resolved. Significant interval decrease in pneumocephalus. ORBITS: Bilateral lens replacement. SINUSES: No acute abnormality. SOFT TISSUES AND SKULL: There is slightly decreased soft tissue swelling and soft tissue gas at the surgical site in the left frontal scalp. IMPRESSION: 1. Mixed attenuation subdural collection overlying the left cerebral convexity, measuring up to 1.9 cm in maximum thickness, previously 1.6 cm measuring in a similar manner. Associated 5 mm rightward midline shift. The collection has slightly increased over the left parietal lobe and is otherwise similar to slightly decreased over the left frontal lobe compared to prior study. 2. Nearly complete resolution of previously noted subarachnoid hemorrhage. 3. Slightly improved caliber of the lateral ventricles compared to prior study. Electronically signed by: Donnice Mania MD 02/15/2024 06:26 PM EDT RP Workstation: [id number]   DG Chest Portable 1 View Result Date: 02/15/2024 CLINICAL DATA:  weakness EXAM: PORTABLE CHEST 1 VIEW COMPARISON:  Chest x-ray 04/06/2020 FINDINGS: The heart and mediastinal contours are  unchanged. Atherosclerotic plaque. No focal consolidation. No pulmonary edema. No pleural effusion. No pneumothorax. No acute osseous abnormality. IMPRESSION: 1. No active disease. 2.  Aortic Atherosclerosis (ICD10-I70.0). Electronically Signed   By: Morgane  Naveau M.D.   On: 02/15/2024 18:00    Assessment and Plan  Orthostatic hypotension Hypotension less likely due to ACS as patient is not endorsing chest pain and EKG without acute ischemic changes.  Also PE less likely given no tachycardia or hypoxia.  Hemoglobin stable and no signs of blood loss.  Hold home losartan .  Continue IV fluid hydration and repeat orthostatics in the morning.  Fall precautions.  AKI Likely secondary to dehydration.  BUN 29, creatinine 1.4 (baseline 1.0).  Continue IV fluid hydration and monitor renal function.  Avoid nephrotoxic agents/hold home losartan .  Leukocytosis Chest x-ray not suggestive of pneumonia.  UA not suggestive of infection.  No other obvious infectious source.  No signs of sepsis.  Monitor WBC count.  Mild hyponatremia Continue IV fluid hydration with normal saline and monitor labs.  Mild elevation of liver enzymes ALT 63, T. bili 1.8, AST and alk phos normal.  Patient is not endorsing any GI symptoms.  No abdominal pain or tenderness.  Monitor LFTs.  Recent subdural hematoma status post craniotomy 02/01/2024 CT head showing mixed attenuation subdural collection overlying the left cerebral convexity measuring up to 1.9 cm in maximum thickness, previously 1.6 cm measuring in a similar manner.  Associated 5 mm rightward midline shift.  The collection has slightly increased over the left parietal lobe and is otherwise similar to slightly decreased over the left frontal lobe compared to prior study.  Showing nearly complete resolution of previously noted subarachnoid hemorrhage.  In addition, showing slightly improved caliber of the lateral ventricles compared to prior study.  Patient denies having any  falls since after being discharged from CIR yesterday.  He is not on antiplatelet agents or anticoagulation.  On exam, he has no neurologic deficits except his left pupil has sluggish response to light.  Patient tells me that he has had surgeries done on his left eye twice in the past with most recent surgery in mid June of this year for retinal detachment.  I spoke to ED physician Dr. Ruthe who had examined the patient earlier and he also informed me that he had noticed patient's left pupil having sluggish response to light and had already spoken to neurosurgery about it.  Neurosurgery will consult in the morning.  Continue frequent neurochecks every 2 hours.  Hyperlipidemia History of TIA Continue Lipitor.  BPH Continue finasteride .  DVT prophylaxis: SCDs Code Status: Full Code (discussed with the patient) Family Communication: Patient's wife, daughter, and son at bedside. Consults called: Neurosurgery Level of care: Progressive Care Unit Admission status: It is my clinical opinion that referral for OBSERVATION is reasonable and necessary in this patient based on the above information provided. The aforementioned taken together are felt to place the patient at high risk for further clinical deterioration. However, it is anticipated that the patient may be medically stable for discharge from the hospital within 24 to 48 hours.  Editha Ram MD [facility]  If 7PM-7AM, please contact night-coverage [email or web address]  02/15/2024, 7:54 PM

## 2024-02-15 NOTE — Consult Note (Signed)
 The patient is a 73 year old white male on whom Dr. Joshua performed a left craniotomy for evacuation of subdural hematoma on 02/01/2024.  His head CT was on 02/03/2024 which demonstrated some acute blood in the evacuation cavity.  The patient went to rehab.  By report he was having some hypotension in rehab.  He was discharged yesterday but presented ER with orthostatic hypotension.  He has scan was obtained which demonstrated a left parietal subacute/ chronic subdural hematoma with mild mass effect.  By report the patient is alert and oriented with normal speech and strength.  I reviewed his CT scans.  He has had some reaccumulation of his subdural hematoma with mild mass effect, but it looks better than his scan on 02/01/2024 and 02/03/2024.  He does not need urgent surgery.  Perhaps he would respond to middle meningeal artery embolization.  He is being admitted with treatment of his orthostatic hypotension.  I will inform Dr. Joshua of his readmission and he will likely see him tomorrow.

## 2024-02-16 ENCOUNTER — Observation Stay (HOSPITAL_COMMUNITY)

## 2024-02-16 DIAGNOSIS — I609 Nontraumatic subarachnoid hemorrhage, unspecified: Secondary | ICD-10-CM | POA: Diagnosis not present

## 2024-02-16 DIAGNOSIS — Z9181 History of falling: Secondary | ICD-10-CM | POA: Diagnosis not present

## 2024-02-16 DIAGNOSIS — D72829 Elevated white blood cell count, unspecified: Secondary | ICD-10-CM | POA: Diagnosis present

## 2024-02-16 DIAGNOSIS — N400 Enlarged prostate without lower urinary tract symptoms: Secondary | ICD-10-CM | POA: Diagnosis present

## 2024-02-16 DIAGNOSIS — R42 Dizziness and giddiness: Secondary | ICD-10-CM | POA: Diagnosis present

## 2024-02-16 DIAGNOSIS — S065XAA Traumatic subdural hemorrhage with loss of consciousness status unknown, initial encounter: Secondary | ICD-10-CM | POA: Diagnosis present

## 2024-02-16 DIAGNOSIS — R4781 Slurred speech: Secondary | ICD-10-CM | POA: Diagnosis present

## 2024-02-16 DIAGNOSIS — E7800 Pure hypercholesterolemia, unspecified: Secondary | ICD-10-CM | POA: Diagnosis present

## 2024-02-16 DIAGNOSIS — Z79899 Other long term (current) drug therapy: Secondary | ICD-10-CM | POA: Diagnosis not present

## 2024-02-16 DIAGNOSIS — I10 Essential (primary) hypertension: Secondary | ICD-10-CM | POA: Diagnosis not present

## 2024-02-16 DIAGNOSIS — Z6831 Body mass index (BMI) 31.0-31.9, adult: Secondary | ICD-10-CM | POA: Diagnosis not present

## 2024-02-16 DIAGNOSIS — G459 Transient cerebral ischemic attack, unspecified: Secondary | ICD-10-CM | POA: Diagnosis not present

## 2024-02-16 DIAGNOSIS — N179 Acute kidney failure, unspecified: Secondary | ICD-10-CM | POA: Diagnosis present

## 2024-02-16 DIAGNOSIS — S06A0XS Traumatic brain compression without herniation, sequela: Secondary | ICD-10-CM | POA: Diagnosis not present

## 2024-02-16 DIAGNOSIS — E871 Hypo-osmolality and hyponatremia: Secondary | ICD-10-CM | POA: Diagnosis present

## 2024-02-16 DIAGNOSIS — R748 Abnormal levels of other serum enzymes: Secondary | ICD-10-CM | POA: Diagnosis present

## 2024-02-16 DIAGNOSIS — Z8673 Personal history of transient ischemic attack (TIA), and cerebral infarction without residual deficits: Secondary | ICD-10-CM | POA: Diagnosis not present

## 2024-02-16 DIAGNOSIS — S06A0XA Traumatic brain compression without herniation, initial encounter: Secondary | ICD-10-CM | POA: Diagnosis present

## 2024-02-16 DIAGNOSIS — K567 Ileus, unspecified: Secondary | ICD-10-CM | POA: Diagnosis not present

## 2024-02-16 DIAGNOSIS — E66811 Obesity, class 1: Secondary | ICD-10-CM | POA: Diagnosis present

## 2024-02-16 DIAGNOSIS — I951 Orthostatic hypotension: Secondary | ICD-10-CM

## 2024-02-16 DIAGNOSIS — E860 Dehydration: Secondary | ICD-10-CM | POA: Diagnosis present

## 2024-02-16 DIAGNOSIS — Z880 Allergy status to penicillin: Secondary | ICD-10-CM | POA: Diagnosis not present

## 2024-02-16 LAB — CBC
HCT: 39.8 % (ref 39.0–52.0)
Hemoglobin: 13.7 g/dL (ref 13.0–17.0)
MCH: 30.8 pg (ref 26.0–34.0)
MCHC: 34.4 g/dL (ref 30.0–36.0)
MCV: 89.4 fL (ref 80.0–100.0)
Platelets: 221 K/uL (ref 150–400)
RBC: 4.45 MIL/uL (ref 4.22–5.81)
RDW: 12.6 % (ref 11.5–15.5)
WBC: 12.4 K/uL — ABNORMAL HIGH (ref 4.0–10.5)
nRBC: 0 % (ref 0.0–0.2)

## 2024-02-16 LAB — ECHOCARDIOGRAM COMPLETE
AR max vel: 3.06 cm2
AV Area VTI: 2.99 cm2
AV Area mean vel: 3.07 cm2
AV Mean grad: 5 mmHg
AV Peak grad: 9 mmHg
Ao pk vel: 1.5 m/s
Area-P 1/2: 3.21 cm2
Calc EF: 61.9 %
S' Lateral: 3.1 cm
Single Plane A2C EF: 60.7 %
Single Plane A4C EF: 62.7 %

## 2024-02-16 LAB — COMPREHENSIVE METABOLIC PANEL WITH GFR
ALT: 54 U/L — ABNORMAL HIGH (ref 0–44)
AST: 27 U/L (ref 15–41)
Albumin: 2.9 g/dL — ABNORMAL LOW (ref 3.5–5.0)
Alkaline Phosphatase: 63 U/L (ref 38–126)
Anion gap: 11 (ref 5–15)
BUN: 22 mg/dL (ref 8–23)
CO2: 23 mmol/L (ref 22–32)
Calcium: 8.1 mg/dL — ABNORMAL LOW (ref 8.9–10.3)
Chloride: 102 mmol/L (ref 98–111)
Creatinine, Ser: 1.29 mg/dL — ABNORMAL HIGH (ref 0.61–1.24)
GFR, Estimated: 58 mL/min — ABNORMAL LOW (ref >60)
Glucose, Bld: 88 mg/dL (ref 70–99)
Potassium: 4 mmol/L (ref 3.5–5.1)
Sodium: 136 mmol/L (ref 135–145)
Total Bilirubin: 1.9 mg/dL — ABNORMAL HIGH (ref 0.0–1.2)
Total Protein: 5.1 g/dL — ABNORMAL LOW (ref 6.5–8.1)

## 2024-02-16 LAB — GLUCOSE, CAPILLARY: Glucose-Capillary: 91 mg/dL (ref 70–99)

## 2024-02-16 MED ORDER — LACTATED RINGERS IV BOLUS
1000.0000 mL | Freq: Once | INTRAVENOUS | Status: AC
  Administered 2024-02-16: 1000 mL via INTRAVENOUS

## 2024-02-16 MED ORDER — BISACODYL 10 MG RE SUPP: 10.0000 mg | Freq: Every day | RECTAL | Status: DC | PRN

## 2024-02-16 MED ORDER — POLYETHYLENE GLYCOL 3350 17 G PO PACK
17.0000 g | PACK | Freq: Two times a day (BID) | ORAL | Status: DC
  Administered 2024-02-16 – 2024-02-19 (×5): 17 g via ORAL
  Filled 2024-02-16 (×5): qty 1

## 2024-02-16 MED ORDER — OXYCODONE HCL 5 MG PO TABS
5.0000 mg | ORAL_TABLET | ORAL | Status: DC | PRN
  Administered 2024-02-16 – 2024-02-17 (×5): 5 mg via ORAL
  Filled 2024-02-16 (×5): qty 1

## 2024-02-16 NOTE — Progress Notes (Addendum)
 PROGRESS NOTE    Edwin Dickerson  FMW:[id number] DOB: [birth year] DOA: 02/15/2024 PCP: Marelyn Axon, MD  No chief complaint on file.   Brief Narrative:   Edwin Dickerson is Edwin Dickerson 73 y.o. male with medical history significant of  hyperlipidemia, TIA, BPH, class I obesity, left eye retinal detachment status post eye surgery.  Recent subdural hematoma status post craniotomy 02/01/2024 discharged to CIR and finally returned home yesterday.  Patient presents to the ED today via EMS from home for evaluation of dizziness and hypotension.  Blood pressure 80/60 with EMS and was given 1 L IV fluids with improvement of blood pressure to 110/89.  In the ED, orthostatics noted to be positive as his SBP dropped from 124 supine to 91 immediately upon standing and patient felt dizzy.  Afebrile.  Not tachycardic, tachypneic, or hypoxic.  Labs notable for WBC count 17.8, hemoglobin 14.7, platelet count 302k, sodium 132, bicarb 20, BUN 29, creatinine 1.4 (baseline 1.0), calcium  7.9, albumin 3.0, ALT 63, T. bili 1.8, AST and alk phos normal, UA not suggestive of infection.  EKG showing sinus rhythm and no acute ischemic changes.  Chest x-ray showing no active disease.  CT head showing mixed attenuation subdural collection overlying the left cerebral convexity measuring up to 1.9 cm in maximum thickness, previously 1.6 cm measuring in Edwin Dickerson similar manner.  Associated 5 mm rightward midline shift.  The collection has slightly increased over the left parietal lobe and is otherwise similar to slightly decreased over the left frontal lobe compared to prior study.  Showing nearly complete resolution of previously noted subarachnoid hemorrhage.  In addition, showing slightly improved caliber of the lateral ventricles compared to prior study.  EDP discussed CT head findings with neurosurgeon Dr. Mavis who recommended admission for observation, continuing neurochecks, and their service will consult in the morning.  Patient was given 1 L normal  saline bolus in the ED.     Patient states he was doing well when he left rehab yesterday.  Today in the morning he was walking inside his house when he started feeling dizzy.  Sometime during the day his wife noticed that his speech was slightly slurred but has since improved and now back to normal.  No upper or lower extremity weakness or paresthesias reported.  He has not had any falls since he left the hospital.  Patient denies headaches.  Reports mild cough since he was in the hospital.  Denies fevers, chest pain, shortness of breath, nausea, vomiting, abdominal pain, or diarrhea.  He does not on antiplatelet agents or anticoagulation.  Reports history of left eye problems for which he has undergone surgery twice.  Most recently underwent surgery in mid June of this year for left eye retinal detachment.  Assessment & Plan:   Principal Problem:   Orthostatic hypotension Active Problems:   SDH (subdural hematoma) (HCC)   AKI (acute kidney injury) (HCC)   Leukocytosis   Hyponatremia  Recent subdural hematoma status post craniotomy 02/01/2024 CT head showing mixed attenuation subdural collection overlying the left cerebral convexity measuring up to 1.9 cm in maximum thickness, previously 1.6 cm measuring in Bart Ashford similar manner.  Associated 5 mm rightward midline shift.  The collection has slightly increased over the left parietal lobe and is otherwise similar to slightly decreased over the left frontal lobe compared to prior study.  nearly complete resolution of previously noted subarachnoid hemorrhage.  slightly improved caliber of the lateral ventricles compared to prior study.   Note hx surgeries done on  his left eye twice in the past with most recent surgery in mid June of this year for retinal detachment.   Seen by neurosurgery today, planning for MMA embolization  Orthostatic hypotension Echo pending Given 1 L LR  Follow TSH, cortisol Sounds like this was an issue in CIR Therapy when able     Back Pain  Constipation  Mild Ileus Plain film with possible mild ileus Follow   AKI Likely secondary to dehydration.  BUN 29, creatinine 1.4 (baseline 1.0).  Continue IV fluid hydration and monitor renal function.  Avoid nephrotoxic agents/hold home losartan .   Leukocytosis Chest x-ray not suggestive of pneumonia.  UA not suggestive of infection.  No other obvious infectious source.  No signs of sepsis.  Monitor WBC count.   Mild hyponatremia Continue IV fluid hydration with normal saline and monitor labs.   Mild elevation of liver enzymes Trend   Hyperlipidemia History of TIA Continue Lipitor.   BPH Continue finasteride .    DVT prophylaxis: SCD Code Status: full Family Communication: wife at bedside Disposition:   Status is: Observation The patient will require care spanning > 2 midnights and should be moved to inpatient because: need for continued inpatient care   Consultants:  neurosurgery  Procedures:  none  Antimicrobials:  Anti-infectives (From admission, onward)    None       Subjective: C/o back pain, he suspects he needs to have [name] BM  Objective: Vitals:   02/16/24 0303 02/16/24 0829 02/16/24 1207 02/16/24 1619  BP: (!) 144/93 (!) 161/86 126/73 132/78  Pulse: 75 73 80 79  Resp: 18 16 18 18   Temp: 98.7 F (37.1 C) 98.7 F (37.1 C) 98.9 F (37.2 C) 97.8 F (36.6 C)  TempSrc: Oral Oral Oral Oral  SpO2: 98% 97% 97% 97%    Intake/Output Summary (Last 24 hours) at 02/16/2024 1930 Last data filed at 02/16/2024 1344 Gross per 24 hour  Intake 763.11 ml  Output 2225 ml  Net -1461.89 ml   There were no vitals filed for this visit.  Examination:  General exam: Appears calm and comfortable  Respiratory system: unlabored Cardiovascular system: RRR Gastrointestinal system: Abdomen is nondistended, soft and nontender.  Central nervous system: Alert and oriented. No focal neurological deficits. Extremities:  no LEE    Data Reviewed: I  have personally reviewed following labs and imaging studies  CBC: Recent Labs  Lab 02/12/24 1035 02/15/24 1711 02/15/24 1718 02/16/24 0436  WBC 17.6* 17.8*  --  12.4*  NEUTROABS 14.5* 17.3*  --   --   HGB 15.0 14.7 13.9 13.7  HCT 43.0 43.2 41.0 39.8  MCV 87.4 90.6  --  89.4  PLT 296 302  --  221    Basic Metabolic Panel: Recent Labs  Lab 02/11/24 1848 02/13/24 0610 02/15/24 1711 02/15/24 1718 02/16/24 0436  NA 130* 134* 132* 133* 136  K 4.8 4.7 3.8 3.8 4.0  CL 99 104 100 100 102  CO2 21* 20* 20*  --  23  GLUCOSE 146* 132* 90 91 88  BUN 35* 29* 29* 33* 22  CREATININE 1.29* 1.02 1.39* 1.50* 1.29*  CALCIUM  8.6* 8.7* 7.9*  --  8.1*    GFR: Estimated Creatinine Clearance: 52.5 mL/min ([place]ney [place]) (by C-G formula based on SCr of 1.29 mg/dL (H)).  Liver Function Tests: Recent Labs  Lab 02/15/24 1711 02/16/24 0436  AST 36 27  ALT 63* 54*  ALKPHOS 80 63  BILITOT 1.8* 1.9*  PROT 5.7* 5.1*  ALBUMIN 3.0*  2.9*    CBG: Recent Labs  Lab 02/15/24 2119 02/16/24 0826  GLUCAP 86 91     No results found for this or any previous visit (from the past 240 hours).       Radiology Studies: DG Abd 1 View Result Date: 02/16/2024 EXAM: 1 VIEW XRAY OF THE ABDOMEN 02/16/2024 04:41:00 PM COMPARISON: None available. CLINICAL HISTORY: Constipation \\T \ pain FINDINGS: BOWEL: No pathologic dilatation of the large or small bowel loops. Diffuse air-filled loops of small bowel measure up to 2.9 cm with gas seen throughout the colon up to the rectum. Findings are nonspecific but may be seen with mild ileus or enteritis. SOFT TISSUES: No opaque urinary calculi. BONES: No acute osseous abnormality. IMPRESSION: 1. No bowel obstruction. 2. Findings nonspecific but may be seen with mild ileus or enteritis. Electronically signed by: Waddell Calk MD 02/16/2024 05:06 PM EDT RP Workstation: [id number]   CT Head Wo Contrast Result Date: 02/15/2024 EXAM: CT HEAD WITHOUT CONTRAST 02/15/2024 05:44:00 PM  TECHNIQUE: CT of the head was performed without the administration of intravenous contrast. Automated exposure control, iterative reconstruction, and/or weight based adjustment of the mA/kV was utilized to reduce the radiation dose to as low as reasonably achievable. COMPARISON: CT head 02/03/2024. CLINICAL HISTORY: Headache, increasing frequency or severity. Dizziness. Pt BIB [facility] from home due to dizziness. Pt was discharged yesterday from here after shunt placement on top of his head; pt had fall two weeks ago. Pt initial BP 80/60; after 1L bolus; 110/89. FINDINGS: BRAIN AND VENTRICLES: Postsurgical changes of left frontal burr hole and craniotomy for evacuation of underlying subdural hematoma. There is Arvine Clayburn mixed attenuation subdural collection overlying the left cerebral convexity, which now measures up to 1.9 cm in maximum thickness on coronal images. The collection previously measured up to 1.6 cm on the prior study when measuring in Dulse Rutan similar manner. Regions of hyperattenuating blood products are significantly decreased compared to prior. There is primarily isoattenuating to slightly hypoattenuating appearance of the subdural collection. Overall the subdural hematoma appears slightly increased overlying the left parietal lobe but is otherwise similar to slightly decreased over the left frontal lobe. There is approximately 5 mm rightward midline shift previously measuring 4 mm. The caliber of the lateral ventricles appears slightly improved compared to prior. There is trace subdural hemorrhage along the posterior falx and left tentorium. Previously noted subarachnoid hemorrhage, particularly along the left sylvian fissure, is nearly completely resolved. Significant interval decrease in pneumocephalus. ORBITS: Bilateral lens replacement. SINUSES: No acute abnormality. SOFT TISSUES AND SKULL: There is slightly decreased soft tissue swelling and soft tissue gas at the surgical site in the left frontal  scalp. IMPRESSION: 1. Mixed attenuation subdural collection overlying the left cerebral convexity, measuring up to 1.9 cm in maximum thickness, previously 1.6 cm measuring in Cyndie Woodbeck similar manner. Associated 5 mm rightward midline shift. The collection has slightly increased over the left parietal lobe and is otherwise similar to slightly decreased over the left frontal lobe compared to prior study. 2. Nearly complete resolution of previously noted subarachnoid hemorrhage. 3. Slightly improved caliber of the lateral ventricles compared to prior study. Electronically signed by: Donnice Mania MD 02/15/2024 06:26 PM EDT RP Workstation: [id number]   DG Chest Portable 1 View Result Date: 02/15/2024 CLINICAL DATA:  weakness EXAM: PORTABLE CHEST 1 VIEW COMPARISON:  Chest x-ray 04/06/2020 FINDINGS: The heart and mediastinal contours are unchanged. Atherosclerotic plaque. No focal consolidation. No pulmonary edema. No pleural effusion. No pneumothorax. No acute osseous abnormality. IMPRESSION: 1.  No active disease. 2.  Aortic Atherosclerosis (ICD10-I70.0). Electronically Signed   By: Morgane  Naveau M.D.   On: 02/15/2024 18:00        Scheduled Meds:  atorvastatin   40 mg Oral q1800   finasteride   5 mg Oral Daily   polyethylene glycol  17 g Oral BID   Continuous Infusions:   LOS: 0 days    Time spent: over 30 min     Meliton Monte, MD [facility]   To contact the attending provider between 7A-7P or the covering provider during after hours 7P-7A, please log into the web site [email or web address] and access using universal Tucker password for that web site. If you do not have the password, please call the hospital operator.  02/16/2024, 7:30 PM

## 2024-02-16 NOTE — Care Management Obs Status (Signed)
 MEDICARE OBSERVATION STATUS NOTIFICATION   Patient Details  Name: Iktan Aikman MRN: [id number] Date of Birth: 07-05-1950   Medicare Observation Status Notification Given:     Obs notice signed and a copy given  Claretta Deed 02/16/2024, 2:08 PM

## 2024-02-16 NOTE — Plan of Care (Signed)
  Problem: Clinical Measurements: Goal: Ability to maintain clinical measurements within normal limits will improve Outcome: Progressing Goal: Will remain free from infection Outcome: Progressing Goal: Diagnostic test results will improve Outcome: Progressing Goal: Respiratory complications will improve Outcome: Progressing Goal: Cardiovascular complication will be avoided Outcome: Progressing   Problem: Activity: Goal: Risk for activity intolerance will decrease Outcome: Progressing   Problem: Elimination: Goal: Will not experience complications related to bowel motility Outcome: Progressing Goal: Will not experience complications related to urinary retention Outcome: Progressing   Problem: Pain Managment: Goal: General experience of comfort will improve and/or be controlled Outcome: Progressing   Problem: Safety: Goal: Ability to remain free from injury will improve Outcome: Progressing   Problem: Skin Integrity: Goal: Risk for impaired skin integrity will decrease Outcome: Progressing

## 2024-02-16 NOTE — Progress Notes (Signed)
 Admission\ Note:  21:15H - Patient came from ED via stretcher accompanied by transport service with his wife and son. A&O x4. On room air. PIV on right and left AC on saline lock.  Safe precaution initiated: bed wheels lock, side rails up, floor mat placed and call bell within reach. Oriented to room set-up. Hooked on telebox [id number].

## 2024-02-16 NOTE — Progress Notes (Signed)
 Subjective: Patient reports doing well, feeling better this morning.   Objective: Vital signs in last 24 hours: Temp:  [98.4 F (36.9 C)-99.7 F (37.6 C)] 98.7 F (37.1 C) (09/18 0303) Pulse Rate:  [74-87] 75 (09/18 0303) Resp:  [17-19] 18 (09/18 0303) BP: (125-148)/(67-93) 144/93 (09/18 0303) SpO2:  [97 %-100 %] 98 % (09/18 0303)  Intake/Output from previous day: 09/17 0701 - 09/18 0700 In: 763.1 [I.V.:763.1] Out: 1700 [Urine:1700] Intake/Output this shift: No intake/output data recorded.  Neurologic: Grossly normal  Lab Results: Lab Results  Component Value Date   WBC 12.4 (H) 02/16/2024   HGB 13.7 02/16/2024   HCT 39.8 02/16/2024   MCV 89.4 02/16/2024   PLT 221 02/16/2024   Lab Results  Component Value Date   INR 1.0 02/01/2024   BMET Lab Results  Component Value Date   NA 136 02/16/2024   K 4.0 02/16/2024   CL 102 02/16/2024   CO2 23 02/16/2024   GLUCOSE 88 02/16/2024   BUN 22 02/16/2024   CREATININE 1.29 (H) 02/16/2024   CALCIUM  8.1 (L) 02/16/2024    Studies/Results: CT Head Wo Contrast Result Date: 02/15/2024 EXAM: CT HEAD WITHOUT CONTRAST 02/15/2024 05:44:00 PM TECHNIQUE: CT of the head was performed without the administration of intravenous contrast. Automated exposure control, iterative reconstruction, and/or weight based adjustment of the mA/kV was utilized to reduce the radiation dose to as low as reasonably achievable. COMPARISON: CT head 02/03/2024. CLINICAL HISTORY: Headache, increasing frequency or severity. Dizziness. Pt BIB [facility] from home due to dizziness. Pt was discharged yesterday from here after shunt placement on top of his head; pt had fall two weeks ago. Pt initial BP 80/60; after 1L bolus; 110/89. FINDINGS: BRAIN AND VENTRICLES: Postsurgical changes of left frontal burr hole and craniotomy for evacuation of underlying subdural hematoma. There is a mixed attenuation subdural collection overlying the left cerebral convexity, which now  measures up to 1.9 cm in maximum thickness on coronal images. The collection previously measured up to 1.6 cm on the prior study when measuring in a similar manner. Regions of hyperattenuating blood products are significantly decreased compared to prior. There is primarily isoattenuating to slightly hypoattenuating appearance of the subdural collection. Overall the subdural hematoma appears slightly increased overlying the left parietal lobe but is otherwise similar to slightly decreased over the left frontal lobe. There is approximately 5 mm rightward midline shift previously measuring 4 mm. The caliber of the lateral ventricles appears slightly improved compared to prior. There is trace subdural hemorrhage along the posterior falx and left tentorium. Previously noted subarachnoid hemorrhage, particularly along the left sylvian fissure, is nearly completely resolved. Significant interval decrease in pneumocephalus. ORBITS: Bilateral lens replacement. SINUSES: No acute abnormality. SOFT TISSUES AND SKULL: There is slightly decreased soft tissue swelling and soft tissue gas at the surgical site in the left frontal scalp. IMPRESSION: 1. Mixed attenuation subdural collection overlying the left cerebral convexity, measuring up to 1.9 cm in maximum thickness, previously 1.6 cm measuring in a similar manner. Associated 5 mm rightward midline shift. The collection has slightly increased over the left parietal lobe and is otherwise similar to slightly decreased over the left frontal lobe compared to prior study. 2. Nearly complete resolution of previously noted subarachnoid hemorrhage. 3. Slightly improved caliber of the lateral ventricles compared to prior study. Electronically signed by: Donnice Mania MD 02/15/2024 06:26 PM EDT RP Workstation: [id number]   DG Chest Portable 1 View Result Date: 02/15/2024 CLINICAL DATA:  weakness EXAM: PORTABLE CHEST 1  VIEW COMPARISON:  Chest x-ray 04/06/2020 FINDINGS: The heart and  mediastinal contours are unchanged. Atherosclerotic plaque. No focal consolidation. No pulmonary edema. No pleural effusion. No pneumothorax. No acute osseous abnormality. IMPRESSION: 1. No active disease. 2.  Aortic Atherosclerosis (ICD10-I70.0). Electronically Signed   By: Morgane  Naveau M.D.   On: 02/15/2024 18:00    Assessment/Plan: S/p crani for SDH who presented last night with some orthostatic hypotension. Will speak with Dr. Lanis about MMA embolization, I think he would be a good candidate.    LOS: 0 days    Suzen Lacks Anshu Wehner 02/16/2024, 8:15 AM

## 2024-02-16 NOTE — TOC Initial Note (Signed)
 Transition of Care [facility]) - Initial/Assessment Note    Patient Details  Name: Edwin Dickerson MRN: [id number] Date of Birth: [birth year]  [facility] CM/SW Contact:    Andrez JULIANNA George, RN Phone Number: 02/16/2024, 11:53 AM  Clinical Narrative:                  Plan is for MMA embolization tomorrow.  Pt is from home with his spouse. She is with him most of the time.  Wife manages his medications and provides needed transportation. IP Care management following.  Expected Discharge Plan: Home/Self Care Barriers to Discharge: Continued Medical Work up   Patient Goals and CMS Choice   CMS Medicare.gov Compare Post Acute Care list provided to:: Patient Choice offered to / list presented to : Patient, Spouse      Expected Discharge Plan and Services   Discharge Planning Services: CM Consult   Living arrangements for the past 2 months: Single Family Home                                      Prior Living Arrangements/Services Living arrangements for the past 2 months: Single Family Home Lives with:: Spouse Patient language and need for interpreter reviewed:: Yes Do you feel safe going back to the place where you live?: Yes      Need for Family Participation in Patient Care: Yes (Comment) Care giver support system in place?: Yes (comment) Current home services: DME (walker/ shower seat) Criminal Activity/Legal Involvement Pertinent to Current Situation/Hospitalization: No - Comment as needed  Activities of Daily Living      Permission Sought/Granted                  Emotional Assessment Appearance:: Appears stated age   Affect (typically observed):  (confusion) Orientation: : Oriented to Self   Psych Involvement: No (comment)  Admission diagnosis:  Orthostatic hypotension [I95.1] Dizziness [R42] SDH (subdural hematoma) (HCC) [S06.5XAA] Orthostatic dizziness [R42] Patient Active Problem List   Diagnosis Date Noted   Orthostatic  hypotension 02/15/2024   AKI (acute kidney injury) (HCC) 02/15/2024   Leukocytosis 02/15/2024   Hyponatremia 02/15/2024   Traumatic subdural hematoma (HCC) 02/06/2024   S/P craniotomy 02/01/2024   SDH (subdural hematoma) (HCC) 02/01/2024   Hypertension 02/01/2024   Hyperlipidemia 02/01/2024   Routine adult health maintenance 12/07/2016   Vision changes    Word finding difficulty    Elevated blood pressure reading    Benign prostatic hyperplasia with lower urinary tract symptoms    Chronic nonintractable headache    TIA (transient ischemic attack) 10/22/2016   Blurred vision, bilateral    Slurred speech    Transient cerebral ischemia    PCP:  Marelyn Axon, MD Pharmacy:   [facility] [address] [place] - [address] [place] [address] Phone: [phone] Fax: [phone]  [facility] [address] [place] [address] Phone: [phone] Fax: [phone]     Social Drivers of Health (SDOH) Social History: SDOH Screenings   Food Insecurity: No Food Insecurity (02/04/2024)  Housing: Low Risk  (02/04/2024)  Transportation Needs: No Transportation Needs (02/04/2024)  Utilities: Not At Risk (02/04/2024)  Social Connections: Moderately Integrated (02/04/2024)  Tobacco Use: Low Risk  (02/15/2024)  Recent Concern: Tobacco Use - Medium Risk (12/12/2023)   Received from [facility]   SDOH Interventions:  Readmission Risk Interventions    02/06/2024   11:14 AM  Readmission Risk Prevention Plan  Medication Screening Complete  Transportation Screening Complete

## 2024-02-16 NOTE — Plan of Care (Signed)
 C/o dizziness and became nauseated while standing to void. Got him back in bed and did orthostats and he was orthostatic. Lying SBP was in the 150s and standing SBP was in the 80s. Reached out to notify Dr Perri.  Resting in bed now and said he feels better but just hits him all of a sudden.  I explained to him about orthostasis and what to do for safety when he starts to feel that way.    Problem: Clinical Measurements: Goal: Ability to maintain clinical measurements within normal limits will improve Outcome: Progressing   Problem: Clinical Measurements: Goal: Diagnostic test results will improve Outcome: Progressing   Problem: Clinical Measurements: Goal: Cardiovascular complication will be avoided Outcome: Progressing   Problem: Activity: Goal: Risk for activity intolerance will decrease Outcome: Progressing   Problem: Coping: Goal: Level of anxiety will decrease Outcome: Progressing

## 2024-02-16 NOTE — Progress Notes (Signed)
  Echocardiogram 2D Echocardiogram has been performed.  Edwin Dickerson 02/16/2024, 2:31 PM

## 2024-02-17 ENCOUNTER — Encounter (HOSPITAL_COMMUNITY)
Admission: EM | Disposition: A | Discharge status: Home / Self Care | Payer: Self-pay | Source: Home / Self Care | Attending: Family Medicine

## 2024-02-17 ENCOUNTER — Inpatient Hospital Stay (HOSPITAL_COMMUNITY)

## 2024-02-17 ENCOUNTER — Encounter (HOSPITAL_COMMUNITY): Payer: Self-pay | Admitting: Internal Medicine

## 2024-02-17 ENCOUNTER — Inpatient Hospital Stay (HOSPITAL_COMMUNITY): Payer: Self-pay | Admitting: Certified Registered"

## 2024-02-17 DIAGNOSIS — E871 Hypo-osmolality and hyponatremia: Secondary | ICD-10-CM

## 2024-02-17 DIAGNOSIS — N179 Acute kidney failure, unspecified: Secondary | ICD-10-CM | POA: Diagnosis not present

## 2024-02-17 DIAGNOSIS — S065XAA Traumatic subdural hemorrhage with loss of consciousness status unknown, initial encounter: Secondary | ICD-10-CM

## 2024-02-17 DIAGNOSIS — I10 Essential (primary) hypertension: Secondary | ICD-10-CM | POA: Diagnosis not present

## 2024-02-17 DIAGNOSIS — I609 Nontraumatic subarachnoid hemorrhage, unspecified: Secondary | ICD-10-CM | POA: Diagnosis not present

## 2024-02-17 DIAGNOSIS — G459 Transient cerebral ischemic attack, unspecified: Secondary | ICD-10-CM

## 2024-02-17 DIAGNOSIS — I951 Orthostatic hypotension: Secondary | ICD-10-CM | POA: Diagnosis not present

## 2024-02-17 HISTORY — PX: IR ANGIO EXTERNAL CAROTID SEL EXT CAROTID UNI L MOD SED: IMG5370

## 2024-02-17 HISTORY — PX: IR ANGIOGRAM FOLLOW UP STUDY: IMG697

## 2024-02-17 HISTORY — PX: RADIOLOGY WITH ANESTHESIA: SHX6223

## 2024-02-17 HISTORY — PX: IR TRANSCATH/EMBOLIZ: IMG695

## 2024-02-17 HISTORY — PX: IR NEURO EACH ADD'L AFTER BASIC UNI LEFT (MS): IMG5373

## 2024-02-17 LAB — CORTISOL: Cortisol, Plasma: 22 ug/dL

## 2024-02-17 LAB — TSH: TSH: 2.382 u[IU]/mL (ref 0.350–4.500)

## 2024-02-17 LAB — GLUCOSE, CAPILLARY: Glucose-Capillary: 131 mg/dL — ABNORMAL HIGH (ref 70–99)

## 2024-02-17 SURGERY — RADIOLOGY WITH ANESTHESIA
Anesthesia: General

## 2024-02-17 MED ORDER — ORAL CARE MOUTH RINSE: 15.0000 mL | Freq: Once | OROMUCOSAL | Status: AC

## 2024-02-17 MED ORDER — SODIUM CHLORIDE 0.9 % IV SOLN: INTRAVENOUS | Status: DC

## 2024-02-17 MED ORDER — PHENYLEPHRINE 80 MCG/ML (10ML) SYRINGE FOR IV PUSH (FOR BLOOD PRESSURE SUPPORT)
PREFILLED_SYRINGE | INTRAVENOUS | Status: DC | PRN
  Administered 2024-02-17: 80 ug via INTRAVENOUS
  Administered 2024-02-17: 160 ug via INTRAVENOUS

## 2024-02-17 MED ORDER — LIDOCAINE 2% (20 MG/ML) 5 ML SYRINGE
INTRAMUSCULAR | Status: DC | PRN
  Administered 2024-02-17: 60 mg via INTRAVENOUS

## 2024-02-17 MED ORDER — AMISULPRIDE (ANTIEMETIC) 5 MG/2ML IV SOLN: 10.0000 mg | Freq: Once | INTRAVENOUS | Status: DC | PRN

## 2024-02-17 MED ORDER — SUGAMMADEX SODIUM 200 MG/2ML IV SOLN
INTRAVENOUS | Status: DC | PRN
  Administered 2024-02-17: 200 mg via INTRAVENOUS

## 2024-02-17 MED ORDER — ACETAMINOPHEN 500 MG PO TABS
ORAL_TABLET | ORAL | Status: AC
  Administered 2024-02-17: 1000 mg via ORAL
  Filled 2024-02-17: qty 2

## 2024-02-17 MED ORDER — FENTANYL CITRATE (PF) 250 MCG/5ML IJ SOLN
INTRAMUSCULAR | Status: DC | PRN
  Administered 2024-02-17 (×2): 50 ug via INTRAVENOUS

## 2024-02-17 MED ORDER — ACETAMINOPHEN 500 MG PO TABS: 1000.0000 mg | ORAL_TABLET | Freq: Once | ORAL | Status: AC

## 2024-02-17 MED ORDER — ONDANSETRON HCL 4 MG/2ML IJ SOLN
INTRAMUSCULAR | Status: DC | PRN
  Administered 2024-02-17: 4 mg via INTRAVENOUS

## 2024-02-17 MED ORDER — OXYCODONE HCL 5 MG PO TABS: 5.0000 mg | ORAL_TABLET | Freq: Once | ORAL | Status: DC | PRN

## 2024-02-17 MED ORDER — PHENYLEPHRINE HCL-NACL 20-0.9 MG/250ML-% IV SOLN
INTRAVENOUS | Status: DC | PRN
  Administered 2024-02-17: 20 ug/min via INTRAVENOUS

## 2024-02-17 MED ORDER — PROPOFOL 10 MG/ML IV BOLUS
INTRAVENOUS | Status: DC | PRN
  Administered 2024-02-17: 100 mg via INTRAVENOUS

## 2024-02-17 MED ORDER — NITROGLYCERIN 1 MG/10 ML FOR IR/CATH LAB
INTRA_ARTERIAL | Status: AC
  Filled 2024-02-17: qty 10

## 2024-02-17 MED ORDER — PROCHLORPERAZINE EDISYLATE 10 MG/2ML IJ SOLN
5.0000 mg | Freq: Four times a day (QID) | INTRAMUSCULAR | Status: DC | PRN
  Administered 2024-02-17: 5 mg via INTRAVENOUS
  Filled 2024-02-17: qty 2

## 2024-02-17 MED ORDER — LACTATED RINGERS IV SOLN: INTRAVENOUS | Status: DC | PRN

## 2024-02-17 MED ORDER — ONDANSETRON HCL 4 MG/2ML IJ SOLN: 4.0000 mg | Freq: Once | INTRAMUSCULAR | Status: DC | PRN

## 2024-02-17 MED ORDER — HYDROMORPHONE HCL 1 MG/ML IJ SOLN
0.5000 mg | Freq: Once | INTRAMUSCULAR | Status: AC | PRN
  Administered 2024-02-17: 0.5 mg via INTRAVENOUS
  Filled 2024-02-17: qty 0.5

## 2024-02-17 MED ORDER — CLEVIDIPINE BUTYRATE 0.5 MG/ML IV EMUL
INTRAVENOUS | Status: DC | PRN
  Administered 2024-02-17: 2 mg/h via INTRAVENOUS

## 2024-02-17 MED ORDER — HEPARIN SODIUM (PORCINE) 1000 UNIT/ML IJ SOLN
INTRAMUSCULAR | Status: DC | PRN
  Administered 2024-02-17: 2000 [IU] via INTRAVENOUS

## 2024-02-17 MED ORDER — CHLORHEXIDINE GLUCONATE 0.12 % MT SOLN
15.0000 mL | Freq: Once | OROMUCOSAL | Status: AC
  Administered 2024-02-17: 15 mL via OROMUCOSAL

## 2024-02-17 MED ORDER — HYDROMORPHONE HCL 1 MG/ML IJ SOLN: 0.5000 mg | Freq: Once | INTRAMUSCULAR | Status: DC

## 2024-02-17 MED ORDER — FENTANYL CITRATE (PF) 100 MCG/2ML IJ SOLN: 25.0000 ug | INTRAMUSCULAR | Status: DC | PRN

## 2024-02-17 MED ORDER — ROCURONIUM BROMIDE 10 MG/ML (PF) SYRINGE
PREFILLED_SYRINGE | INTRAVENOUS | Status: DC | PRN
  Administered 2024-02-17: 80 mg via INTRAVENOUS

## 2024-02-17 MED ORDER — FENTANYL CITRATE (PF) 100 MCG/2ML IJ SOLN
INTRAMUSCULAR | Status: AC
  Filled 2024-02-17: qty 2

## 2024-02-17 MED ORDER — DEXAMETHASONE SODIUM PHOSPHATE 10 MG/ML IJ SOLN
INTRAMUSCULAR | Status: DC | PRN
  Administered 2024-02-17: 5 mg via INTRAVENOUS

## 2024-02-17 MED ORDER — OXYCODONE HCL 5 MG/5ML PO SOLN: 5.0000 mg | Freq: Once | ORAL | Status: DC | PRN

## 2024-02-17 MED ORDER — IOHEXOL 300 MG/ML  SOLN
150.0000 mL | Freq: Once | INTRAMUSCULAR | Status: AC | PRN
  Administered 2024-02-17: 50 mL via INTRA_ARTERIAL

## 2024-02-17 NOTE — Progress Notes (Signed)
 [facility]  PROGRESS NOTE  Edwin Dickerson FMW:[id number] DOB: Oct 06, 1950 DOA: 02/15/2024 PCP: Marelyn Axon, MD   Brief HPI:    73 y.o. male with medical history significant of  hyperlipidemia, TIA, BPH, class I obesity, left eye retinal detachment status post eye surgery.  Recent subdural hematoma status post craniotomy 02/01/2024 discharged to CIR and finally returned home yesterday.  Patient presents to the ED today via EMS from home for evaluation of dizziness and hypotension.  Blood pressure 80/60 with EMS and was given 1 L IV fluids with improvement of blood pressure to 110/89.  In the ED, orthostatics noted to be positive as his SBP dropped from 124 supine to 91 immediately upon standing and patient felt dizzy.  Afebrile.  Not tachycardic, tachypneic, or hypoxic.  Labs notable for WBC count 17.8, hemoglobin 14.7, platelet count 302k, sodium 132, bicarb 20, BUN 29, creatinine 1.4 (baseline 1.0), calcium  7.9, albumin 3.0, ALT 63, T. bili 1.8, AST and alk phos normal, UA not suggestive of infection.  EKG showing sinus rhythm and no acute ischemic changes.  Chest x-ray showing no active disease.  CT head showing mixed attenuation subdural collection overlying the left cerebral convexity measuring up to 1.9 cm in maximum thickness, previously 1.6 cm measuring in a similar manner.  Associated 5 mm rightward midline shift.  The collection has slightly increased over the left parietal lobe and is otherwise similar to slightly decreased over the left frontal lobe compared to prior study.  Showing nearly complete resolution of previously noted subarachnoid hemorrhage.  In addition, showing slightly improved caliber of the lateral ventricles compared to prior study.  EDP discussed CT head findings with neurosurgeon Dr. Mavis who recommended admission for observation, continuing neurochecks, and their service will consult in the morning.  Patient was given 1 L normal saline bolus in the ED.     Patient  states he was doing well when he left rehab yesterday.  Today in the morning he was walking inside his house when he started feeling dizzy.  Sometime during the day his wife noticed that his speech was slightly slurred but has since improved and now back to normal.  No upper or lower extremity weakness or paresthesias reported.  He has not had any falls since he left the hospital.  Patient denies headaches.  Reports mild cough since he was in the hospital.  Denies fevers, chest pain, shortness of breath, nausea, vomiting, abdominal pain, or diarrhea.  He does not on antiplatelet agents or anticoagulation.  Reports history of left eye problems for which he has undergone surgery twice.  Most recently underwent surgery in mid June of this year for left eye retinal detachment.      Assessment/Plan:   Recent subdural hematoma status post craniotomy 02/01/2024 CT head showing mixed attenuation subdural collection overlying the left cerebral convexity measuring up to 1.9 cm in maximum thickness, previously 1.6 cm measuring in a similar manner.  Associated 5 mm rightward midline shift.  The collection has slightly increased over the left parietal lobe and is otherwise similar to slightly decreased over the left frontal lobe compared to prior study.  nearly complete resolution of previously noted subarachnoid hemorrhage.  slightly improved caliber of the lateral ventricles compared to prior study.   Note hx surgeries done on his left eye twice in the past with most recent surgery in mid June of this year for retinal detachment.   Seen by neurosurgery, underwent meningeal artery embolization today   Orthostatic hypotension Echo  showed EF of 60 to 65%, grade 1 diastolic dysfunction TSH 2.382 Serum cortisol 22.0 Therapy when able    Back Pain  Constipation  Mild Ileus Plain film with possible mild ileus Follow    AKI Likely secondary to dehydration.  BUN 29, creatinine 1.4 (baseline 1.0).    Avoid  nephrotoxic agents/hold home losartan . -Follow BMP in am   Leukocytosis Chest x-ray not suggestive of pneumonia.  UA not suggestive of infection.  No other obvious infectious source.  No signs of sepsis.  Monitor WBC count.   Mild hyponatremia Resolved   Mild elevation of liver enzymes Trend   Hyperlipidemia History of TIA Continue Lipitor.   BPH Continue finasteride .    Medications     atorvastatin   40 mg Oral q1800   finasteride   5 mg Oral Daily   polyethylene glycol  17 g Oral BID     Data Reviewed:   CBG:  Recent Labs  Lab 02/15/24 2119 02/16/24 0826 02/17/24 0735  GLUCAP 86 91 131*    SpO2: 96 %    Vitals:   02/17/24 1400 02/17/24 1415 02/17/24 1430 02/17/24 1500  BP: 138/79 121/79 (!) 142/79 139/78  Pulse: 77 76 64 72  Resp: 14 16 12 16   Temp:    98 F (36.7 C)  TempSrc:    Oral  SpO2: 94% 94% 95% 96%  Weight:      Height:          Data Reviewed:  Basic Metabolic Panel: Recent Labs  Lab 02/11/24 1848 02/13/24 0610 02/15/24 1711 02/15/24 1718 02/16/24 0436  NA 130* 134* 132* 133* 136  K 4.8 4.7 3.8 3.8 4.0  CL 99 104 100 100 102  CO2 21* 20* 20*  --  23  GLUCOSE 146* 132* 90 91 88  BUN 35* 29* 29* 33* 22  CREATININE 1.29* 1.02 1.39* 1.50* 1.29*  CALCIUM  8.6* 8.7* 7.9*  --  8.1*    CBC: Recent Labs  Lab 02/12/24 1035 02/15/24 1711 02/15/24 1718 02/16/24 0436  WBC 17.6* 17.8*  --  12.4*  NEUTROABS 14.5* 17.3*  --   --   HGB 15.0 14.7 13.9 13.7  HCT 43.0 43.2 41.0 39.8  MCV 87.4 90.6  --  89.4  PLT 296 302  --  221    LFT Recent Labs  Lab 02/15/24 1711 02/16/24 0436  AST 36 27  ALT 63* 54*  ALKPHOS 80 63  BILITOT 1.8* 1.9*  PROT 5.7* 5.1*  ALBUMIN 3.0* 2.9*     Antibiotics: Anti-infectives (From admission, onward)    None        DVT prophylaxis: SCDs  Code Status: Full code  Family Communication: Wife at bedside   CONSULTS neurosurgery   Subjective   Seen after surgery  today   Objective    Physical Examination:   General-appears in no acute distress Heart-S1-S2, regular, no murmur auscultated Lungs-clear to auscultation bilaterally, no wheezing or crackles auscultated Abdomen-soft, nontender, no organomegaly Extremities-no edema in the lower extremities Neuro-alert, oriented x3, no focal deficit noted  Status is: Inpatient:          Sabas GORMAN Brod   [facility] If 7PM-7AM, please contact night-coverage at [email or web address], Office  [phone]   02/17/2024, 7:04 PM  LOS: 1 day

## 2024-02-17 NOTE — Anesthesia Procedure Notes (Addendum)
 Procedure Name: Intubation Date/Time: 02/17/2024 12:17 PM  Performed by: Delores Dus, CRNAPre-anesthesia Checklist: Patient identified, Emergency Drugs available, Suction available and Patient being monitored Patient Re-evaluated:Patient Re-evaluated prior to induction Oxygen Delivery Method: Circle system utilized Preoxygenation: Pre-oxygenation with 100% oxygen Induction Type: IV induction Ventilation: Mask ventilation without difficulty Laryngoscope Size: Miller and 2 Grade View: Grade I Tube type: Oral Tube size: 7.0 mm Number of attempts: 1 Airway Equipment and Method: Stylet and Oral airway Placement Confirmation: ETT inserted through vocal cords under direct vision, positive ETCO2 and breath sounds checked- equal and bilateral Secured at: 22 cm Tube secured with: Tape Dental Injury: Teeth and Oropharynx as per pre-operative assessment

## 2024-02-17 NOTE — Progress Notes (Signed)
  NEUROSURGERY PROGRESS NOTE   No issues overnight. History reviewed with Dr. Joshua and pt/family at bedside. Briefly, pt had left crani for cSDH about 2 weeks ago. Was doing well, but began c/o pre-syncopal sx. CT demonstrated some recurrence of cSDH. I was asked to see him for possible MMA embolization.  EXAM:  BP 111/82 (BP Location: Left Arm)   Pulse 81   Temp 98.6 F (37 C) (Oral)   Resp 18   SpO2 96%   Awake, alert, oriented  Speech fluent, appropriate  CN grossly intact  5/5 BUE/BLE, no pronator drift  IMAGING: CT and MRI demonstrate recurrence of left convexity SDH with associated local mass effect. No HCP.  IMPRESSION:  73 y.o. male ~2wks s/p left crani for cSDH with radiographic recurrence which is largely asymptomatic. I therefore think he would be a good candidate for left MMA embolization  PLAN: - Will plan on MMA embo tomorrow - NPO p MN  I have reviewed the situation including imaging findings with the patient and family. We discussed treatment options including continued expectant observation, repeat surgical evacuation, and MMA embo. For the reasons above, I did recommend proceeding with the embolization procedure. We reviewed the details of the procedure and expected postop course/recovery. We discussed risks to include stroke, arterial dissection, contrast nephropathy, and groin hematoma. All their questions today were answered and the patient verbally provided informed consent to proceed.   Gerldine Maizes, MD [facility]

## 2024-02-17 NOTE — Transfer of Care (Signed)
 Immediate Anesthesia Transfer of Care Note  Patient: Edwin Dickerson  Procedure(s) Performed: RADIOLOGY WITH ANESTHESIA  Patient Location: PACU  Anesthesia Type:General  Level of Consciousness: awake, alert , and oriented  Airway & Oxygen Therapy: Patient Spontanous Breathing and Patient connected to face mask oxygen  Post-op Assessment: Report given to RN and Post -op Vital signs reviewed and stable  Post vital signs: Reviewed and stable  Last Vitals:  Vitals Value Taken Time  BP    Temp    Pulse 76 02/17/24 14:15  Resp 15 02/17/24 14:15  SpO2 90 % 02/17/24 14:15  Vitals shown include unfiled device data.  Last Pain:  Vitals:   02/17/24 1110  TempSrc:   PainSc: 8       Patients Stated Pain Goal: 3 (02/17/24 1108)  Complications: No notable events documented.

## 2024-02-17 NOTE — Sedation Documentation (Signed)
 Handoff with PACU RN  Right femoral site remains level 0, dressing of gauze and tegaderm remains clean/dry/intact.  Pulses remain the same.   5 fr exoseal was deployed at 1328.

## 2024-02-17 NOTE — Plan of Care (Signed)
  Problem: Clinical Measurements: Goal: Cardiovascular complication will be avoided Outcome: Progressing   Problem: Activity: Goal: Risk for activity intolerance will decrease Outcome: Progressing   Problem: Nutrition: Goal: Adequate nutrition will be maintained Outcome: Progressing   Problem: Pain Managment: Goal: General experience of comfort will improve and/or be controlled Outcome: Progressing   Problem: Safety: Goal: Ability to remain free from injury will improve Outcome: Progressing

## 2024-02-17 NOTE — Op Note (Signed)
 ENDOVASCULAR NEUROSURGERY OPERATIVE NOTE   PROCEDURE: Onyx embolization of left middle meningeal artery   HISTORY:   The patient is a 73 y.o. yo male with a history of chronic subdural hematoma for which he underwent craniotomy for evacuation approximately 2 weeks ago.  Patient was doing well, but was readmitted to the hospital with episodes of hypotension and presyncope.  Imaging revealed some recurrence of the chronic subdural hematoma with associated local mass effect.  Thankfully, patient remains largely asymptomatic from the recurrence.  Middle meningeal artery embolization was therefore requested.  APPROACH:   The technical aspects of the procedure as well as its potential risks and benefits were reviewed with the patient and family. These risks included but were not limited stroke leading to weakness, numbness, paralysis, coma, death, allergic reaction, damage to organs/vital structures, and hematoma formation. With an understanding of these risks, informed consent was obtained and witnessed.    The patient was placed in the supine position on the angiography table and the skin of right groin prepped in the usual sterile fashion. The procedure was performed under general anesthesia monitored by the anesthesia service.  Short 5Fr sheath was placed in the right common femoral artery using standard seldinger technique. Position of the sheath was documented with fluoro-phase images.    HEPARIN :  2000 Units total.    CONTRAST AGENT:  See IR records   FLUOROSCOPY TIME:  See IR records    CATHETER(S) AND WIRE(S):    5-French MPD guide catheter   0.035" glidewire   Apollo microcatheter x2 Ch[place]ai 10 microwire x2  LIQUID EMBOLIC AGENT USED: Onyx-18  VESSELS CATHETERIZED:   Left internal carotid   Left external carotid   Left middle meningeal artery Right common femoral  VESSELS STUDIED:   Left internal carotid, head Left external carotid, head Left middle meningeal artery,  microcatheter run Left external carotid, post-embolization Left internal carotid, control  Right femoral  PROCEDURAL NARRATIVE:   The MPD guide catheter was introduced over the microwire.  The left internal carotid artery was selected.  Cerebral angiogram was taken.  The guide catheter was then positioned in the left external carotid artery and angiogram again taken.  After review of the images I elected to proceed with the embolization.  Under roadmap guidance, the Apollo microcatheter was introduced over the microwire and the middle meningeal artery was selected.  Microcatheter run was then taken.  After review of the images again, we elected to proceed with the embolization.  Initially, I elected to embolize the more anterior branch of the middle meningeal artery, distal to a branch which appeared to enter into the orbit, possibly perfusing extraocular musculature and the lacrimal gland.  The catheter was positioned distal to this vessel over the microwire.  The microwire was removed and the catheter was flushed with DMSO.  Under standard roadmap and blank roadmap technique, the middle meningeal artery was embolized with Onyx.  After completion of the embolization, the microcatheter was removed without incident.  Angiogram was again taken through the guide catheter.  A second Apollo microcatheter was then introduced over another microwire.  The more posteriorly directed middle meningeal branch was then selected.  Again, microcatheter run was taken.  In a similar fashion, the microcatheter was then flushed with DMSO and the middle meningeal arteries posterior branch was embolized with Onyx 18.  After completion of the embolization, the catheter was removed without incident.  Final postembolization angiogram was taken through the guide catheter.  The MPD guide catheter was  then positioned in the left internal carotid artery and final control angiogram was taken.  After review of the images, the guide  catheter was removed without incident.  INTERPRETATION:   Left internal carotid, head:   Injection reveals the presence of a widely patent ICA, M1, and A1 segments and their branches. No aneurysms, arteriovenous malformations, or high flow fistulas are visualized.  The ophthalmic artery appears to have normal origin visualized retinal/choroidal perfusion.  The parenchymal and venous phases are unremarkable, with some mass effect upon the convexity from the known overlying chronic subdural hematoma. The venous sinuses are widely patent.    Left external carotid, head:   Visualized cranial branches of the external carotid artery are unremarkable.  Of note, there is normal origin of the middle meningeal artery.  No perfusion of the brain is noted.  There is no opacification of the intracranial cortical veins or dural venous sinuses.  Left middle meningeal artery: Microcatheter runs taken in the middle meningeal artery reveals no perfusion of the underlying brain.  There does appear to be a vessel arising from the more anteriorly directed branch of the middle meningeal artery which supplies contents of the left orbit.  There is slight contrast blush to suggest perfusion to the known chronic subdural hematoma membrane.  Left external carotid, post-embolization: The visualized branches of the external carotid artery remain unremarkable.  Onyx cast is seen within the anterior and posterior branches of the intracranial middle meningeal artery, while the branch coursing into the left orbit remains patent.  Left internal carotid, control: The left internal carotid artery is widely patent, with normal bifurcation.  There is normal perfusion of the left hemisphere, without any branch occlusions or perfusion deficits.  Venous sinuses remain patent.  Right femoral:    Normal vessel. No significant atherosclerotic disease. Arterial sheath in adequate position.   DISPOSITION:  Upon completion of the study, the  femoral sheath was removed and hemostasis obtained using a 5-Fr Exoseal closure device. Good proximal and distal lower extremity pulses were documented upon achievement of hemostasis. The procedure was well tolerated and no early complications were observed.  The patient was transferred to the PACU to be positioned flat in bed for 3 hours.    IMPRESSION:  1. 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    Gerldine Maizes, MD [facility]

## 2024-02-17 NOTE — Progress Notes (Signed)
 Patient ID: Edwin Dickerson, male   DOB: [birth year], 73 y.o.   MRN: [id number] He is awake and alert nonfocal.  Incision is clean dry and intact.  He does complain of some mild headache.  For MMA embolization today.

## 2024-02-17 NOTE — Anesthesia Postprocedure Evaluation (Signed)
 Anesthesia Post Note  Patient: Edwin Dickerson  Procedure(s) Performed: RADIOLOGY WITH ANESTHESIA     Patient location during evaluation: PACU Anesthesia Type: General Level of consciousness: awake and alert, oriented and patient cooperative Pain management: pain level controlled Vital Signs Assessment: post-procedure vital signs reviewed and stable Respiratory status: spontaneous breathing, nonlabored ventilation and respiratory function stable Cardiovascular status: blood pressure returned to baseline and stable Postop Assessment: no apparent nausea or vomiting Anesthetic complications: no   No notable events documented.  Last Vitals:  Vitals:   02/17/24 1430 02/17/24 1500  BP: (!) 142/79 139/78  Pulse: 64 72  Resp: 12 16  Temp:  36.7 C  SpO2: 95% 96%    Last Pain:  Vitals:   02/17/24 1500  TempSrc: Oral  PainSc: 0-No pain   Pain Goal: Patients Stated Pain Goal: 3 (02/17/24 1108)                 Almarie CHRISTELLA Marchi

## 2024-02-17 NOTE — Plan of Care (Signed)

## 2024-02-17 NOTE — Anesthesia Preprocedure Evaluation (Addendum)
 Anesthesia Evaluation  Patient identified by MRN, date of birth, ID band Patient awake    Reviewed: Allergy & Precautions, NPO status , Patient's Chart, lab work & pertinent test results  Airway Mallampati: III  TM Distance: >3 FB Neck ROM: Full    Dental  (+) Missing, Poor Dentition, Dental Advisory Given   Pulmonary neg pulmonary ROS   Pulmonary exam normal breath sounds clear to auscultation       Cardiovascular hypertension (148/92 preop), Pt. on medications Normal cardiovascular exam Rhythm:Regular Rate:Normal     Neuro/Psych  Headaches L crani for evacuation of SDH on 9/3 and was subsequently dc'd to rehab. He represented to ED with hypotension. Updated CTH revealing a left parietal subacute/chronic subdural hematoma with mild mass effect.  for MMA embolization.     TIA negative psych ROS   GI/Hepatic negative GI ROS, Neg liver ROS,,,  Endo/Other  negative endocrine ROS  BMI 31  Renal/GU Renal disease (cr 1.29)  negative genitourinary   Musculoskeletal negative musculoskeletal ROS (+)    Abdominal   Peds  Hematology negative hematology ROS (+)   Anesthesia Other Findings   Reproductive/Obstetrics negative OB ROS                              Anesthesia Physical Anesthesia Plan  ASA: 3  Anesthesia Plan: General   Post-op Pain Management: Tylenol  PO (pre-op)*   Induction: Intravenous  PONV Risk Score and Plan: 2 and Ondansetron , Dexamethasone  and Treatment may vary due to age or medical condition  Airway Management Planned: Oral ETT  Additional Equipment: Arterial line  Intra-op Plan:   Post-operative Plan: Extubation in OR  Informed Consent: I have reviewed the patients History and Physical, chart, labs and discussed the procedure including the risks, benefits and alternatives for the proposed anesthesia with the patient or authorized representative who has indicated  his/her understanding and acceptance.     Dental advisory given  Plan Discussed with: CRNA  Anesthesia Plan Comments: (Last airway note: Induction Type: IV induction and Rapid sequence Laryngoscope Size: Mac and 4 Grade View: Grade I Tube type: Oral Tube size: 8.0 mm Number of attempts: 1 )         Anesthesia Quick Evaluation

## 2024-02-17 NOTE — Anesthesia Procedure Notes (Signed)
 Arterial Line Insertion Start/End9/19/2025 11:40 AM, 02/17/2024 11:50 AM Performed by: Merla Almarie CHRISTELLA ROSALEA, Delores Dus, CRNA, CRNA  Patient location: Pre-op. Preanesthetic checklist: patient identified, IV checked, site marked, risks and benefits discussed, surgical consent, monitors and equipment checked, pre-op evaluation, timeout performed and anesthesia consent Lidocaine  1% used for infiltration Left, radial was placed Catheter size: 20 G Hand hygiene performed  and maximum sterile barriers used   Attempts: 1 Procedure performed using ultrasound guided technique. Ultrasound Notes:anatomy identified, needle tip was noted to be adjacent to the nerve/plexus identified and no ultrasound evidence of intravascular and/or intraneural injection Following insertion, dressing applied. Post procedure assessment: normal and unchanged  Patient tolerated the procedure well with no immediate complications.

## 2024-02-17 NOTE — Progress Notes (Addendum)
    Providing Compassionate, Quality Care - Together   NEUROSURGERY PROGRESS NOTE     S: No issues overnight. In short, patient has a h/o L crani for evacuation of SDH on 9/3 and was subsequently dc'd to rehab. He represented to ED with hypotension. Updated CTH revealing a left parietal subacute/chronic subdural hematoma with mild mass effect. Asked to evaluate for MMA embolization.    O: EXAM:  BP (!) 155/81 (BP Location: Left Arm)   Pulse 68   Temp 98.6 F (37 C) (Oral)   Resp 16   SpO2 98%     Awake, alert, oriented  Speech fluent, appropriate  CNs grossly intact  BUE/BLE 5/5 SILTx4 Craniotomy incision c/d/i   ASSESSMENT:  73 y.o. with h/o recent L crani with left parietal subacute/chronic subdural hematoma with mild mass effect    PLAN: -Will proceed with L MMA embolization today.  -Procedure risks, benefits, alternatives d/w patient.    Camie Pickle, [facility]   I have again reviewed the procedure/alternatives/risks/benefits with the patient and wife. All questions were answered.  Gerldine Maizes, MD [facility]

## 2024-02-18 DIAGNOSIS — I951 Orthostatic hypotension: Secondary | ICD-10-CM | POA: Diagnosis not present

## 2024-02-18 LAB — BASIC METABOLIC PANEL WITH GFR
Anion gap: 9 (ref 5–15)
BUN: 15 mg/dL (ref 8–23)
CO2: 24 mmol/L (ref 22–32)
Calcium: 8.4 mg/dL — ABNORMAL LOW (ref 8.9–10.3)
Chloride: 99 mmol/L (ref 98–111)
Creatinine, Ser: 0.94 mg/dL (ref 0.61–1.24)
GFR, Estimated: >60 mL/min (ref >60)
Glucose, Bld: 133 mg/dL — ABNORMAL HIGH (ref 70–99)
Potassium: 4.5 mmol/L (ref 3.5–5.1)
Sodium: 132 mmol/L — ABNORMAL LOW (ref 135–145)

## 2024-02-18 LAB — CBC
HCT: 37 % — ABNORMAL LOW (ref 39.0–52.0)
Hemoglobin: 13 g/dL (ref 13.0–17.0)
MCH: 30.9 pg (ref 26.0–34.0)
MCHC: 35.1 g/dL (ref 30.0–36.0)
MCV: 87.9 fL (ref 80.0–100.0)
Platelets: 207 K/uL (ref 150–400)
RBC: 4.21 MIL/uL — ABNORMAL LOW (ref 4.22–5.81)
RDW: 12.4 % (ref 11.5–15.5)
WBC: 12.6 K/uL — ABNORMAL HIGH (ref 4.0–10.5)
nRBC: 0 % (ref 0.0–0.2)

## 2024-02-18 MED ORDER — SCOPOLAMINE 1 MG/3DAYS TD PT72
1.0000 | MEDICATED_PATCH | TRANSDERMAL | Status: DC
  Administered 2024-02-18: 1 mg via TRANSDERMAL
  Filled 2024-02-18: qty 1

## 2024-02-18 NOTE — Progress Notes (Signed)
 PROGRESS NOTE    Edwin Dickerson  FMW:[id number] DOB: 06-04-1950 DOA: 02/15/2024 PCP: Marelyn Axon, MD  No chief complaint on file.   Brief Narrative:   Darby Broers is Kaymon Denomme 73 y.o. male with medical history significant of  hyperlipidemia, TIA, BPH, class I obesity, left eye retinal detachment status post eye surgery.  Recent subdural hematoma status post craniotomy 02/01/2024 discharged to CIR and finally returned home yesterday.  Patient presents to the ED today via EMS from home for evaluation of dizziness and hypotension.  Blood pressure 80/60 with EMS and was given 1 L IV fluids with improvement of blood pressure to 110/89.  In the ED, orthostatics noted to be positive as his SBP dropped from 124 supine to 91 immediately upon standing and patient felt dizzy.  Afebrile.  Not tachycardic, tachypneic, or hypoxic.  Labs notable for WBC count 17.8, hemoglobin 14.7, platelet count 302k, sodium 132, bicarb 20, BUN 29, creatinine 1.4 (baseline 1.0), calcium  7.9, albumin 3.0, ALT 63, T. bili 1.8, AST and alk phos normal, UA not suggestive of infection.  EKG showing sinus rhythm and no acute ischemic changes.  Chest x-ray showing no active disease.  CT head showing mixed attenuation subdural collection overlying the left cerebral convexity measuring up to 1.9 cm in maximum thickness, previously 1.6 cm measuring in Claxton Levitz similar manner.  Associated 5 mm rightward midline shift.  The collection has slightly increased over the left parietal lobe and is otherwise similar to slightly decreased over the left frontal lobe compared to prior study.  Showing nearly complete resolution of previously noted subarachnoid hemorrhage.  In addition, showing slightly improved caliber of the lateral ventricles compared to prior study.  EDP discussed CT head findings with neurosurgeon Dr. Mavis who recommended admission for observation, continuing neurochecks, and their service will consult in the morning.  Patient was given 1 L normal  saline bolus in the ED.     Patient states he was doing well when he left rehab yesterday.  Today in the morning he was walking inside his house when he started feeling dizzy.  Sometime during the day his wife noticed that his speech was slightly slurred but has since improved and now back to normal.  No upper or lower extremity weakness or paresthesias reported.  He has not had any falls since he left the hospital.  Patient denies headaches.  Reports mild cough since he was in the hospital.  Denies fevers, chest pain, shortness of breath, nausea, vomiting, abdominal pain, or diarrhea.  He does not on antiplatelet agents or anticoagulation.  Reports history of left eye problems for which he has undergone surgery twice.  Most recently underwent surgery in mid June of this year for left eye retinal detachment.  Assessment & Plan:   Principal Problem:   Orthostatic hypotension Active Problems:   SDH (subdural hematoma) (HCC)   AKI (acute kidney injury) (HCC)   Leukocytosis   Hyponatremia  Recent subdural hematoma status post craniotomy 02/01/2024 CT head showing mixed attenuation subdural collection overlying the left cerebral convexity measuring up to 1.9 cm in maximum thickness, previously 1.6 cm measuring in Eulogio Requena similar manner.  Associated 5 mm rightward midline shift.  The collection has slightly increased over the left parietal lobe and is otherwise similar to slightly decreased over the left frontal lobe compared to prior study.  nearly complete resolution of previously noted subarachnoid hemorrhage.  slightly improved caliber of the lateral ventricles compared to prior study.   Note hx surgeries done on  his left eye twice in the past with most recent surgery in mid June of this year for retinal detachment.   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  BUN 29, creatinine 1.4 (baseline 1.0).  Continue IV fluid hydration and monitor renal function.  Avoid nephrotoxic agents/hold home losartan .   Leukocytosis Chest x-ray not suggestive of pneumonia.  UA not suggestive of infection.  No other obvious infectious source.  No signs of sepsis.  Monitor WBC count.   Mild hyponatremia Continue IV fluid hydration with normal saline and monitor labs.   Mild elevation of liver enzymes Trend   Hyperlipidemia History of TIA Continue Lipitor.   BPH Continue finasteride .  Obesity Body mass index is 31.32 kg/m.    DVT prophylaxis: SCD Code Status: full Family Communication: wife at bedside Disposition:   Status is: Observation The patient will require care spanning > 2 midnights and should be moved to inpatient because: need for continued inpatient care   Consultants:  neurosurgery  Procedures:  none  Antimicrobials:  Anti-infectives (From admission, onward)    None       Subjective: No new complaints  Objective: Vitals:   02/17/24 2328 02/18/24 0422 02/18/24 0721 02/18/24 1102  BP: 128/76 (!) 140/91 (!) 148/91 122/78  Pulse: 65 65 73 68  Resp: 18 18 20 18   Temp: 99.5 F (37.5 C) 98.5 F (36.9 C) 97.8 F (36.6 C) 98.1 F (36.7 C)  TempSrc:   Oral Oral  SpO2: 96% 96% 96% 99%  Weight:      Height:        Intake/Output Summary (Last 24 hours) at 02/18/2024 1527 Last data filed at 02/18/2024 1100 Gross per 24 hour  Intake 800 ml  Output 3600 ml  Net -2800 ml   Filed Weights   02/17/24 1050  Weight: 90.7 kg    Examination:  General: No acute distress. Cardiovascular: RRR Lungs: unlabored Neurological: Alert and oriented 3. Moves all extremities 4. Cranial nerves II through XII grossly intact. Extremities: No clubbing or cyanosis. No edema.  Data Reviewed: I have personally reviewed  following labs and imaging studies  CBC: Recent Labs  Lab 02/12/24 1035 02/15/24 1711 02/15/24 1718 02/16/24 0436 02/18/24 0557  WBC 17.6* 17.8*  --  12.4* 12.6*  NEUTROABS 14.5* 17.3*  --   --   --   HGB 15.0 14.7 13.9 13.7 13.0  HCT 43.0 43.2 41.0 39.8 37.0*  MCV 87.4 90.6  --  89.4 87.9  PLT 296 302  --  221 207    Basic Metabolic Panel: Recent Labs  Lab 02/11/24 1848 02/13/24 0610 02/15/24 1711 02/15/24 1718 02/16/24 0436 02/18/24 0557  NA 130* 134* 132* 133* 136 132*  K 4.8 4.7 3.8 3.8 4.0 4.5  CL 99 104 100 100 102 99  CO2 21* 20* 20*  --  23 24  GLUCOSE 146* 132* 90 91 88 133*  BUN 35* 29* 29* 33* 22 15  CREATININE 1.29* 1.02 1.39* 1.50* 1.29* 0.94  CALCIUM  8.6* 8.7* 7.9*  --  8.1* 8.4*    GFR: Estimated Creatinine Clearance: 72.9 mL/min (by C-G formula based on SCr of 0.94 mg/dL).  Liver Function Tests: Recent Labs  Lab 02/15/24 1711  02/16/24 0436  AST 36 27  ALT 63* 54*  ALKPHOS 80 63  BILITOT 1.8* 1.9*  PROT 5.7* 5.1*  ALBUMIN 3.0* 2.9*    CBG: Recent Labs  Lab 02/15/24 2119 02/16/24 0826 02/17/24 0735  GLUCAP 86 91 131*     No results found for this or any previous visit (from the past 240 hours).       Radiology Studies: DG Abd 1 View Result Date: 02/16/2024 EXAM: 1 VIEW XRAY OF THE ABDOMEN 02/16/2024 04:41:00 PM COMPARISON: None available. CLINICAL HISTORY: Constipation \\T \ pain FINDINGS: BOWEL: No pathologic dilatation of the large or small bowel loops. Diffuse air-filled loops of small bowel measure up to 2.9 cm with gas seen throughout the colon up to the rectum. Findings are nonspecific but may be seen with mild ileus or enteritis. SOFT TISSUES: No opaque urinary calculi. BONES: No acute osseous abnormality. IMPRESSION: 1. No bowel obstruction. 2. Findings nonspecific but may be seen with mild ileus or enteritis. Electronically signed by: Waddell Calk MD 02/16/2024 05:06 PM EDT RP Workstation: [id number]        Scheduled  Meds:  atorvastatin   40 mg Oral q1800   finasteride   5 mg Oral Daily   polyethylene glycol  17 g Oral BID   scopolamine   1 patch Transdermal Q72H   Continuous Infusions:   LOS: 2 days    Time spent: over 30 min     Meliton Monte, MD [facility]   To contact the attending provider between 7A-7P or the covering provider during after hours 7P-7A, please log into the web site [email or web address] and access using universal [place] password for that web site. If you do not have the password, please call the hospital operator.  02/18/2024, 3:27 PM

## 2024-02-18 NOTE — Plan of Care (Signed)
  Problem: Education: Goal: Knowledge of General Education information will improve Description: Including pain rating scale, medication(s)/side effects and non-pharmacologic comfort measures Outcome: Progressing   Problem: Health Behavior/Discharge Planning: Goal: Ability to manage health-related needs will improve Outcome: Progressing   Problem: Clinical Measurements: Goal: Ability to maintain clinical measurements within normal limits will improve Outcome: Progressing Goal: Diagnostic test results will improve Outcome: Progressing   Problem: Nutrition: Goal: Adequate nutrition will be maintained Outcome: Progressing   Problem: Coping: Goal: Level of anxiety will decrease Outcome: Progressing

## 2024-02-18 NOTE — Progress Notes (Signed)
    Providing Compassionate, Quality Care - Together   NEUROSURGERY PROGRESS NOTE     S: No issues overnight.    O: EXAM:  BP (!) 148/91 (BP Location: Left Arm)   Pulse 73   Temp 97.8 F (36.6 C) (Oral)   Resp 20   Ht 5' 7 (1.702 m)   Wt 90.7 kg   SpO2 96%   BMI 31.32 kg/m     Awake, alert, oriented  Speech fluent, appropriate  CNs grossly intact  MAEs R groin site soft, nontender Dressing c/d/i   ASSESSMENT:  73 y.o. with h/o recent L crani with L subacute SDH now s/p L MMA embolization    PLAN: -Continue supportive care.  -Pt to follow up with Dr. Joshua.  -Call w/ questions/concerns.   Camie Pickle, [facility]

## 2024-02-19 DIAGNOSIS — I951 Orthostatic hypotension: Secondary | ICD-10-CM | POA: Diagnosis not present

## 2024-02-19 NOTE — Discharge Summary (Signed)
 Physician Discharge Summary  Edwin Dickerson FMW:[id number] DOB: 06/10/1950 DOA: 02/15/2024  PCP: Marelyn Axon, MD  Admit date: 02/15/2024 Discharge date: 02/19/2024  Time spent: 40 minutes  Recommendations for Outpatient Follow-up:  Follow outpatient CBC/CMP Follow with neurosurgery outpatient Follow blood pressure/orthostasis outpatient - losartan  was discontinued    Discharge Diagnoses:  Principal Problem:   Orthostatic hypotension Active Problems:   SDH (subdural hematoma) (HCC)   AKI (acute kidney injury) (HCC)   Leukocytosis   Hyponatremia   Discharge Condition: stable  Diet recommendation: heart healthy  Filed Weights   02/17/24 1050  Weight: 90.7 kg    History of present illness:   Edwin Dickerson is Edwin Dickerson 73 y.o. male with medical history significant of  hyperlipidemia, TIA, BPH, class I obesity, left eye retinal detachment status post eye surgery.  Recent subdural hematoma status post craniotomy 02/01/2024 discharged to CIR and finally returned home on the day prior to admission.  Patient presented to the ED today via EMS from home for evaluation of dizziness and hypotension.  CT head showing mixed attenuation subdural collection overlying the left cerebral convexity measuring up to 1.9 cm in maximum thickness, previously 1.6 cm measuring in Edwin Dickerson similar manner.  Associated 5 mm rightward midline shift.  The collection has slightly increased over the left parietal lobe and is otherwise similar to slightly decreased over the left frontal lobe compared to prior study.  Showing nearly complete resolution of previously noted subarachnoid hemorrhage.  In addition, showing slightly improved caliber of the lateral ventricles compared to prior study.   Neurosurgery saw patient and performed MMA embolization for the subdural hematoma.  Stable for discharge today - mild orthostasis present.  Planning for compression stockings, abdominal binder - change positions slowly.     Hospital Course:   Assessment and Plan:  Recent subdural hematoma status post craniotomy 02/01/2024 CT head showing mixed attenuation subdural collection overlying the left cerebral convexity measuring up to 1.9 cm in maximum thickness, previously 1.6 cm measuring in Edwin Dickerson similar manner.  Associated 5 mm rightward midline shift.  The collection has slightly increased over the left parietal lobe and is otherwise similar to slightly decreased over the left frontal lobe compared to prior study.  nearly complete resolution of previously noted subarachnoid hemorrhage.  slightly improved caliber of the lateral ventricles compared to prior study.   Note hx surgeries done on his left eye twice in the past with most recent surgery in mid June of this year for retinal detachment.   S/p MMA embolization 9/19 - follow with neurosurgery outpatient   Orthostatic hypotension Echo with preserved EF, no AS Follow TSH (wnl), cortisol (wnl) Sounds like this was an issue in CIR Hold losartan   Compression stockings, abdominal binder Outpatient therapy   Back Pain  Constipation  Mild Ileus Plain film with possible mild ileus Follow    AKI Resolved Continue to hold losartan    Leukocytosis Chest x-ray not suggestive of pneumonia.  UA not suggestive of infection.  No other obvious infectious source.  No signs of sepsis.  Monitor WBC count outpatient.   Mild hyponatremia Follow outpatient    Mild elevation of liver enzymes Trend - follow outpatient    Hyperlipidemia History of TIA Continue Lipitor.   BPH Continue finasteride .   Obesity Body mass index is 31.32 kg/m.     Procedures: Echo  IMPRESSIONS     1. Left ventricular ejection fraction, by estimation, is 60 to 65%. The  left ventricle has normal function. The left ventricle  has no regional  wall motion abnormalities. There is mild concentric left ventricular  hypertrophy. Left ventricular diastolic  parameters are consistent with Grade I diastolic  dysfunction (impaired  relaxation).   2. Right ventricular systolic function is normal. The right ventricular  size is normal. There is normal pulmonary artery systolic pressure.   3. The mitral valve is normal in structure. Trivial mitral valve  regurgitation. No evidence of mitral stenosis.   4. The aortic valve is tricuspid. Aortic valve regurgitation is not  visualized. No aortic stenosis is present.   5. The inferior vena cava is normal in size with greater than 50%  respiratory variability, suggesting right atrial pressure of 3 mmHg.  9/19 IMPRESSION:  1. 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  Consultations: nsgy  Discharge Exam: Vitals:   02/19/24 0847 02/19/24 1112  BP: 128/74 133/82  Pulse: 70 66  Resp: 19 19  Temp: 99 F (37.2 C) 98.1 F (36.7 C)  SpO2: 96% 98%   Feeling well Eager to go home today Some LH with therapy, but not bad, knows he needs to change positions slowly  General: No acute distress. Cardiovascular: Heart sounds show Edwin Dickerson regular rate, and rhythm. Lungs: unlabored Neurological: Alert and oriented 3. Moves all extremities 4 with equal strength. Cranial nerves II through XII grossly intact. Extremities: No clubbing or cyanosis. No edema.  Discharge Instructions   Discharge Instructions     Call MD for:  difficulty breathing, headache or visual disturbances   Complete by: As directed    Call MD for:  extreme fatigue   Complete by: As directed    Call MD for:  hives   Complete by: As directed    Call MD for:  persistant dizziness or light-headedness   Complete by: As directed    Call MD for:  persistant nausea and vomiting   Complete by: As directed    Call MD for:  redness, tenderness, or signs of infection (pain, swelling, redness, odor or green/yellow discharge around incision site)   Complete by: As directed    Call MD for:  severe uncontrolled pain   Complete by: As directed     Call MD for:  temperature >100.4   Complete by: As directed    Diet - low sodium heart healthy   Complete by: As directed    Discharge instructions   Complete by: As directed    You were seen for Edwin Dickerson subdural hematoma.  You've now had an embolization of the left middle meningeal artery for your chronic subdural hematoma.  Follow with Dr. Joshua from neurosurgery as an outpatient.    You have issues with Juris Gosnell drop in your blood pressure when you change positions.  You'll need to change positions slowly.  Wear your compression stockings and abdominal binder when you're up and about.  Stop your losartan .   Follow with physical therapy outpatient.   Return for new, recurrent, or worsening symptoms.  Please ask your PCP to request records from this hospitalization so they know what was done and what the next steps will be.   Discharge wound care:   Complete by: As directed    Per neurosurgery   Increase activity slowly   Complete by: As directed       Allergies as of 02/19/2024       Reactions   Penicillins Hives, Rash, Other (See Comments)   Syncope         Medication  List     STOP taking these medications    losartan  25 MG tablet Commonly known as: COZAAR        TAKE these medications    acetaminophen  325 MG tablet Commonly known as: TYLENOL  Take 2 tablets (650 mg total) by mouth every 4 (four) hours as needed for mild pain (pain score 1-3) (temp > 100.5).   atorvastatin  40 MG tablet Commonly known as: LIPITOR Take 1 tablet (40 mg total) by mouth daily at 6 PM.   B-complex with vitamin C tablet Take 1 tablet by mouth daily.   cetirizine 10 MG tablet Commonly known as: ZYRTEC Take 10 mg by mouth daily as needed for allergies.   clonazePAM  0.5 MG tablet Commonly known as: KLONOPIN  Take 1 tablet (0.5 mg total) by mouth at bedtime.   docusate sodium  100 MG capsule Commonly known as: COLACE Take 1 capsule (100 mg total) by mouth daily.   finasteride  5 MG  tablet Commonly known as: PROSCAR  Take 1 tablet (5 mg total) by mouth daily.   Mag-G 500 (27 Mg) MG Tabs tablet Generic drug: magnesium  gluconate Take 0.5 tablets (250 mg total) by mouth daily.   scopolamine  1 MG/3DAYS Commonly known as: TRANSDERM-SCOP Place 1 patch (1 mg total) onto the skin every 3 (three) days. What changed: additional instructions   vitamin D3 25 MCG tablet Commonly known as: CHOLECALCIFEROL  Take 1 tablet (1,000 Units total) by mouth daily.               Discharge Care Instructions  (From admission, onward)           Start     Ordered   02/19/24 0000  Discharge wound care:       Comments: Per neurosurgery   02/19/24 1409           Allergies  Allergen Reactions   Penicillins Hives, Rash and Other (See Comments)    Syncope       The results of significant diagnostics from this hospitalization (including imaging, microbiology, ancillary and laboratory) are listed below for reference.    Significant Diagnostic Studies: ECHOCARDIOGRAM COMPLETE Result Date: 02/16/2024    ECHOCARDIOGRAM REPORT   Patient Name:   Adelbert Pagliaro Date of Exam: 02/16/2024 Medical Rec #:  [id number]     Height:       67.0 in Accession #:    [id number]    Weight:       194.9 lb Date of Birth:  10/02/1950     BSA:          2.000 m Patient Age:    73 years      BP:           126/73 mmHg Patient Gender: M             HR:           68 bpm. Exam Location:  Inpatient Procedure: 2D Echo, Cardiac Doppler and Color Doppler (Both Spectral and Color            Flow Doppler were utilized during procedure). Indications:    Dizziness  History:        Patient has prior history of Echocardiogram examinations, most                 recent 10/24/2016. TIA, Signs/Symptoms:Hypotension and                 Dizziness/Lightheadedness; Risk Factors:Hypertension and  Non-Smoker.  Sonographer:    Juliene Rucks Referring Phys: [phone] Fable Huisman CALDWELL POWELL JR IMPRESSIONS  1. Left ventricular  ejection fraction, by estimation, is 60 to 65%. The left ventricle has normal function. The left ventricle has no regional wall motion abnormalities. There is mild concentric left ventricular hypertrophy. Left ventricular diastolic parameters are consistent with Grade I diastolic dysfunction (impaired relaxation).  2. Right ventricular systolic function is normal. The right ventricular size is normal. There is normal pulmonary artery systolic pressure.  3. The mitral valve is normal in structure. Trivial mitral valve regurgitation. No evidence of mitral stenosis.  4. The aortic valve is tricuspid. Aortic valve regurgitation is not visualized. No aortic stenosis is present.  5. The inferior vena cava is normal in size with greater than 50% respiratory variability, suggesting right atrial pressure of 3 mmHg. FINDINGS  Left Ventricle: Left ventricular ejection fraction, by estimation, is 60 to 65%. The left ventricle has normal function. The left ventricle has no regional wall motion abnormalities. The left ventricular internal cavity size was normal in size. There is  mild concentric left ventricular hypertrophy. Left ventricular diastolic parameters are consistent with Grade I diastolic dysfunction (impaired relaxation). Indeterminate filling pressures. Right Ventricle: The right ventricular size is normal. No increase in right ventricular wall thickness. Right ventricular systolic function is normal. There is normal pulmonary artery systolic pressure. The tricuspid regurgitant velocity is 1.44 m/s, and  with an assumed right atrial pressure of 3 mmHg, the estimated right ventricular systolic pressure is 11.3 mmHg. Left Atrium: Left atrial size was normal in size. Right Atrium: Right atrial size was normal in size. Pericardium: There is no evidence of pericardial effusion. Mitral Valve: The mitral valve is normal in structure. Trivial mitral valve regurgitation. No evidence of mitral valve stenosis. Tricuspid Valve:  The tricuspid valve is normal in structure. Tricuspid valve regurgitation is trivial. No evidence of tricuspid stenosis. Aortic Valve: The aortic valve is tricuspid. Aortic valve regurgitation is not visualized. No aortic stenosis is present. Aortic valve mean gradient measures 5.0 mmHg. Aortic valve peak gradient measures 9.0 mmHg. Aortic valve area, by VTI measures 2.99 cm. Pulmonic Valve: The pulmonic valve was normal in structure. Pulmonic valve regurgitation is not visualized. No evidence of pulmonic stenosis. Aorta: The aortic root is normal in size and structure. Venous: The inferior vena cava is normal in size with greater than 50% respiratory variability, suggesting right atrial pressure of 3 mmHg. IAS/Shunts: No atrial level shunt detected by color flow Doppler.  LEFT VENTRICLE PLAX 2D LVIDd:         4.70 cm      Diastology LVIDs:         3.10 cm      LV e' medial:    5.55 cm/s LV PW:         1.20 cm      LV E/e' medial:  15.2 LV IVS:        1.20 cm      LV e' lateral:   9.68 cm/s LVOT diam:     1.90 cm      LV E/e' lateral: 8.7 LV SV:         84 LV SV Index:   42 LVOT Area:     2.84 cm  LV Volumes (MOD) LV vol d, MOD A2C: 105.0 ml LV vol d, MOD A4C: 97.0 ml LV vol s, MOD A2C: 41.3 ml LV vol s, MOD A4C: 36.2 ml LV SV MOD A2C:  63.7 ml LV SV MOD A4C:     97.0 ml LV SV MOD BP:      62.7 ml RIGHT VENTRICLE RV Basal diam:  3.40 cm RV Mid diam:    2.70 cm RV S prime:     16.30 cm/s TAPSE (M-mode): 1.9 cm LEFT ATRIUM             Index        RIGHT ATRIUM           Index LA diam:        3.30 cm 1.65 cm/m   RA Area:     13.80 cm LA Vol (A2C):   41.2 ml 20.60 ml/m  RA Volume:   30.90 ml  15.45 ml/m LA Vol (A4C):   50.9 ml 25.45 ml/m LA Biplane Vol: 48.7 ml 24.35 ml/m  AORTIC VALVE AV Area (Vmax):    3.06 cm AV Area (Vmean):   3.07 cm AV Area (VTI):     2.99 cm AV Vmax:           150.00 cm/s AV Vmean:          99.800 cm/s AV VTI:            0.283 m AV Peak Grad:      9.0 mmHg AV Mean Grad:      5.0  mmHg LVOT Vmax:         162.00 cm/s LVOT Vmean:        108.000 cm/s LVOT VTI:          0.298 m LVOT/AV VTI ratio: 1.05  AORTA Ao Root diam: 3.30 cm MITRAL VALVE                TRICUSPID VALVE MV Area (PHT): 3.21 cm     TR Peak grad:   8.3 mmHg MV Decel Time: 236 msec     TR Vmax:        144.00 cm/s MV E velocity: 84.30 cm/s MV Tiarra Anastacio velocity: 105.00 cm/s  SHUNTS MV E/Moraima Burd ratio:  0.80         Systemic VTI:  0.30 m                             Systemic Diam: 1.90 cm Annabella [facility] MD Electronically signed by Annabella [facility] MD Signature Date/Time: 02/16/2024/8:37:08 PM    Final    DG Abd 1 View Result Date: 02/16/2024 EXAM: 1 VIEW XRAY OF THE ABDOMEN 02/16/2024 04:41:00 PM COMPARISON: None available. CLINICAL HISTORY: Constipation \\T \ pain FINDINGS: BOWEL: No pathologic dilatation of the large or small bowel loops. Diffuse air-filled loops of small bowel measure up to 2.9 cm with gas seen throughout the colon up to the rectum. Findings are nonspecific but may be seen with mild ileus or enteritis. SOFT TISSUES: No opaque urinary calculi. BONES: No acute osseous abnormality. IMPRESSION: 1. No bowel obstruction. 2. Findings nonspecific but may be seen with mild ileus or enteritis. Electronically signed by: Waddell Calk MD 02/16/2024 05:06 PM EDT RP Workstation: [id number]   CT Head Wo Contrast Result Date: 02/15/2024 EXAM: CT HEAD WITHOUT CONTRAST 02/15/2024 05:44:00 PM TECHNIQUE: CT of the head was performed without the administration of intravenous contrast. Automated exposure control, iterative reconstruction, and/or weight based adjustment of the mA/kV was utilized to reduce the radiation dose to as low as reasonably achievable. COMPARISON: CT head 02/03/2024. CLINICAL HISTORY: Headache, increasing frequency or severity. Dizziness. Pt BIB [facility] from home due to dizziness. Pt was discharged yesterday from here after shunt placement on top of his head; pt had fall two weeks ago. Pt initial BP 80/60; after 1L  bolus; 110/89. FINDINGS: BRAIN AND VENTRICLES: Postsurgical changes of left frontal burr hole and craniotomy for evacuation of underlying subdural hematoma. There is Lillian Tigges mixed attenuation subdural collection overlying the left cerebral convexity, which now measures up to 1.9 cm in maximum thickness on coronal images. The collection previously measured up to 1.6 cm on the prior study when measuring in Gessica Jawad similar manner. Regions of hyperattenuating blood products are significantly decreased compared to prior. There is primarily isoattenuating to slightly hypoattenuating appearance of the subdural collection. Overall the subdural hematoma appears slightly increased overlying the left parietal lobe but is otherwise similar to slightly decreased over the left frontal lobe. There is approximately 5 mm rightward midline shift previously measuring 4 mm. The caliber of the lateral ventricles appears slightly improved compared to prior. There is trace subdural hemorrhage along the posterior falx and left tentorium. Previously noted subarachnoid hemorrhage, particularly along the left sylvian fissure, is nearly completely resolved. Significant interval decrease in pneumocephalus. ORBITS: Bilateral lens replacement. SINUSES: No acute abnormality. SOFT TISSUES AND SKULL: There is slightly decreased soft tissue swelling and soft tissue gas at the surgical site in the left frontal scalp. IMPRESSION: 1. Mixed attenuation subdural collection overlying the left cerebral convexity, measuring up to 1.9 cm in maximum thickness, previously 1.6 cm measuring in Mackayla Mullins similar manner. Associated 5 mm rightward midline shift. The collection has slightly increased over the left parietal lobe and is otherwise similar to slightly decreased over the left frontal lobe compared to prior study. 2. Nearly complete resolution of previously noted subarachnoid hemorrhage. 3. Slightly improved caliber of the lateral ventricles compared to prior study.  Electronically signed by: Donnice Mania MD 02/15/2024 06:26 PM EDT RP Workstation: [id number]   DG Chest Portable 1 View Result Date: 02/15/2024 CLINICAL DATA:  weakness EXAM: PORTABLE CHEST 1 VIEW COMPARISON:  Chest x-ray 04/06/2020 FINDINGS: The heart and mediastinal contours are unchanged. Atherosclerotic plaque. No focal consolidation. No pulmonary edema. No pleural effusion. No pneumothorax. No acute osseous abnormality. IMPRESSION: 1. No active disease. 2.  Aortic Atherosclerosis (ICD10-I70.0). Electronically Signed   By: Morgane  Naveau M.D.   On: 02/15/2024 18:00   CT HEAD WO CONTRAST ( ) Result Date: 02/03/2024 CLINICAL DATA:  Subdural hematoma EXAM: CT HEAD WITHOUT CONTRAST TECHNIQUE: Contiguous axial images were obtained from the base of the skull through the vertex without intravenous contrast. RADIATION DOSE REDUCTION: This exam was performed according to the departmental dose-optimization program which includes automated exposure control, adjustment of the mA and/or kV according to patient size and/or use of iterative reconstruction technique. COMPARISON:  02/02/2024 FINDINGS: There has been Cannon Quinton left frontal burr-hole. There is left cerebral convexity subdural hematoma/air with Dayln Tugwell maximum thickness of approximately 10 mm. There is Shae Augello 4 mm left-to-right midline shift and slight depression of the left lateral ventricle. There is Mikaelyn Arthurs small amount of subarachnoid hemorrhage in the sylvian fissure and over the frontal convexity which is unchanged. IMPRESSION: Postoperative changes from evacuation of left subdural hematoma. The subdural hematoma overall appears Orlander Norwood little smaller compared with yesterday. The 4 mm midline shift is unchanged. No change in subarachnoid hemorrhage. Electronically Signed   By: Nancyann Burns M.D.   On: 02/03/2024 12:22   CT HEAD WO CONTRAST Result Date: 02/02/2024 EXAM: CT HEAD WITHOUT CONTRAST 02/02/2024 06:21:21 AM TECHNIQUE: CT of the head  was performed without the  administration of intravenous contrast. Automated exposure control, iterative reconstruction, and/or weight based adjustment of the mA/kV was utilized to reduce the radiation dose to as low as reasonably achievable. COMPARISON: CT of the head dated 02/01/2024. CLINICAL HISTORY: Subdural hematoma. FINDINGS: BRAIN AND VENTRICLES: Since the previous study, the patient has undergone left frontoparietal craniotomy for evacuation of Preslei Blakley left subdural hematoma. Rakiyah Esch drainage catheter has been placed in the interim. There is new subarachnoid and subdural hemorrhage present. Overall, the subdural hematoma is slightly decreased in maximal thickness on the coronal images from 22 mm to 20 mm. There has been mild interval improvement of shift of midline structures to the right, now measuring 4 mm. Previously the midline shift measured up to 8 mm. ORBITS: No acute abnormality. SINUSES: No acute abnormality. SOFT TISSUES AND SKULL: No acute soft tissue abnormality. Postoperative changes of left frontoparietal craniotomy. IMPRESSION: 1. Status post left frontoparietal craniotomy for evacuation of Chaitanya Amedee left subdural hematoma with placement of Nina Hoar drainage catheter. 2. New subarachnoid and subdural hemorrhage. 3. Slight decrease in maximal thickness of the subdural hematoma from 22 mm to 20 mm. 4. Mild interval improvement of shift of midline structures to the right, now measuring 4 mm (previously 8 mm). Electronically signed by: Evalene Coho MD 02/02/2024 06:50 AM EDT RP Workstation: [id number]   CT HEAD WO CONTRAST Addendum Date: 02/01/2024 ADDENDUM REPORT: 02/01/2024 17:18 ADDENDUM: Impression #1 called by telephone on 02/01/2024 at 5:17 pm to provider Dr. Neysa, who verbally acknowledged these results. Electronically Signed   By: Rockey Childs D.O.   On: 02/01/2024 17:18   Result Date: 02/01/2024 CLINICAL DATA:  Provided history: Neuro deficit, acute, stroke suspected. Right-sided weakness. Slurred speech. Right-sided facial droop.  EXAM: CT HEAD WITHOUT CONTRAST TECHNIQUE: Contiguous axial images were obtained from the base of the skull through the vertex without intravenous contrast. RADIATION DOSE REDUCTION: This exam was performed according to the departmental dose-optimization program which includes automated exposure control, adjustment of the mA and/or kV according to patient size and/or use of iterative reconstruction technique. COMPARISON:  Brain MRI 10/23/2016. FINDINGS: Brain: Mild generalized cerebral atrophy. Mixed density subdural hematoma centered along the left frontal convexity (measuring up to 2.3 cm in thickness). The subdural hematoma contains low-density, intermediate density and hyperdense components. Mass effect upon the underlying brain parenchyma with 8 mm rightward midline shift. The basal cisterns remain patent. No demarcated cortical infarct. No evidence of an intracranial mass. Vascular: No hyperdense vessel.  Atherosclerotic calcifications. Skull: No calvarial fracture or aggressive osseous lesion. Sinuses/Orbits: No mass or acute finding within the imaged orbits. No significant paranasal sinus disease at the imaged levels. Attempts are being made to reach the ordering provider at this time. IMPRESSION: 1. Mixed density subdural hematoma centered along the left frontal convexity (measuring up to 2.3 mm in thickness). Mass effect upon the underlying brain parenchyma with 8 mm rightward midline shift. 2. Mild cerebral atrophy. Electronically Signed: By: Rockey Childs D.O. On: 02/01/2024 17:13    Microbiology: No results found for this or any previous visit (from the past 240 hours).   Labs: Basic Metabolic Panel: Recent Labs  Lab 02/13/24 0610 02/15/24 1711 02/15/24 1718 02/16/24 0436 02/18/24 0557  NA 134* 132* 133* 136 132*  K 4.7 3.8 3.8 4.0 4.5  CL 104 100 100 102 99  CO2 20* 20*  --  23 24  GLUCOSE 132* 90 91 88 133*  BUN 29* 29* 33* 22 15  CREATININE 1.02 1.39* 1.50*  1.29* 0.94  CALCIUM   8.7* 7.9*  --  8.1* 8.4*   Liver Function Tests: Recent Labs  Lab 02/15/24 1711 02/16/24 0436  AST 36 27  ALT 63* 54*  ALKPHOS 80 63  BILITOT 1.8* 1.9*  PROT 5.7* 5.1*  ALBUMIN 3.0* 2.9*   No results for input(s): LIPASE, AMYLASE in the last 168 hours. No results for input(s): AMMONIA in the last 168 hours. CBC: Recent Labs  Lab 02/15/24 1711 02/15/24 1718 02/16/24 0436 02/18/24 0557  WBC 17.8*  --  12.4* 12.6*  NEUTROABS 17.3*  --   --   --   HGB 14.7 13.9 13.7 13.0  HCT 43.2 41.0 39.8 37.0*  MCV 90.6  --  89.4 87.9  PLT 302  --  221 207   Cardiac Enzymes: No results for input(s): CKTOTAL, CKMB, CKMBINDEX, TROPONINI in the last 168 hours. BNP: BNP (last 3 results) No results for input(s): BNP in the last 8760 hours.  ProBNP (last 3 results) No results for input(s): PROBNP in the last 8760 hours.  CBG: Recent Labs  Lab 02/15/24 2119 02/16/24 0826 02/17/24 0735  GLUCAP 86 91 131*       Signed:  Meliton Monte MD.  [facility] 02/19/2024, 2:09 PM

## 2024-02-19 NOTE — Evaluation (Signed)
 Occupational Therapy Evaluation Patient Details Name: Edwin Dickerson MRN: [id number] DOB: 1950-07-17 Today's Date: 02/19/2024   History of Present Illness   Pt is a 73 y/o M presenting to ED On 9/17 from home after recently discharging from CIR with dizziness and hypotension. PMH includes HLD, TIA, BPH, class 1 obesity, L eye retinal detachment, subdural hematoma s/p craniotomy s/p L MMA embolization.     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 Pt currently needing supervision-min A for ADLs, supervision for bed mobility and supervision for transfers with and without AD. Pt with mild dizziness and reports blurred vision at baseline. Reports wearing compression stockings at rehab, and they are at home, reached out to RN/MD to order additional pair. Pt presenting with impairments listed below, will follow acutely. Recommend OP OT at d/c.   BP supine 105/75 (84) BP seated 96/70 (78) BP standing 96/63 (75) BP standing x5 min 100/71 (80)     If plan is discharge home, recommend the following:   A little help with walking and/or transfers;A little help with bathing/dressing/bathroom;Assistance with cooking/housework;Assist for transportation     Functional Status Assessment   Patient has had a recent decline in their functional status and demonstrates the ability to make significant improvements in function in a reasonable and predictable amount of time.     Equipment Recommendations   None recommended by OT     Recommendations for Other Services   PT consult     Precautions/Restrictions   Precautions Precautions: Fall Recall of Precautions/Restrictions: Intact Restrictions Weight Bearing Restrictions Per Provider Order: No     Mobility Bed Mobility Overal bed mobility: Needs Assistance Bed Mobility: Supine to Sit, Sit to Supine     Supine to sit:  Supervision Sit to supine: Supervision        Transfers Overall transfer level: Needs assistance Equipment used: Rolling walker (2 wheels), None Transfers: Sit to/from Stand, Bed to chair/wheelchair/BSC Sit to Stand: Supervision                  Balance Overall balance assessment: Needs assistance Sitting-balance support: Feet supported Sitting balance-Leahy Scale: Good     Standing balance support: Single extremity supported Standing balance-Leahy Scale: Fair                             ADL either performed or assessed with clinical judgement   ADL Overall ADL's : Needs assistance/impaired Eating/Feeding: Independent   Grooming: Supervision/safety   Upper Body Bathing: Supervision/ safety   Lower Body Bathing: Minimal assistance   Upper Body Dressing : Supervision/safety   Lower Body Dressing: Minimal assistance   Toilet Transfer: Supervision/safety   Toileting- Clothing Manipulation and Hygiene: Supervision/safety       Functional mobility during ADLs: Supervision/safety       Vision Patient Visual Report: Blurring of vision Additional Comments: hx L eye retinal detachment, blurred vision at baseline     Perception         Praxis         Pertinent Vitals/Pain Pain Assessment Pain Assessment: No/denies pain     Extremity/Trunk Assessment Upper Extremity Assessment Upper Extremity Assessment: Overall WFL for tasks assessed;Left hand dominant   Lower Extremity Assessment Lower Extremity Assessment: Defer to PT evaluation   Cervical / Trunk Assessment Cervical / Trunk Assessment: Normal  Communication     Cognition Arousal: Alert Behavior During Therapy: WFL for tasks assessed/performed Cognition: Cognition impaired     Awareness: Online awareness impaired Memory impairment (select all impairments): Short-term memory Attention impairment (select first level of impairment): Selective attention   OT - Cognition  Comments: at times some wordfinding difficulties and ?HOH                 Following commands: Intact       Cueing  General Comments   Cueing Techniques: Verbal cues;Gestural cues  see note for BP measures   Exercises     Shoulder Instructions      Home Living Family/patient expects to be discharged to:: Private residence Living Arrangements: Spouse/significant other Available Help at Discharge: Family;Available 24 hours/day Type of Home: House Home Access: Stairs to enter [employer] of Steps: 5 Entrance Stairs-Rails: Left Home Layout: Two level;Able to live on main level with bedroom/bathroom     Bathroom Shower/Tub: Chief Strategy Officer: Standard Bathroom Accessibility: Yes How Accessible: Accessible via walker Home Equipment: Rolling Walker (2 wheels);BSC/3in1;Shower seat          Prior Functioning/Environment Prior Level of Function : Independent/Modified Independent;Driving             Mobility Comments: Ind with no AD after CIR ADLs Comments: Ind    OT Problem List: Decreased activity tolerance;Impaired balance (sitting and/or standing);Decreased coordination;Decreased knowledge of precautions;Decreased safety awareness;Impaired UE functional use   OT Treatment/Interventions: Self-care/ADL training;Therapeutic activities;Cognitive remediation/compensation;Patient/family education;Balance training;DME and/or AE instruction      OT Goals(Current goals can be found in the care plan section)   Acute Rehab OT Goals Patient Stated Goal: none stated OT Goal Formulation: With patient Time For Goal Achievement: 03/01/24 Potential to Achieve Goals: Good ADL Goals Pt Will Perform Lower Body Dressing: with modified independence;sitting/lateral leans;sit to/from stand Pt Will Perform Tub/Shower Transfer: Tub transfer;Shower transfer;ambulating;with modified independence Additional ADL Goal #1: Pt will tolerate OOB standing  activity x15 min with BP stable in order to improve activity tolerance for ADLs   OT Frequency:  Min 2X/week    Co-evaluation PT/OT/SLP Co-Evaluation/Treatment: Yes Reason for Co-Treatment: For patient/therapist safety PT goals addressed during session: Mobility/safety with mobility;Balance;Proper use of DME OT goals addressed during session: ADL's and self-care;Strengthening/ROM      AM-PAC OT 6 Clicks Daily Activity     Outcome Measure Help from another person eating meals?: A Little Help from another person taking care of personal grooming?: A Little Help from another person toileting, which includes using toliet, bedpan, or urinal?: A Little Help from another person bathing (including washing, rinsing, drying)?: A Little Help from another person to put on and taking off regular upper body clothing?: A Little Help from another person to put on and taking off regular lower body clothing?: A Little 6 Click Score: 18   End of Session Equipment Utilized During Treatment: Gait belt Nurse Communication: Mobility status  Activity Tolerance: Patient tolerated treatment well Patient left: in chair;with call bell/phone within reach;with chair alarm set  OT Visit Diagnosis: Unsteadiness on feet (R26.81);Cognitive communication deficit (R41.841) Symptoms and signs involving cognitive functions: Nontraumatic SAH                Time: 9094-9064 OT Time Calculation (min): 30 min Charges:  OT General Charges $OT Visit: 1 Visit OT Evaluation $OT Eval Moderate Complexity: 1 Mod  Pattye Meda K, OTD, OTR/L SecureChat [facility] [phone]   Laneta K  Koonce 02/19/2024, 10:19 AM

## 2024-02-19 NOTE — Evaluation (Signed)
 Physical Therapy Evaluation Patient Details Name: Edwin Dickerson MRN: [id number] DOB: [birth year] Today's Date: 02/19/2024  History of Present Illness  73 y.o. male presents to [facility] 02/15/24 from home after recent d/c from CIR with dizziness and hypotension. Pt underwent craniotomy for evacuation for chronic SDH two weeks prior. 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 PMHx: hyperlipidemia, TIA, BPH, class I obesity, left eye retinal detachment status post eye surgery.  Recent subdural hematoma status post craniotomy 02/01/2024   Clinical Impression  Pt recently discharged to home from CIR and was ambulating with no AD in the home. Pt would wear compression stockings at rehab and home, however, were not available during session. Required supervision for all mobility in today's session with pt able to ambulate 367ft and negotiate three steps with bilateral handrails. Reported blurred vision at baseline with slight dizziness today, vitals below. Notified RN/MD recommendation to order additional pair of compression stockings. Pt has 24/7 level of assist available at home. Recommending post-acute OP PT to work towards independence with mobility. Pt would benefit from acute skilled PT with current functional limitations listed below (see PT Problem List). Acute PT to follow.    BP Readings BP supine 105/75 (84) BP seated 96/70 (78) BP standing 96/63 (75) BP standing x5 min 100/71 (80    If plan is discharge home, recommend the following: A little help with walking and/or transfers;Assistance with cooking/housework;Assist for transportation;Help with stairs or ramp for entrance;A little help with bathing/dressing/bathroom   Can travel by private vehicle    Yes    Equipment Recommendations None recommended by PT     Functional Status Assessment Patient has had a recent decline in their functional status and demonstrates the ability to make  significant improvements in function in a reasonable and predictable amount of time.     Precautions / Restrictions Precautions Precautions: Fall Recall of Precautions/Restrictions: Intact Precaution/Restrictions Comments: hx of orthostatic hypotension Restrictions Weight Bearing Restrictions Per Provider Order: No      Mobility  Bed Mobility Overal bed mobility: Needs Assistance Bed Mobility: Supine to Sit, Sit to Supine    Supine to sit: Supervision Sit to supine: Supervision      Transfers Overall transfer level: Needs assistance Equipment used: Rolling walker (2 wheels), None Transfers: Sit to/from Stand, Bed to chair/wheelchair/BSC Sit to Stand: Supervision    General transfer comment: with no AD and RW    Ambulation/Gait Ambulation/Gait assistance: Supervision Gait Distance (Feet): 300 Feet (x50 w/ RW, x250 w/ no AD) Assistive device: Rolling walker (2 wheels), None Gait Pattern/deviations: Step-through pattern, Decreased stride length Gait velocity: decreased     General Gait Details: R foot positioned in ER (reports is baseline), no overt LOB during gait.  Stairs Stairs: Yes Stairs assistance: Contact guard assist Stair Management: One rail Right, One rail Left, Step to pattern Number of Stairs: 3 General stair comments: steady with use of bilateral handrails, CGA for safety    Balance Overall balance assessment: Needs assistance Sitting-balance support: Feet supported Sitting balance-Leahy Scale: Good     Standing balance support: Single extremity supported Standing balance-Leahy Scale: Fair       Pertinent Vitals/Pain Pain Assessment Pain Assessment: No/denies pain    Home Living Family/patient expects to be discharged to:: Private residence Living Arrangements: Spouse/significant other Available Help at Discharge: Family;Available 24 hours/day Type of Home: House Home Access: Stairs to enter Entrance Stairs-Rails: Left Entrance  Stairs-Number of Steps: 5   Home  Layout: Two level;Able to live on main level with bedroom/bathroom Home Equipment: Rolling Walker (2 wheels);BSC/3in1;Shower seat      Prior Function Prior Level of Function : Independent/Modified Independent;Driving    Mobility Comments: Ind with no AD after CIR ADLs Comments: Ind     Extremity/Trunk Assessment   Upper Extremity Assessment Upper Extremity Assessment: Defer to OT evaluation    Lower Extremity Assessment Lower Extremity Assessment: Defer to PT evaluation RLE Coordination: decreased gross motor    Cervical / Trunk Assessment Cervical / Trunk Assessment: Normal  Communication   Communication Communication: No apparent difficulties    Cognition Arousal: Alert Behavior During Therapy: WFL for tasks assessed/performed   PT - Cognitive impairments: No family/caregiver present to determine baseline    Following commands: Intact       Cueing Cueing Techniques: Verbal cues, Gestural cues     General Comments General comments (skin integrity, edema, etc.): see note for BP measures     PT Assessment Patient needs continued PT services  PT Problem List Decreased strength;Decreased balance;Decreased safety awareness;Decreased coordination;Decreased activity tolerance;Decreased mobility       PT Treatment Interventions Gait training;Stair training;Functional mobility training;Therapeutic activities;Therapeutic exercise;Neuromuscular re-education;Patient/family education;DME instruction    PT Goals (Current goals can be found in the Care Plan section)  Acute Rehab PT Goals Patient Stated Goal: go home and get more therapy PT Goal Formulation: With patient Time For Goal Achievement: 03/04/24 Potential to Achieve Goals: Good    Frequency Min 2X/week     Co-evaluation   Reason for Co-Treatment: For patient/therapist safety PT goals addressed during session: Mobility/safety with mobility;Balance;Proper use of DME OT  goals addressed during session: ADL's and self-care;Strengthening/ROM       AM-PAC PT 6 Clicks Mobility  Outcome Measure Help needed turning from your back to your side while in a flat bed without using bedrails?: A Little Help needed moving from lying on your back to sitting on the side of a flat bed without using bedrails?: A Little Help needed moving to and from a bed to a chair (including a wheelchair)?: A Little Help needed standing up from a chair using your arms (e.g., wheelchair or bedside chair)?: A Little Help needed to walk in hospital room?: A Little Help needed climbing 3-5 steps with a railing? : A Little 6 Click Score: 18    End of Session Equipment Utilized During Treatment: Gait belt Activity Tolerance: Patient tolerated treatment well Patient left: in bed;with call bell/phone within reach;with bed alarm set Nurse Communication: Mobility status (need for ted hose) PT Visit Diagnosis: Other abnormalities of gait and mobility (R26.89);Muscle weakness (generalized) (M62.81)    Time: 9093-9064 PT Time Calculation (min) (ACUTE ONLY): 29 min   Charges:   PT Evaluation $PT Eval Low Complexity: 1 Low   PT General Charges $$ ACUTE PT VISIT: 1 Visit       Kate ORN, PT, DPT Secure Chat Preferred  Rehab Office [phone]  Kate BRAVO Wendolyn 02/19/2024, 11:55 AM

## 2024-02-20 ENCOUNTER — Encounter (HOSPITAL_BASED_OUTPATIENT_CLINIC_OR_DEPARTMENT_OTHER): Payer: Self-pay

## 2024-02-20 ENCOUNTER — Ambulatory Visit (HOSPITAL_BASED_OUTPATIENT_CLINIC_OR_DEPARTMENT_OTHER)
Admission: EM | Admit: 2024-02-20 | Discharge: 2024-02-20 | Disposition: A | Discharge status: Home / Self Care | Attending: Family Medicine | Admitting: Family Medicine

## 2024-02-20 ENCOUNTER — Ambulatory Visit (HOSPITAL_BASED_OUTPATIENT_CLINIC_OR_DEPARTMENT_OTHER): Payer: Self-pay

## 2024-02-20 DIAGNOSIS — R102 Pelvic and perineal pain: Secondary | ICD-10-CM | POA: Diagnosis present

## 2024-02-20 DIAGNOSIS — R350 Frequency of micturition: Secondary | ICD-10-CM | POA: Insufficient documentation

## 2024-02-20 DIAGNOSIS — M545 Low back pain, unspecified: Secondary | ICD-10-CM | POA: Diagnosis present

## 2024-02-20 LAB — POCT URINE DIPSTICK
Bilirubin, UA: NEGATIVE
Glucose, UA: NEGATIVE mg/dL
Ketones, POC UA: NEGATIVE mg/dL
Leukocytes, UA: NEGATIVE
Nitrite, UA: NEGATIVE
Protein Ur, POC: NEGATIVE mg/dL
Spec Grav, UA: 1.015 (ref 1.010–1.025)
Urobilinogen, UA: 2 U/dL — AB
pH, UA: 7 (ref 5.0–8.0)

## 2024-02-20 NOTE — ED Provider Notes (Signed)
 [facility]    CSN: [id number] Arrival date & time: 02/20/24  0805      History   Chief Complaint Chief Complaint  Patient presents with   Urinary Frequency    HPI Edwin Dickerson is a 73 y.o. male.   Pt c/o urinary frequency that started last night. He was also having low back pain and pelvic pain that has stopped since coming into the office. Pt is also having hesitancy but is on meds to help.  Pt denies dysuria or hematuria. Pt just got out of the hospital yesterday and has been in the hospital since 02/01/24 due to a subdural hematoma. While in the hospital he did use a condom cath for some of his stay. No fever    Urinary Frequency    Past Medical History:  Diagnosis Date   High cholesterol    TIA (transient ischemic attack)     Patient Active Problem List   Diagnosis Date Noted   Orthostatic hypotension 02/15/2024   AKI (acute kidney injury) (HCC) 02/15/2024   Leukocytosis 02/15/2024   Hyponatremia 02/15/2024   Traumatic subdural hematoma (HCC) 02/06/2024   S/P craniotomy 02/01/2024   SDH (subdural hematoma) (HCC) 02/01/2024   Hypertension 02/01/2024   Hyperlipidemia 02/01/2024   Routine adult health maintenance 12/07/2016   Vision changes    Word finding difficulty    Elevated blood pressure reading    Benign prostatic hyperplasia with lower urinary tract symptoms    Chronic nonintractable headache    TIA (transient ischemic attack) 10/22/2016   Blurred vision, bilateral    Slurred speech    Transient cerebral ischemia     Past Surgical History:  Procedure Laterality Date   CRANIOTOMY Left 02/01/2024   Procedure: CRANIOTOMY HEMATOMA EVACUATION SUBDURAL;  Surgeon: Joshua Alm Hamilton, MD;  Location: [facility] OR;  Service: Neurosurgery;  Laterality: Left;   PARS PLANA REPAIR OF RETINAL DEATACHMENT         Home Medications    Prior to Admission medications   Medication Sig Start Date End Date Taking? Authorizing Provider  acetaminophen  (TYLENOL )  325 MG tablet Take 2 tablets (650 mg total) by mouth every 4 (four) hours as needed for mild pain (pain score 1-3) (temp > 100.5). 02/07/24   Angiulli, Toribio PARAS, PA-C  atorvastatin  (LIPITOR) 40 MG tablet Take 1 tablet (40 mg total) by mouth daily at 6 PM. 02/13/24   Angiulli, Toribio PARAS, PA-C  B Complex-C (B-COMPLEX WITH VITAMIN C) tablet Take 1 tablet by mouth daily. 02/13/24   Angiulli, Toribio PARAS, PA-C  cetirizine (ZYRTEC) 10 MG tablet Take 10 mg by mouth daily as needed for allergies.    [provider]  clonazePAM  (KLONOPIN ) 0.5 MG tablet Take 1 tablet (0.5 mg total) by mouth at bedtime. 02/13/24   Angiulli, Toribio PARAS, PA-C  docusate sodium  (COLACE) 100 MG capsule Take 1 capsule (100 mg total) by mouth daily. 02/13/24   Angiulli, Toribio PARAS, PA-C  finasteride  (PROSCAR ) 5 MG tablet Take 1 tablet (5 mg total) by mouth daily. 02/13/24   Angiulli, Toribio PARAS, PA-C  magnesium  gluconate (MAGONATE) 500 (27 Mg) MG TABS tablet Take 0.5 tablets (250 mg total) by mouth daily. 02/13/24   Angiulli, Toribio PARAS, PA-C  scopolamine  (TRANSDERM-SCOP) 1 MG/3DAYS Place 1 patch (1 mg total) onto the skin every 3 (three) days. Patient taking differently: Place 1 patch onto the skin every 3 (three) days. Apply patch on T-Th-Sat. 02/14/24   Pegge Toribio PARAS, PA-C  vitamin D3 (CHOLECALCIFEROL )  25 MCG tablet Take 1 tablet (1,000 Units total) by mouth daily. 02/13/24   Angiulli, Toribio PARAS, PA-C    Family History History reviewed. No pertinent family history.  Social History Social History   Tobacco Use   Smoking status: Never   Smokeless tobacco: Never  Vaping Use   Vaping status: Never Used  Substance Use Topics   Alcohol use: No   Drug use: No     Allergies   Penicillins   Review of Systems Review of Systems  Genitourinary:  Positive for frequency.     Physical Exam Triage Vital Signs ED Triage Vitals  Encounter Vitals Group     BP 02/20/24 0821 96/66     Girls Systolic BP Percentile --      Girls  Diastolic BP Percentile --      Boys Systolic BP Percentile --      Boys Diastolic BP Percentile --      Pulse Rate 02/20/24 0821 82     Resp 02/20/24 0821 20     Temp 02/20/24 0821 98.3 F (36.8 C)     Temp Source 02/20/24 0821 Oral     SpO2 02/20/24 0821 94 %     Weight --      Height --      Head Circumference --      Peak Flow --      Pain Score 02/20/24 0819 0     Pain Loc --      Pain Education --      Exclude from Growth Chart --    No data found.  Updated Vital Signs BP 96/66 (BP Location: Right Arm)   Pulse 82   Temp 98.3 F (36.8 C) (Oral)   Resp 20   SpO2 94%   Visual Acuity Right Eye Distance:   Left Eye Distance:   Bilateral Distance:    Right Eye Near:   Left Eye Near:    Bilateral Near:     Physical Exam Constitutional:      Appearance: Normal appearance.  Pulmonary:     Effort: Pulmonary effort is normal.  Musculoskeletal:        General: Normal range of motion.  Neurological:     Mental Status: He is alert.  Psychiatric:        Mood and Affect: Mood normal.      UC Treatments / Results  Labs (all labs ordered are listed, but only abnormal results are displayed) Labs Reviewed  POCT URINE DIPSTICK - Abnormal; Notable for the following components:      Result Value   Blood, UA trace-intact (*)    Urobilinogen, UA 2.0 (*)    All other components within normal limits  URINE CULTURE    EKG   Radiology No results found.  Procedures Procedures (including critical care time)  Medications Ordered in UC Medications - No data to display  Initial Impression / Assessment and Plan / UC Course  I have reviewed the triage vital signs and the nursing notes.  Pertinent labs & imaging results that were available during my care of the patient were reviewed by me and considered in my medical decision making (see chart for details).     Urinary frequency-urine with trace blood but otherwise normal. Sending for culture.  His symptoms have  improved since last night.  Will treat based on culture results if needed.  Final Clinical Impressions(s) / UC Diagnoses   Final diagnoses:  Urinary frequency  Discharge Instructions      No concerns for infection on your urinalysis.  Will send for culture. Follow-up as needed     ED Prescriptions   None    PDMP not reviewed this encounter.   Adah Wilbert LABOR, FNP 02/20/24 [phone]

## 2024-02-20 NOTE — ED Triage Notes (Signed)
 Pt c/o urinary frequency that started last night. He was also having low back pain and pelvic pain that has stopped since coming into the office. Pt is also having hesitancy but is on meds to help.  Pt denies dysuria or hematuria. Pt just got out of the hospital yesterday and has been in the hospital since 02/01/24 due to a subdural hematoma. While in the hospital he did use a condom cath for some of his stay.

## 2024-02-20 NOTE — Discharge Instructions (Signed)
 No concerns for infection on your urinalysis.  Will send for culture. Follow-up as needed

## 2024-02-22 ENCOUNTER — Encounter (HOSPITAL_COMMUNITY): Payer: Self-pay

## 2024-02-22 ENCOUNTER — Other Ambulatory Visit (HOSPITAL_COMMUNITY): Payer: Self-pay | Admitting: Neurosurgery

## 2024-02-22 ENCOUNTER — Ambulatory Visit (HOSPITAL_COMMUNITY): Payer: Self-pay

## 2024-02-22 DIAGNOSIS — Q2730 Arteriovenous malformation, site unspecified: Secondary | ICD-10-CM

## 2024-02-22 HISTORY — PX: IR ANGIO INTRA EXTRACRAN SEL INTERNAL CAROTID UNI L MOD SED: IMG5361

## 2024-02-22 HISTORY — PX: IR ANGIOGRAM FOLLOW UP STUDY: IMG697

## 2024-02-22 LAB — URINE CULTURE: Culture: 40000 — AB

## 2024-02-23 DIAGNOSIS — I639 Cerebral infarction, unspecified: Secondary | ICD-10-CM

## 2024-02-23 HISTORY — DX: Cerebral infarction, unspecified: I63.9

## 2024-02-24 ENCOUNTER — Other Ambulatory Visit: Payer: Self-pay | Admitting: Neurological Surgery

## 2024-02-24 DIAGNOSIS — S065XAA Traumatic subdural hemorrhage with loss of consciousness status unknown, initial encounter: Secondary | ICD-10-CM

## 2024-02-27 NOTE — Progress Notes (Signed)
 Subjective:    Patient ID: Edwin Dickerson, male    DOB: 1950-08-20, 73 y.o.   MRN: [id number]  HPI: Edwin Dickerson is a 73 y.o. male who is here for HFU appointment for F/U of his Traumatic Subdural hematoma and Hyperlipidemia. He presented to [facility] ED on 02/01/2024 with altered mental status, word finding difficulty and right side weakness.   Orlean Suzen Lacks NP: H&P: 02/01/2024 73 year old male presented to the hospital tonight with progressive confusion and right sided weakness over the last couple days. He apparenlty hit his head about 4 weeks ago. He has had headaches ever since then but they have been manageable. His headaches have also gotten worse over the last couple days. Denies any change in his vision, NV or dizziness. Does not take any blood thinners   CT Head: WO Contrast IMPRESSION: 1. Mixed density subdural hematoma centered along the left frontal convexity (measuring up to 2.3 mm in thickness). Mass effect upon the underlying brain parenchyma with 8 mm rightward midline shift. 2. Mild cerebral atrophy.  He underwent: on 02/01/2024: Dr Joshua CRANIOTOMY HEMATOMA EVACUATION SUBDURAL   He was admitted to inpatient rehabilitation on 02/06/2024 and discharged home on 02/14/2024. He is schedulked for Outpatient Therapy with [facility].  He denies any pain. He rates his pain 0. Alspo reports he has a good appetite.   He presented to [facility] ED on 02/15/2024 via EMS for evaluation of dizziness and hypotension, discharge summary was reviewed. He was discharged on 02/19/2024.     Pain Inventory Average Pain 0 Pain Right Now 0 My pain is no pain  LOCATION OF PAIN  No pain  BOWEL Number of stools per week: 7 Oral laxative use Yes  Type of laxative Colace  BLADDER Normal    Mobility walk without assistance walk with assistance use a walker how many minutes can you walk? 10 ability to climb steps?  yes do you drive?  no Do you have any goals in  this area?  yes  Function retired I need assistance with the following:  bathing and meal prep Do you have any goals in this area?  yes  Neuro/Psych bowel control problems trouble walking dizziness  Prior Studies Any changes since last visit?  yes  Physicians involved in your care Any changes since last visit?  no   No family history on file. Social History   Socioeconomic History  . Marital status: Married    Spouse name: Not on file  . Number of children: Not on file  . Years of education: Not on file  . Highest education level: Not on file  Occupational History  . Not on file  Tobacco Use  . Smoking status: Never  . Smokeless tobacco: Never  Vaping Use  . Vaping status: Never Used  Substance and Sexual Activity  . Alcohol use: No  . Drug use: No  . Sexual activity: Not on file  Other Topics Concern  . Not on file  Social History Narrative  . Not on file   Social Drivers of Health   Financial Resource Strain: Not on file  Food Insecurity: No Food Insecurity (02/04/2024)   Hunger Vital Sign   . Worried About Programme researcher, broadcasting/film/video in the Last Year: Never true   . Ran Out of Food in the Last Year: Never true  Transportation Needs: No Transportation Needs (02/04/2024)   PRAPARE - Transportation   . Lack of Transportation (Medical): No   .  Lack of Transportation (Non-Medical): No  Physical Activity: Not on file  Stress: Not on file  Social Connections: Moderately Integrated (02/04/2024)   Social Connection and Isolation Panel   . Frequency of Communication with Friends and Family: Three times a week   . Frequency of Social Gatherings with Friends and Family: Twice a week   . Attends Religious Services: 1 to 4 times per year   . Active Member of Clubs or Organizations: No   . Attends Banker Meetings: Never   . Marital Status: Married   Past Surgical History:  Procedure Laterality Date  . CRANIOTOMY Left 02/01/2024   Procedure: CRANIOTOMY HEMATOMA  EVACUATION SUBDURAL;  Surgeon: Joshua Alm Hamilton, MD;  Location: [facility] OR;  Service: Neurosurgery;  Laterality: Left;  . IR ANGIO EXTERNAL CAROTID SEL EXT CAROTID UNI L MOD SED  02/17/2024  . IR ANGIO INTRA EXTRACRAN SEL INTERNAL CAROTID UNI L MOD SED  02/22/2024  . IR ANGIOGRAM FOLLOW UP STUDY  02/22/2024  . IR ANGIOGRAM FOLLOW UP STUDY  02/17/2024  . IR NEURO EACH ADD'L AFTER BASIC UNI LEFT (MS)  02/17/2024  . IR TRANSCATH/EMBOLIZ  02/17/2024  . PARS PLANA REPAIR OF RETINAL DEATACHMENT    . RADIOLOGY WITH ANESTHESIA N/A 02/17/2024   Procedure: RADIOLOGY WITH ANESTHESIA;  Surgeon: Lanis Pupa, MD;  Location: [facility] OR;  Service: Radiology;  Laterality: N/A;  EMBOLIZATION   Past Medical History:  Diagnosis Date  . High cholesterol   . TIA (transient ischemic attack)    There were no vitals taken for this visit.  Opioid Risk Score:   Fall Risk Score:  `1  Depression screen [facility] 2/9     12/06/2016    3:45 PM 11/01/2016    9:38 AM  Depression screen PHQ 2/9  Decreased Interest 0 0  Down, Depressed, Hopeless 0 0  PHQ - 2 Score 0 0    Review of Systems  Musculoskeletal:  Positive for gait problem.  Neurological:  Positive for dizziness.  All other systems reviewed and are negative.      Objective:   Physical Exam Vitals and nursing note reviewed.  Constitutional:      Appearance: Normal appearance.  Cardiovascular:     Rate and Rhythm: Normal rate and regular rhythm.     Pulses: Normal pulses.     Heart sounds: Normal heart sounds.  Pulmonary:     Effort: Pulmonary effort is normal.     Breath sounds: Normal breath sounds.  Musculoskeletal:     Comments: Normal Muscle Bulk and Muscle Testing Reveals:  Upper Extremities: Full ROM and Muscle Strength 5/5  Lower Extremities: Full ROM and Muscle Strength 5/5 Arises from Table slowly     Skin:    General: Skin is warm and dry.  Neurological:     Mental Status: He is alert and oriented to person, place, and time.  Psychiatric:         Mood and Affect: Mood normal.        Behavior: Behavior normal.          Assessment & Plan:  Traumatic Subdural hematoma: S/ P: Dr Joshua: on 02/01/2024: CRANIOTOMY HEMATOMA EVACUATION SUBDURAL > Has a scheduled appointment with Outpatient Therapy at [facility]. Dr Joshua Following  Hyperlipidemia. Continue current medication regimen. PCP following. Continue to Monitor.   F/U with Dr Lorilee in 4- 6 weeks

## 2024-02-28 ENCOUNTER — Encounter: Payer: Self-pay | Admitting: Registered Nurse

## 2024-02-28 ENCOUNTER — Encounter: Attending: Registered Nurse | Admitting: Registered Nurse

## 2024-02-28 VITALS — BP 101/68 | HR 73 | Wt 189.0 lb

## 2024-02-28 DIAGNOSIS — S065XAA Traumatic subdural hemorrhage with loss of consciousness status unknown, initial encounter: Secondary | ICD-10-CM | POA: Insufficient documentation

## 2024-02-28 DIAGNOSIS — E7849 Other hyperlipidemia: Secondary | ICD-10-CM | POA: Diagnosis present

## 2024-03-01 ENCOUNTER — Inpatient Hospital Stay
Admission: RE | Admit: 2024-03-01 | Discharge: 2024-03-01 | Attending: Neurological Surgery | Admitting: Neurological Surgery

## 2024-03-01 DIAGNOSIS — S065XAA Traumatic subdural hemorrhage with loss of consciousness status unknown, initial encounter: Secondary | ICD-10-CM

## 2024-03-14 ENCOUNTER — Other Ambulatory Visit: Payer: Self-pay | Admitting: Neurological Surgery

## 2024-03-14 DIAGNOSIS — S065XAA Traumatic subdural hemorrhage with loss of consciousness status unknown, initial encounter: Secondary | ICD-10-CM

## 2024-03-23 ENCOUNTER — Other Ambulatory Visit: Payer: Self-pay

## 2024-03-23 ENCOUNTER — Emergency Department (HOSPITAL_COMMUNITY)

## 2024-03-23 ENCOUNTER — Encounter (HOSPITAL_COMMUNITY): Payer: Self-pay | Admitting: Emergency Medicine

## 2024-03-23 ENCOUNTER — Inpatient Hospital Stay (HOSPITAL_COMMUNITY)
Admission: EM | Admit: 2024-03-23 | Discharge: 2024-03-28 | DRG: 100 | Disposition: A | Discharge status: Home / Self Care | Attending: Internal Medicine | Admitting: Internal Medicine

## 2024-03-23 DIAGNOSIS — H538 Other visual disturbances: Secondary | ICD-10-CM | POA: Diagnosis present

## 2024-03-23 DIAGNOSIS — S065XAA Traumatic subdural hemorrhage with loss of consciousness status unknown, initial encounter: Secondary | ICD-10-CM | POA: Diagnosis not present

## 2024-03-23 DIAGNOSIS — E7800 Pure hypercholesterolemia, unspecified: Secondary | ICD-10-CM | POA: Diagnosis present

## 2024-03-23 DIAGNOSIS — I10 Essential (primary) hypertension: Secondary | ICD-10-CM | POA: Diagnosis not present

## 2024-03-23 DIAGNOSIS — N400 Enlarged prostate without lower urinary tract symptoms: Secondary | ICD-10-CM | POA: Diagnosis present

## 2024-03-23 DIAGNOSIS — R4701 Aphasia: Secondary | ICD-10-CM

## 2024-03-23 DIAGNOSIS — E785 Hyperlipidemia, unspecified: Secondary | ICD-10-CM | POA: Diagnosis present

## 2024-03-23 DIAGNOSIS — I951 Orthostatic hypotension: Secondary | ICD-10-CM | POA: Diagnosis present

## 2024-03-23 DIAGNOSIS — Z9889 Other specified postprocedural states: Secondary | ICD-10-CM

## 2024-03-23 DIAGNOSIS — E669 Obesity, unspecified: Secondary | ICD-10-CM | POA: Diagnosis present

## 2024-03-23 DIAGNOSIS — I69022 Dysarthria following nontraumatic subarachnoid hemorrhage: Secondary | ICD-10-CM

## 2024-03-23 DIAGNOSIS — I69051 Hemiplegia and hemiparesis following nontraumatic subarachnoid hemorrhage affecting right dominant side: Secondary | ICD-10-CM

## 2024-03-23 DIAGNOSIS — I69020 Aphasia following nontraumatic subarachnoid hemorrhage: Secondary | ICD-10-CM

## 2024-03-23 DIAGNOSIS — I6203 Nontraumatic chronic subdural hemorrhage: Secondary | ICD-10-CM | POA: Diagnosis present

## 2024-03-23 DIAGNOSIS — R569 Unspecified convulsions: Secondary | ICD-10-CM | POA: Diagnosis not present

## 2024-03-23 DIAGNOSIS — R509 Fever, unspecified: Secondary | ICD-10-CM | POA: Diagnosis present

## 2024-03-23 DIAGNOSIS — I69098 Other sequelae following nontraumatic subarachnoid hemorrhage: Secondary | ICD-10-CM

## 2024-03-23 DIAGNOSIS — R471 Dysarthria and anarthria: Secondary | ICD-10-CM | POA: Diagnosis present

## 2024-03-23 DIAGNOSIS — I69028 Other speech and language deficits following nontraumatic subarachnoid hemorrhage: Secondary | ICD-10-CM

## 2024-03-23 DIAGNOSIS — R4182 Altered mental status, unspecified: Secondary | ICD-10-CM | POA: Diagnosis present

## 2024-03-23 DIAGNOSIS — R4781 Slurred speech: Secondary | ICD-10-CM | POA: Diagnosis present

## 2024-03-23 DIAGNOSIS — Z79899 Other long term (current) drug therapy: Secondary | ICD-10-CM

## 2024-03-23 DIAGNOSIS — I609 Nontraumatic subarachnoid hemorrhage, unspecified: Secondary | ICD-10-CM

## 2024-03-23 DIAGNOSIS — Z880 Allergy status to penicillin: Secondary | ICD-10-CM

## 2024-03-23 HISTORY — DX: Essential (primary) hypertension: I10

## 2024-03-23 HISTORY — DX: Traumatic subdural hemorrhage with loss of consciousness status unknown, initial encounter: S06.5XAA

## 2024-03-23 HISTORY — DX: Unspecified convulsions: R56.9

## 2024-03-23 LAB — COMPREHENSIVE METABOLIC PANEL WITH GFR
ALT: 21 U/L (ref 0–44)
AST: 21 U/L (ref 15–41)
Albumin: 3.6 g/dL (ref 3.5–5.0)
Alkaline Phosphatase: 73 U/L (ref 38–126)
Anion gap: 10 (ref 5–15)
BUN: 11 mg/dL (ref 8–23)
CO2: 23 mmol/L (ref 22–32)
Calcium: 8.9 mg/dL (ref 8.9–10.3)
Chloride: 107 mmol/L (ref 98–111)
Creatinine, Ser: 0.97 mg/dL (ref 0.61–1.24)
GFR, Estimated: >60 mL/min (ref >60)
Glucose, Bld: 94 mg/dL (ref 70–99)
Potassium: 4.6 mmol/L (ref 3.5–5.1)
Sodium: 140 mmol/L (ref 135–145)
Total Bilirubin: 1.6 mg/dL — ABNORMAL HIGH (ref 0.0–1.2)
Total Protein: 6.4 g/dL — ABNORMAL LOW (ref 6.5–8.1)

## 2024-03-23 LAB — I-STAT CHEM 8, ED
BUN: 13 mg/dL (ref 8–23)
Calcium, Ion: 1.12 mmol/L — ABNORMAL LOW (ref 1.15–1.40)
Chloride: 104 mmol/L (ref 98–111)
Creatinine, Ser: 1.1 mg/dL (ref 0.61–1.24)
Glucose, Bld: 94 mg/dL (ref 70–99)
HCT: 37 % — ABNORMAL LOW (ref 39.0–52.0)
Hemoglobin: 12.6 g/dL — ABNORMAL LOW (ref 13.0–17.0)
Potassium: 4.6 mmol/L (ref 3.5–5.1)
Sodium: 140 mmol/L (ref 135–145)
TCO2: 24 mmol/L (ref 22–32)

## 2024-03-23 LAB — DIFFERENTIAL
Abs Immature Granulocytes: 0.06 K/uL (ref 0.00–0.07)
Basophils Absolute: 0.1 K/uL (ref 0.0–0.1)
Basophils Relative: 1 %
Eosinophils Absolute: 0.1 K/uL (ref 0.0–0.5)
Eosinophils Relative: 2 %
Immature Granulocytes: 1 %
Lymphocytes Relative: 18 %
Lymphs Abs: 1.2 K/uL (ref 0.7–4.0)
Monocytes Absolute: 0.5 K/uL (ref 0.1–1.0)
Monocytes Relative: 7 %
Neutro Abs: 4.9 K/uL (ref 1.7–7.7)
Neutrophils Relative %: 71 %

## 2024-03-23 LAB — CBC
HCT: 38.5 % — ABNORMAL LOW (ref 39.0–52.0)
Hemoglobin: 12.9 g/dL — ABNORMAL LOW (ref 13.0–17.0)
MCH: 30.5 pg (ref 26.0–34.0)
MCHC: 33.5 g/dL (ref 30.0–36.0)
MCV: 91 fL (ref 80.0–100.0)
Platelets: 198 K/uL (ref 150–400)
RBC: 4.23 MIL/uL (ref 4.22–5.81)
RDW: 13.2 % (ref 11.5–15.5)
WBC: 6.8 K/uL (ref 4.0–10.5)
nRBC: 0 % (ref 0.0–0.2)

## 2024-03-23 LAB — PROTIME-INR
INR: 1 (ref 0.8–1.2)
Prothrombin Time: 13.4 s (ref 11.4–15.2)

## 2024-03-23 LAB — RAPID URINE DRUG SCREEN, HOSP PERFORMED
Amphetamines: NOT DETECTED
Barbiturates: NOT DETECTED
Benzodiazepines: NOT DETECTED
Cocaine: NOT DETECTED
Opiates: NOT DETECTED
Tetrahydrocannabinol: NOT DETECTED

## 2024-03-23 LAB — APTT: aPTT: 27 s (ref 24–36)

## 2024-03-23 LAB — ETHANOL: Alcohol, Ethyl (B): <15 mg/dL (ref <15)

## 2024-03-23 MED ORDER — ONDANSETRON HCL 4 MG PO TABS
4.0000 mg | ORAL_TABLET | Freq: Four times a day (QID) | ORAL | Status: DC | PRN
  Administered 2024-03-26: 4 mg via ORAL
  Filled 2024-03-23: qty 1

## 2024-03-23 MED ORDER — ACETAMINOPHEN 650 MG RE SUPP: 650.0000 mg | Freq: Four times a day (QID) | RECTAL | Status: DC | PRN

## 2024-03-23 MED ORDER — ATORVASTATIN CALCIUM 40 MG PO TABS
40.0000 mg | ORAL_TABLET | Freq: Every day | ORAL | Status: DC
  Administered 2024-03-24 – 2024-03-27 (×4): 40 mg via ORAL
  Filled 2024-03-23 (×4): qty 1

## 2024-03-23 MED ORDER — LORAZEPAM 0.5 MG PO TABS
0.5000 mg | ORAL_TABLET | Freq: Once | ORAL | Status: AC | PRN
Start: 2024-03-23 — End: 2024-03-26
  Administered 2024-03-26: 0.5 mg via ORAL
  Filled 2024-03-23: qty 1

## 2024-03-23 MED ORDER — ACETAMINOPHEN 325 MG PO TABS: 650.0000 mg | ORAL_TABLET | Freq: Four times a day (QID) | ORAL | Status: DC | PRN

## 2024-03-23 MED ORDER — LEVETIRACETAM 500 MG PO TABS
500.0000 mg | ORAL_TABLET | Freq: Two times a day (BID) | ORAL | Status: DC
  Administered 2024-03-23: 500 mg via ORAL
  Filled 2024-03-23: qty 1

## 2024-03-23 MED ORDER — LORAZEPAM 2 MG/ML IJ SOLN
1.0000 mg | Freq: Once | INTRAMUSCULAR | Status: AC | PRN
Start: 2024-03-23 — End: 2024-03-23
  Administered 2024-03-23: 1 mg via INTRAVENOUS
  Filled 2024-03-23: qty 1

## 2024-03-23 MED ORDER — FINASTERIDE 5 MG PO TABS
5.0000 mg | ORAL_TABLET | Freq: Every day | ORAL | Status: DC
  Administered 2024-03-24 – 2024-03-28 (×5): 5 mg via ORAL
  Filled 2024-03-23 (×5): qty 1

## 2024-03-23 MED ORDER — ONDANSETRON HCL 4 MG/2ML IJ SOLN: 4.0000 mg | Freq: Four times a day (QID) | INTRAMUSCULAR | Status: DC | PRN

## 2024-03-23 NOTE — Hospital Course (Addendum)
 Edwin Dickerson is a 73 y.o. 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  Patient presented secondary to worsening confusion and slurred speech, found to have a frontal and parietotemporal subarachnoid hemorrhage. Neurosurgery consulted. Concern for possible seizure, with Keppra  recommended by neurology.

## 2024-03-23 NOTE — ED Notes (Signed)
 Patient transported to MRI

## 2024-03-23 NOTE — ED Triage Notes (Signed)
 Patient's wife states altered mental status and slurred speech for two days. LKW 10/22. States symptoms even worse today. SDH in September and she states these are same symptoms as his presentation then.

## 2024-03-23 NOTE — Assessment & Plan Note (Addendum)
 S/p craniotomy for traumatic SDH on 02/01/24 by Dr. Joshua S/p middle meningeal artery embolization 02/17/24 by Dr. Lanis This appears radiographically stable

## 2024-03-23 NOTE — Assessment & Plan Note (Signed)
 Slurred speech Presents with slurred speech, some aphasia, no focal weakness, no LOC or observed seizures.  Oriented x4 but responses slowed.  CTH normal but MRI brain showed frontal and parietotemporal SAHs.   - Consult NSGY - Repeat MRI brain tomorrow - PT eval - EEG - Hold further Keppra  for now, until EEG done and Neurosurgery have seen

## 2024-03-23 NOTE — ED Provider Notes (Signed)
 [place] EMERGENCY DEPARTMENT AT [facility] Provider Note   CSN: [id number] Arrival date & time: 03/23/24  1407     Patient presents with: Altered Mental Status   Edwin Dickerson is a 73 y.o. male.   Pt is a 73 yo male with pmhx significant for HLD, TIA and hx SDH.  Pt denies any trauma, but feels the same way he did when he was admitted with a SDH.  Dr. Joshua (NS) did a craniotomy on patient in Sept. Dr. Lanis did a medial medial artery embolization later that month.  Pt said he feels drunk.  His wife noticed some slurred speech about 2 days ago.  Today, she noticed his right hand drawing up.  Pt is able to ambulate, but does fee off balance and dizzy.         Prior to Admission medications   Medication Sig Start Date End Date Taking? Authorizing Provider  acetaminophen  (TYLENOL ) 325 MG tablet Take 2 tablets (650 mg total) by mouth every 4 (four) hours as needed for mild pain (pain score 1-3) (temp > 100.5). 02/07/24   Angiulli, Toribio PARAS, PA-C  atorvastatin  (LIPITOR) 40 MG tablet Take 1 tablet (40 mg total) by mouth daily at 6 PM. 02/13/24   Angiulli, Toribio PARAS, PA-C  B Complex-C (B-COMPLEX WITH VITAMIN C) tablet Take 1 tablet by mouth daily. 02/13/24   Angiulli, Toribio PARAS, PA-C  cetirizine (ZYRTEC) 10 MG tablet Take 10 mg by mouth daily as needed for allergies.    [provider]  clonazePAM  (KLONOPIN ) 0.5 MG tablet Take 1 tablet (0.5 mg total) by mouth at bedtime. 02/13/24   Angiulli, Toribio PARAS, PA-C  docusate sodium  (COLACE) 100 MG capsule Take 1 capsule (100 mg total) by mouth daily. 02/13/24   Angiulli, Toribio PARAS, PA-C  finasteride  (PROSCAR ) 5 MG tablet Take 1 tablet (5 mg total) by mouth daily. 02/13/24   Angiulli, Toribio PARAS, PA-C  magnesium  gluconate (MAGONATE) 500 (27 Mg) MG TABS tablet Take 0.5 tablets (250 mg total) by mouth daily. 02/13/24   Angiulli, Toribio PARAS, PA-C  scopolamine  (TRANSDERM-SCOP) 1 MG/3DAYS Place 1 patch (1 mg total) onto the skin every 3  (three) days. 02/14/24   Angiulli, Toribio PARAS, PA-C  vitamin D3 (CHOLECALCIFEROL ) 25 MCG tablet Take 1 tablet (1,000 Units total) by mouth daily. 02/13/24   Angiulli, Toribio PARAS, PA-C    Allergies: Penicillins    Review of Systems  Neurological:  Positive for dizziness, speech difficulty and weakness.  All other systems reviewed and are negative.   Updated Vital Signs BP 137/80   Pulse (!) 55   Temp 98 F (36.7 C) (Oral)   Resp 16   SpO2 97%   Physical Exam Vitals and nursing note reviewed.  Constitutional:      Appearance: Normal appearance.  HENT:     Head: Normocephalic and atraumatic.     Right Ear: External ear normal.     Left Ear: External ear normal.     Nose: Nose normal.     Mouth/Throat:     Mouth: Mucous membranes are moist.     Pharynx: Oropharynx is clear.  Eyes:     Extraocular Movements: Extraocular movements intact.     Conjunctiva/sclera: Conjunctivae normal.     Pupils: Pupils are equal, round, and reactive to light.  Cardiovascular:     Rate and Rhythm: Normal rate and regular rhythm.     Pulses: Normal pulses.     Heart sounds: Normal heart  sounds.  Pulmonary:     Effort: Pulmonary effort is normal.     Breath sounds: Normal breath sounds.  Abdominal:     General: Abdomen is flat. Bowel sounds are normal.     Palpations: Abdomen is soft.  Musculoskeletal:        General: Normal range of motion.     Cervical back: Normal range of motion and neck supple.  Skin:    General: Skin is warm.     Capillary Refill: Capillary refill takes less than 2 seconds.  Neurological:     General: No focal deficit present.     Mental Status: He is alert and oriented to person, place, and time.  Psychiatric:        Mood and Affect: Mood normal.        Behavior: Behavior normal.     (all labs ordered are listed, but only abnormal results are displayed) Labs Reviewed  CBC - Abnormal; Notable for the following components:      Result Value   Hemoglobin 12.9 (*)     HCT 38.5 (*)    All other components within normal limits  COMPREHENSIVE METABOLIC PANEL WITH GFR - Abnormal; Notable for the following components:   Total Protein 6.4 (*)    Total Bilirubin 1.6 (*)    All other components within normal limits  I-STAT CHEM 8, ED - Abnormal; Notable for the following components:   Calcium , Ion 1.12 (*)    Hemoglobin 12.6 (*)    HCT 37.0 (*)    All other components within normal limits  PROTIME-INR  APTT  DIFFERENTIAL  ETHANOL  RAPID URINE DRUG SCREEN, HOSP PERFORMED  CBG MONITORING, ED    EKG: EKG Interpretation Date/Time:  Friday March 23 2024 15:14:25 EDT Ventricular Rate:  54 PR Interval:  171 QRS Duration:  125 QT Interval:  477 QTC Calculation: 453 R Axis:   -40  Text Interpretation: Sinus rhythm Nonspecific IVCD with LAD Left ventricular hypertrophy Since last tracing rate slower Confirmed by Dean Clarity [phone]) on 03/23/2024 3:16:19 PM  Radiology: MR BRAIN WO CONTRAST Result Date: 03/23/2024 EXAM: MRI BRAIN WITHOUT CONTRAST 03/23/2024 06:47:11 PM TECHNIQUE: Multiplanar multisequence MRI of the head/brain was performed without the administration of intravenous contrast. COMPARISON: Same day CT Head CLINICAL HISTORY: Neuro deficit, acute, stroke suspected. FINDINGS: BRAIN AND VENTRICLES: No acute infarct. Approximately 12 mm thick left parietal convexity extra-axial fluid collection appears similar when comparing across modalities. Similar trace rightward midline shift. Small to moderate volume of surrounding frontal, parietal, and temporal convexity FLAIR hyperintensity and susceptibility artifact which is compatible with subarachnoid hemorrhage. No mass. No hydrocephalus. Normal flow voids. ORBITS: No acute abnormality. SINUSES AND MASTOIDS: No acute abnormality. BONES AND SOFT TISSUES: Normal marrow signal. No acute soft tissue abnormality. IMPRESSION: 1. Approximately 12 mm thick left parietal convexity extra-axial fluid collection  appears similar when comparing across modalities. Similar trace rightward midline shift. 2. Small to moderate volume of surrounding frontal, parietal, and temporal convexity subarachnoid hemorrhage. This finding may be more conspicuous due to improved visualization by MRI (versus CT), but recommend a short interval follow-up noncontrast head CT to exclude progressive or new subarachnoid hemorrhage. Electronically signed by: Gilmore Molt MD 03/23/2024 08:11 PM EDT RP Workstation: [id number]   CT HEAD WO CONTRAST Result Date: 03/23/2024 EXAM: CT HEAD WITHOUT CONTRAST 03/23/2024 03:23:00 PM TECHNIQUE: CT of the head was performed without the administration of intravenous contrast. Automated exposure control, iterative reconstruction, and/or weight based adjustment of the mA/kV  was utilized to reduce the radiation dose to as low as reasonably achievable. COMPARISON: None available. CLINICAL HISTORY: Neuro deficit, acute, stroke suspected. Altered Mental Status; CT HEAD WO CONTRAST; Neuro deficit, acute, stroke suspected; See ED Notes:; Patient's wife states altered mental status and slurred speech for two days. LKW 10/22. States symptoms even worse today. SDH in September and she states these are same symptoms as his presentation then. FINDINGS: BRAIN AND VENTRICLES: A more chronic-appearing subdural collection over the left parietal lobe has decreased in size, now measuring 12 mm maximally on coronal images compared with 19 mm previously. A more hyperdense collection below the calvarium, possibly representing diffuse dural thickening or a component of the subdural collection, has slightly increased since the prior study. Residual mass effect is present with effacement of the sulci over the left convexity. 2 mm midline shift has improved. Minimal extradural fluid below the craniotomy site is stable. No acute hemorrhage. No evidence of acute infarct. No hydrocephalus. ORBITS: Bilateral lens replacements are noted.  The globes and orbits are otherwise within normal limits. SINUSES: No acute abnormality. SOFT TISSUES AND SKULL: No acute soft tissue abnormality. No skull fracture. IMPRESSION: 1. No acute intracranial abnormality. 2. 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 Electronically signed by: Lonni Necessary MD 03/23/2024 03:32 PM EDT RP Workstation: [id number]     Procedures   Medications Ordered in the ED  levETIRAcetam  (KEPPRA ) tablet 500 mg (has no administration in time range)  LORazepam (ATIVAN) injection 1 mg (1 mg Intravenous Given 03/23/24 1746)                                    Medical Decision Making Amount and/or Complexity of Data Reviewed Labs: ordered. Radiology: ordered.  Risk Prescription drug management. Decision regarding hospitalization.   This patient presents to the ED for concern of slurred speech and weakness, this involves an extensive number of treatment options, and is a complaint that carries with it a high risk of complications and morbidity.  The differential diagnosis includes cva, sdh, tia   Co morbidities that complicate the patient evaluation  HLD, TIA and hx SDH   Additional history obtained:  Additional history obtained from epic chart review External records from outside source obtained and reviewed including wife   Lab Tests:  I Ordered, and personally interpreted labs.  The pertinent results include:  cbc nl, cmp nl, etoh neg   Imaging Studies ordered:  I ordered imaging studies including ct head/mri brain  I independently visualized and interpreted imaging which showed  CT head: No acute intracranial abnormality.  2. Chronic left parietal subdural collection decreased to 12 mm with improved 2  mm midline shift and residual sulcal effacement.  MRI: 1. Approximately 12 mm thick left parietal convexity extra-axial fluid  collection appears similar  when comparing across modalities. Similar trace  rightward midline shift.  2. Small to moderate volume of surrounding frontal, parietal, and temporal  convexity subarachnoid hemorrhage. This finding may be more conspicuous due to  improved visualization by MRI (versus CT), but recommend a short interval  follow-up noncontrast head CT to exclude progressive or new subarachnoid  hemorrhage.   I agree with the radiologist interpretation   Cardiac Monitoring:  The patient was maintained on a cardiac monitor.  I personally viewed and interpreted the cardiac monitored which showed an underlying rhythm  of: ns   Medicines ordered and prescription drug management:  I ordered medication including keppra   for sx  Reevaluation of the patient after these medicines showed that the patient improved I have reviewed the patients home medicines and have made adjustments as needed   Test Considered:  Ct/mri  Consultations Obtained:  I requested consultation with the neurology (Dr. Michaela),  and discussed lab and imaging findings as well as pertinent plan - he said maybe seizure, so keppra  500 bid Pt d/w NS [facility] for [facility]) who recommended obs overnight and rescan in the am.  NS will see in consult Pt d/w Dr. Jonel ([facility]) for admission.   Problem List / ED Course:  SDH/SAH:  neuro status has improved, but he still feels dizzy.  Will admit for obs tonight and re-scan in am.  Keppra  started.   Reevaluation:  After the interventions noted above, I reevaluated the patient and found that they have :improved   Social Determinants of Health:  Lives at home   Dispostion:  After consideration of the diagnostic results and the patients response to treatment, I feel that the patent would benefit from admission.       Final diagnoses:  SDH (subdural hematoma) (HCC)  SAH (subarachnoid hemorrhage) (HCC)  Seizure-like activity [facility])    ED Discharge Orders     None           Dean Clarity, MD 03/23/24 2111

## 2024-03-23 NOTE — ED Triage Notes (Incomplete)
 Family states they performed procedure to repair SDH in September.  No falls since. A&O x3 at this time. DO to time.

## 2024-03-23 NOTE — Assessment & Plan Note (Signed)
 Continue losartan 

## 2024-03-23 NOTE — H&P (Signed)
 History and Physical    Patient: Edwin Dickerson FMW:[id number] DOB: 26-May-1951 DOA: 03/23/2024 DOS: the patient was seen and examined on 03/23/2024 PCP: Marelyn Axon, MD  Patient coming from: Home  Chief Complaint:  Chief Complaint  Patient presents with   Altered Mental Status       HPI:  73 y.o. M with obesity, HLD, chronic orthostasis, and SDH 8 weeks ago s/p craniotomy on 02/01/24 by Dr. Joshua and middle meningeal artery embo on 9/19 who presented with worsening confusion and slurred speech for 2 days.  Caveat that all history collected from patient who is somewhat confused.  Wife left ER prior to admission order, was not reachable by phone.    Per EDP, two days ago, something seemed wrong with patient, but very mild.  Then today, patient seemed very slurred, wife couldn't understand him at all so she brought him to the ER.  She also noticed his hand making an odd clenching movement at times, so she came to the ER.  In the ER, UDS negative, WBC normal. Electrolytes and renal function normal.  ECG unremrakable. VS WNL.  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  MRI showed new surrounding frontal, parietotemporal SAH.    Neurosurgery rtecommended overnight observation and repeat MRI in the morning.  Neurology recommended Keppra .  Patient reports blurry vision a few days ago (has had this since his retinal surgery, seemed worse the other day) now better.  He also could tell he couldn't get his words out, but had no other focal symptoms, doesn't think he's passed out.      ROS   Past Medical History:  Diagnosis Date   High cholesterol    Hypertension    SDH (subdural hematoma) (HCC)    TIA (transient ischemic attack)    Past Surgical History:  Procedure Laterality Date   CRANIOTOMY Left 02/01/2024   Procedure: CRANIOTOMY HEMATOMA EVACUATION SUBDURAL;  Surgeon: Joshua Alm Hamilton, MD;  Location: [facility] OR;  Service: Neurosurgery;  Laterality: Left;   IR ANGIO  EXTERNAL CAROTID SEL EXT CAROTID UNI L MOD SED  02/17/2024   IR ANGIO INTRA EXTRACRAN SEL INTERNAL CAROTID UNI L MOD SED  02/22/2024   IR ANGIOGRAM FOLLOW UP STUDY  02/22/2024   IR ANGIOGRAM FOLLOW UP STUDY  02/17/2024   IR NEURO EACH ADD'L AFTER BASIC UNI LEFT (MS)  02/17/2024   IR TRANSCATH/EMBOLIZ  02/17/2024   PARS PLANA REPAIR OF RETINAL DEATACHMENT     RADIOLOGY WITH ANESTHESIA N/A 02/17/2024   Procedure: RADIOLOGY WITH ANESTHESIA;  Surgeon: Lanis Pupa, MD;  Location: [facility] OR;  Service: Radiology;  Laterality: N/A;  EMBOLIZATION   Social History:  reports that he has never smoked. He has never used smokeless tobacco. He reports that he does not drink alcohol and does not use drugs.  Allergies  Allergen Reactions   Penicillins Hives, Rash and Other (See Comments)    Syncope     History reviewed. No pertinent family history.  Prior to Admission medications   Medication Sig Start Date End Date Taking? Authorizing Provider  acetaminophen  (TYLENOL ) 325 MG tablet Take 2 tablets (650 mg total) by mouth every 4 (four) hours as needed for mild pain (pain score 1-3) (temp > 100.5). 02/07/24   Angiulli, Toribio PARAS, PA-C  atorvastatin  (LIPITOR) 40 MG tablet Take 1 tablet (40 mg total) by mouth daily at 6 PM. 02/13/24   Angiulli, Toribio PARAS, PA-C  B Complex-C (B-COMPLEX WITH VITAMIN C) tablet Take 1 tablet  by mouth daily. 02/13/24   Angiulli, Toribio PARAS, PA-C  cetirizine (ZYRTEC) 10 MG tablet Take 10 mg by mouth daily as needed for allergies.    [provider]  clonazePAM  (KLONOPIN ) 0.5 MG tablet Take 1 tablet (0.5 mg total) by mouth at bedtime. 02/13/24   Angiulli, Toribio PARAS, PA-C  docusate sodium  (COLACE) 100 MG capsule Take 1 capsule (100 mg total) by mouth daily. 02/13/24   Angiulli, Toribio PARAS, PA-C  finasteride  (PROSCAR ) 5 MG tablet Take 1 tablet (5 mg total) by mouth daily. 02/13/24   Angiulli, Toribio PARAS, PA-C  magnesium  gluconate (MAGONATE) 500 (27 Mg) MG TABS tablet Take 0.5 tablets (250 mg  total) by mouth daily. 02/13/24   Angiulli, Toribio PARAS, PA-C  scopolamine  (TRANSDERM-SCOP) 1 MG/3DAYS Place 1 patch (1 mg total) onto the skin every 3 (three) days. 02/14/24   Angiulli, Toribio PARAS, PA-C  vitamin D3 (CHOLECALCIFEROL ) 25 MCG tablet Take 1 tablet (1,000 Units total) by mouth daily. 02/13/24   Pegge Toribio PARAS, PA-C    Physical Exam: Vitals:   03/23/24 1411 03/23/24 1700 03/23/24 1945 03/23/24 2115  BP: 135/81 (!) 163/76 137/80 118/83  Pulse: (!) 58 (!) 50 (!) 55 66  Resp: 18 14 16 17   Temp: 97.8 F (36.6 C)  98 F (36.7 C)   TempSrc:   Oral   SpO2: 97% 100% 97% 99%   Adult male, lying in bed, interactive and appropriate, grooming and hygiene appear appropriate Anicteric, conjunctiva pink, lids and lashes normal.  No nasal deformity, discharge, or epistaxis. Dentition in good repair, lips normal, oropharynx moist, no oral lesions RRR, no murmurs, no peripheral edema Respiratory rate normal, lungs clear without rales or wheezes Abdomen soft without tenderness palpation or guarding Pupils are 3 mm and reactive to 2 mm.  Extraocular movements are intact, without nystagmus.  Cranial nerve 5 is within normal limits.  Cranial nerve 7 is symmetrical.  Cranial nerve 8 is within normal limits.  Cranial nerves 9 and 10 reveal equal palate elevation.  Cranial nerve 11 reveals sternocleidomastoid strong.  Cranial nerve 12 is midline.  Motor strength testing is 5/5 in the upper and lower extremities bilaterally with normal motor, tone and bulk.  Sensory examination is intact to light touch.  The patient is oriented to time, place and person.  Speech is disfluent, naming seems impaired and slowed, and he frequently has word finding difficulties, and hesitation with speech.  Attention span and concentration are within normal limits.    Data Reviewed: Basic metabolic panel shows normal electrolytes, renal function, and LFTs CBC shows no leukocytosis or thrombocytopenia Hemoglobin 12.9, close to  baseline Urine drug screen negative Alcohol negative CT head shows old subdural hematoma MRI brain shows no bilateral subarachnoid hemorrhage EKG, personally reviewed, shows sinus bradycardia, but no ST changes    Assessment and Plan: * Subarachnoid hemorrhage (HCC) Slurred speech Presents with slurred speech, some aphasia, no focal weakness, no LOC or observed seizures.  Oriented x4 but responses slowed.  CTH normal but MRI brain showed frontal and parietotemporal SAHs.  I presume this is the cause of his generalized altered mentation, and slurred speech.  There is a question of if he had some focal seizure activity, so Keppra  was ordered by EDP. - Consult NSGY - Repeat MRI brain tomorrow - PT eval - EEG - Hold further Keppra  for now, until EEG done and Neurosurgery have seen    Hyperlipidemia - Continue Lipitor  Hypertension - Continue losartan   SDH (subdural  hematoma) (HCC) S/p craniotomy for traumatic SDH on 02/01/24 by Dr. Joshua S/p middle meningeal artery embolization 02/17/24 by Dr. Lanis This appears radiographically stable         Advance Care Planning: FULL code  Consults: Neurosurgery, reportedly will see, I did not speak with them  Family Communication: Called to wife twice, no answer  Severity of Illness: The appropriate patient status for this patient is OBSERVATION. Observation status is judged to be reasonable and necessary in order to provide the required intensity of service to ensure the patient's safety. The patient's presenting symptoms, physical exam findings, and initial radiographic and laboratory data in the context of their medical condition is felt to place them at decreased risk for further clinical deterioration. Furthermore, it is anticipated that the patient will be medically stable for discharge from the hospital within 2 midnights of admission.   Author: Lonni SHAUNNA Dalton, MD 03/23/2024 9:31 PM  For on call review  [email or web address].

## 2024-03-23 NOTE — Assessment & Plan Note (Signed)
 -  Continue Lipitor

## 2024-03-24 ENCOUNTER — Observation Stay (HOSPITAL_COMMUNITY)

## 2024-03-24 DIAGNOSIS — I609 Nontraumatic subarachnoid hemorrhage, unspecified: Secondary | ICD-10-CM | POA: Diagnosis not present

## 2024-03-24 DIAGNOSIS — I10 Essential (primary) hypertension: Secondary | ICD-10-CM | POA: Diagnosis not present

## 2024-03-24 DIAGNOSIS — R4182 Altered mental status, unspecified: Secondary | ICD-10-CM | POA: Diagnosis not present

## 2024-03-24 DIAGNOSIS — R569 Unspecified convulsions: Secondary | ICD-10-CM

## 2024-03-24 LAB — CBC
HCT: 34.9 % — ABNORMAL LOW (ref 39.0–52.0)
Hemoglobin: 12 g/dL — ABNORMAL LOW (ref 13.0–17.0)
MCH: 30.6 pg (ref 26.0–34.0)
MCHC: 34.4 g/dL (ref 30.0–36.0)
MCV: 89 fL (ref 80.0–100.0)
Platelets: 190 K/uL (ref 150–400)
RBC: 3.92 MIL/uL — ABNORMAL LOW (ref 4.22–5.81)
RDW: 13.1 % (ref 11.5–15.5)
WBC: 6.3 K/uL (ref 4.0–10.5)
nRBC: 0 % (ref 0.0–0.2)

## 2024-03-24 LAB — BASIC METABOLIC PANEL WITH GFR
Anion gap: 9 (ref 5–15)
BUN: 12 mg/dL (ref 8–23)
CO2: 24 mmol/L (ref 22–32)
Calcium: 8.5 mg/dL — ABNORMAL LOW (ref 8.9–10.3)
Chloride: 104 mmol/L (ref 98–111)
Creatinine, Ser: 0.99 mg/dL (ref 0.61–1.24)
GFR, Estimated: >60 mL/min (ref >60)
Glucose, Bld: 91 mg/dL (ref 70–99)
Potassium: 3.7 mmol/L (ref 3.5–5.1)
Sodium: 137 mmol/L (ref 135–145)

## 2024-03-24 NOTE — Evaluation (Signed)
 Physical Therapy Brief Evaluation and Discharge Note Patient Details Name: Edwin Dickerson MRN: [id number] DOB: 10-26-1950 Today's Date: 03/24/2024   History of Present Illness  Patient is a 73 y.o. male presented to [facility] ED w/ c/o speech difficulty x2 days w/ some potential abnormal hand movement. He has a h/o L crani for evacuation of SDH on 02/01/24, s/p L MMA embolization on 02/17/24.  Clinical Impression  Pt presents with admitting diagnosis above. Pt today was able to ambulate in hallway independently with no AD and navigate stairs. PTA pt was fully independent and working with OPPT on balance. Recommend pt continue with OPPT. Pt presents at or near baseline mobility. Pt has no further acute PT needs and will be signing off. Re consult PT if mobility status changes. Pt would benefit from continued mobility with mobility specialist during acute stay.        PT Assessment All further PT needs can be met in the next venue of care  Assistance Needed at Discharge  PRN    Equipment Recommendations None recommended by PT  Recommendations for Other Services       Precautions/Restrictions Precautions Precautions: Fall Recall of Precautions/Restrictions: Intact Restrictions Weight Bearing Restrictions Per Provider Order: No        Mobility  Bed Mobility     Sit to supine/sidelying: Independent General bed mobility comments: Seated EOB upon arrival  Transfers Overall transfer level: Independent Equipment used: None                    Ambulation/Gait Ambulation/Gait assistance: Independent Gait Distance (Feet): 400 Feet Assistive device: None Gait Pattern/deviations: WFL(Within Functional Limits) Gait Speed: Pace WFL General Gait Details: Slightly slowed step through pattern. Pt states this is baseline for him. no LOB noted.  Home Activity Instructions    Stairs Stairs: Yes Stairs assistance: Independent Stair Management: Two rails, Alternating pattern,  Forwards Number of Stairs: 10 General stair comments: no LOB noted  Modified Rankin (Stroke Patients Only)        Balance Overall balance assessment: No apparent balance deficits (not formally assessed)                        Pertinent Vitals/Pain PT - Brief Vital Signs All Vital Signs Stable: Yes Pain Assessment Pain Assessment: No/denies pain     Home Living Family/patient expects to be discharged to:: Private residence Living Arrangements: Spouse/significant other Available Help at Discharge: Family;Available 24 hours/day Home Environment: Stairs to enter;Rail - left  Stairs-Number of Steps: 5 Home Equipment: Rolling Walker (2 wheels);BSC/3in1;Shower seat   Additional Comments: 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    UE/LE Assessment   UE ROM/Strength/Tone/Coordination: WFL    LE ROM/Strength/Tone/Coordination: [facility]      Communication   Communication Communication: No apparent difficulties     Cognition Overall Cognitive Status: Appears within functional limits for tasks assessed/performed       General Comments General comments (skin integrity, edema, etc.): VSS    Exercises     Assessment/Plan    PT Problem List Decreased strength;Decreased range of motion;Decreased activity tolerance;Decreased balance;Decreased mobility;Decreased coordination;Decreased knowledge of use of DME;Decreased safety awareness;Decreased knowledge of precautions;Cardiopulmonary status limiting activity       PT Visit Diagnosis Other abnormalities of gait and mobility (R26.89)    No Skilled PT  Co-evaluation                AMPAC 6 Clicks Help needed turning from your back to your side while in a flat bed without using bedrails?: None Help needed moving from lying on your back to sitting on the side of a flat bed  without using bedrails?: None Help needed moving to and from a bed to a chair (including a wheelchair)?: None Help needed standing up from a chair using your arms (e.g., wheelchair or bedside chair)?: None Help needed to walk in hospital room?: None Help needed climbing 3-5 steps with a railing? : None 6 Click Score: 24      End of Session Equipment Utilized During Treatment: Gait belt Activity Tolerance: Patient tolerated treatment well Patient left: in bed;with call bell/phone within reach Nurse Communication: Mobility status PT Visit Diagnosis: Other abnormalities of gait and mobility (R26.89)     Time: 9079-9065 PT Time Calculation (min) (ACUTE ONLY): 14 min  Charges:   PT Evaluation $PT Eval Moderate Complexity: 1 Mod      Anjeli Casad B, PT, DPT Acute Rehab Services [phone]   Chonte Ricke  03/24/2024, 11:45 AM

## 2024-03-24 NOTE — Plan of Care (Signed)
  Problem: Education: Goal: Knowledge of General Education information will improve Description: Including pain rating scale, medication(s)/side effects and non-pharmacologic comfort measures Outcome: Progressing   Problem: Health Behavior/Discharge Planning: Goal: Ability to manage health-related needs will improve Outcome: Progressing   Problem: Clinical Measurements: Goal: Ability to maintain clinical measurements within normal limits will improve Outcome: Progressing Goal: Cardiovascular complication will be avoided Outcome: Progressing   Problem: Nutrition: Goal: Adequate nutrition will be maintained Outcome: Progressing   Problem: Pain Managment: Goal: General experience of comfort will improve and/or be controlled Outcome: Progressing

## 2024-03-24 NOTE — Progress Notes (Signed)
 PROGRESS NOTE    Edwin Dickerson  FMW:[id number] DOB: [birth year] DOA: 03/23/2024 PCP: Marelyn Axon, MD   Brief Narrative: Edwin Dickerson is a 73 y.o. 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  Patient presented secondary to worsening confusion and slurred speech, found to have frontal and parietotemporal subdural hematomas. Neurosurgery consulted. Concern for possible seizure, with Keppra  recommended by neurology.   Assessment and Plan:  Subarachnoid hemorrhage No recent head injury. Patient with confusion and slurred speech which has resolved. 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 Neurosurgery consulted. Repeat MRI significant for stable subarachnoid hemorrhage.  Slurred speech Present on admission with aphasia which has now resolved. Presumed secondary to identified subarachnoid hematoma. EEG ordered. -Follow-up EEG  History of subdural hematoma Patient is s/p craniotomy and middle meningeal artery embolization.  Hyperlipidemia -Continue Lipitor  Primary hypertension -Continue losartan   BPH -Continue finasteride    DVT prophylaxis: SCDs Code Status:   Code Status: Full Code Family Communication: None at bedside Disposition Plan: Discharge home pending specialist recommendations and PT/OT evaluation   Consultants:  Neurosurgery  Procedures:  None  Antimicrobials: None    Subjective: Patient reports no specific concerns this morning.  Objective: BP 118/72 (BP Location: Right Arm)   Pulse (!) 55   Temp 97.7 F (36.5 C)   Resp 19   Ht 5' 7 (1.702 m)   Wt 84.9 kg   SpO2 100%   BMI 29.32 kg/m   Examination:  General exam: Appears calm and comfortable. Respiratory system: Clear to auscultation. Respiratory  effort normal. Cardiovascular system: S1 & S2 heard, RRR. No murmur. Gastrointestinal system: Abdomen is nondistended, soft and nontender. Normal bowel sounds heard. Central nervous system: Alert and oriented. No focal neurological deficits. Musculoskeletal: No edema. No calf tenderness Skin: No cyanosis. No rashes Psychiatry: Judgement and insight appear normal. Mood & affect appropriate.    Data Reviewed: I have personally reviewed following labs and imaging studies  CBC Lab Results  Component Value Date   WBC 6.3 03/24/2024   RBC 3.92 (L) 03/24/2024   HGB 12.0 (L) 03/24/2024   HCT 34.9 (L) 03/24/2024   MCV 89.0 03/24/2024   MCH 30.6 03/24/2024   PLT 190 03/24/2024   MCHC 34.4 03/24/2024   RDW 13.1 03/24/2024   LYMPHSABS 1.2 03/23/2024   MONOABS 0.5 03/23/2024   EOSABS 0.1 03/23/2024   BASOSABS 0.1 03/23/2024     Last metabolic panel Lab Results  Component Value Date   NA 137 03/24/2024   K 3.7 03/24/2024   CL 104 03/24/2024   CO2 24 03/24/2024   BUN 12 03/24/2024   CREATININE 0.99 03/24/2024   GLUCOSE 91 03/24/2024   GFRNONAA >60 03/24/2024   GFRAA >60 10/22/2016   CALCIUM  8.5 (L) 03/24/2024   PHOS 3.3 02/03/2024   PROT 6.4 (L) 03/23/2024   ALBUMIN 3.6 03/23/2024   BILITOT 1.6 (H) 03/23/2024   ALKPHOS 73 03/23/2024   AST 21 03/23/2024   ALT 21 03/23/2024   ANIONGAP 9 03/24/2024    GFR: Estimated Creatinine Clearance: 67.1 mL/min (by C-G formula based on SCr of 0.99 mg/dL).  No results found for this or any previous visit (from the past 240 hours).    Radiology Studies: MR BRAIN WO CONTRAST Result Date: 03/24/2024 EXAM: MRI BRAIN WITHOUT CONTRAST 03/24/2024 02:41:39 AM TECHNIQUE: Multiplanar multisequence MRI of the  head/brain was performed without the administration of intravenous contrast. COMPARISON: MRI Brain from yesterday. head CT yesterday and earlier. CLINICAL HISTORY: 73 year old male with headache and neuro deficit. FINDINGS: BRAIN AND  VENTRICLES: Subluxation of left vertex craniotomy. Middle meningeal artery embolization demonstrated on CT yesterday. Predominantly thin left hemispheric subdural hematoma with focal more lobulated, biconvex extra axial hemorrhage over the left posterior frontal and parietal convexity (series 11, images 32-39). The confluent hematoma measures 13 to 14 mm in thickness and is unchanged from yesterday's MRI. Small volume left hemisphere subarachnoid hemorrhage also apparent on FLAIR. Stable regional mass effect, perirolandic. No confluent underlying cerebral edema. Trace rightward midline shift is stable. No ventriculomegaly. Choroid plexus cysts (normal variant) without convincing intraventricular hemorrhage. Basilar cisterns are patent. Stable gray and white matter signal. No acute infarct or new intracranial abnormality. The sella is unremarkable. Normal flow voids. ORBITS: No acute abnormality. SINUSES AND MASTOIDS: No acute abnormality. BONES AND SOFT TISSUES: Normal marrow signal. No acute soft tissue abnormality. IMPRESSION: 1. Stable combined thin left hemispheric and lobulated focal left parietal convexity subdural hematomas (focal hematoma 1314 mm thick). Small volume left hemisphere subarachnoid hemorrhage also stable. 2. Stable regional mass effect with trace rightward midline shift. 3. No new intracranial abnormality. Electronically signed by: Helayne Hurst MD 03/24/2024 05:29 AM EDT RP Workstation: [id number]   MR BRAIN WO CONTRAST Result Date: 03/23/2024 EXAM: MRI BRAIN WITHOUT CONTRAST 03/23/2024 06:47:11 PM TECHNIQUE: Multiplanar multisequence MRI of the head/brain was performed without the administration of intravenous contrast. COMPARISON: Same day CT Head CLINICAL HISTORY: Neuro deficit, acute, stroke suspected. FINDINGS: BRAIN AND VENTRICLES: No acute infarct. Approximately 12 mm thick left parietal convexity extra-axial fluid collection appears similar when comparing across modalities. Similar  trace rightward midline shift. Small to moderate volume of surrounding frontal, parietal, and temporal convexity FLAIR hyperintensity and susceptibility artifact which is compatible with subarachnoid hemorrhage. No mass. No hydrocephalus. Normal flow voids. ORBITS: No acute abnormality. SINUSES AND MASTOIDS: No acute abnormality. BONES AND SOFT TISSUES: Normal marrow signal. No acute soft tissue abnormality. IMPRESSION: 1. Approximately 12 mm thick left parietal convexity extra-axial fluid collection appears similar when comparing across modalities. Similar trace rightward midline shift. 2. Small to moderate volume of surrounding frontal, parietal, and temporal convexity subarachnoid hemorrhage. This finding may be more conspicuous due to improved visualization by MRI (versus CT), but recommend a short interval follow-up noncontrast head CT to exclude progressive or new subarachnoid hemorrhage. Electronically signed by: Gilmore Molt MD 03/23/2024 08:11 PM EDT RP Workstation: [id number]   CT HEAD WO CONTRAST Result Date: 03/23/2024 EXAM: CT HEAD WITHOUT CONTRAST 03/23/2024 03:23:00 PM TECHNIQUE: CT of the head was performed without the administration of intravenous contrast. Automated exposure control, iterative reconstruction, and/or weight based adjustment of the mA/kV was utilized to reduce the radiation dose to as low as reasonably achievable. COMPARISON: None available. CLINICAL HISTORY: Neuro deficit, acute, stroke suspected. Altered Mental Status; CT HEAD WO CONTRAST; Neuro deficit, acute, stroke suspected; See ED Notes:; Patient's wife states altered mental status and slurred speech for two days. LKW 10/22. States symptoms even worse today. SDH in September and she states these are same symptoms as his presentation then. FINDINGS: BRAIN AND VENTRICLES: A more chronic-appearing subdural collection over the left parietal lobe has decreased in size, now measuring 12 mm maximally on coronal images compared  with 19 mm previously. A more hyperdense collection below the calvarium, possibly representing diffuse dural thickening or a component of the subdural collection, has slightly increased since the  prior study. Residual mass effect is present with effacement of the sulci over the left convexity. 2 mm midline shift has improved. Minimal extradural fluid below the craniotomy site is stable. No acute hemorrhage. No evidence of acute infarct. No hydrocephalus. ORBITS: Bilateral lens replacements are noted. The globes and orbits are otherwise within normal limits. SINUSES: No acute abnormality. SOFT TISSUES AND SKULL: No acute soft tissue abnormality. No skull fracture. IMPRESSION: 1. No acute intracranial abnormality. 2. 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 Electronically signed by: Lonni Necessary MD 03/23/2024 03:32 PM EDT RP Workstation: [id number]      LOS: 0 days    Elgin Lam, MD [facility] 03/24/2024, 3:08 PM   If 7PM-7AM, please contact night-coverage [email or web address]

## 2024-03-24 NOTE — Care Management Obs Status (Signed)
 MEDICARE OBSERVATION STATUS NOTIFICATION   Patient Details  Name: Edwin Dickerson MRN: [id number] Date of Birth: 1950-11-22   Medicare Observation Status Notification Given:  Yes    Carletha Spruce, RN 03/24/2024, 4:21 PM

## 2024-03-24 NOTE — Consult Note (Signed)
 HPI:     Patient is a 73 y.o. male presented to [facility] ED w/ c/o speech difficulty x2 days w/ some potential abnormal hand movement. He has a h/o L crani for evacuation of SDH on 02/01/24, s/p L MMA embolization on 02/17/24. Currently states his speech has improved, no further issues w/ hands.     Patient Active Problem List   Diagnosis Date Noted   Subarachnoid hemorrhage (HCC) 03/23/2024   Orthostatic hypotension 02/15/2024   AKI (acute kidney injury) 02/15/2024   Leukocytosis 02/15/2024   Hyponatremia 02/15/2024   Traumatic subdural hematoma (HCC) 02/06/2024   S/P craniotomy 02/01/2024   SDH (subdural hematoma) (HCC) 02/01/2024   Hypertension 02/01/2024   Hyperlipidemia 02/01/2024   Routine adult health maintenance 12/07/2016   Vision changes    Word finding difficulty    Elevated blood pressure reading    Benign prostatic hyperplasia with lower urinary tract symptoms    Chronic nonintractable headache    TIA (transient ischemic attack) 10/22/2016   Blurred vision, bilateral    Slurred speech    Transient cerebral ischemia    Past Medical History:  Diagnosis Date   High cholesterol    Hypertension    SDH (subdural hematoma) (HCC)    TIA (transient ischemic attack)     Past Surgical History:  Procedure Laterality Date   CRANIOTOMY Left 02/01/2024   Procedure: CRANIOTOMY HEMATOMA EVACUATION SUBDURAL;  Surgeon: Joshua Alm Hamilton, MD;  Location: [facility] OR;  Service: Neurosurgery;  Laterality: Left;   IR ANGIO EXTERNAL CAROTID SEL EXT CAROTID UNI L MOD SED  02/17/2024   IR ANGIO INTRA EXTRACRAN SEL INTERNAL CAROTID UNI L MOD SED  02/22/2024   IR ANGIOGRAM FOLLOW UP STUDY  02/22/2024   IR ANGIOGRAM FOLLOW UP STUDY  02/17/2024   IR NEURO EACH ADD'L AFTER BASIC UNI LEFT (MS)  02/17/2024   IR TRANSCATH/EMBOLIZ  02/17/2024   PARS PLANA REPAIR OF RETINAL DEATACHMENT     RADIOLOGY WITH ANESTHESIA N/A 02/17/2024   Procedure: RADIOLOGY WITH ANESTHESIA;  Surgeon: Lanis Pupa, MD;  Location:  [facility] OR;  Service: Radiology;  Laterality: N/A;  EMBOLIZATION    Medications Prior to Admission  Medication Sig Dispense Refill Last Dose/Taking   acetaminophen  (TYLENOL ) 325 MG tablet Take 2 tablets (650 mg total) by mouth every 4 (four) hours as needed for mild pain (pain score 1-3) (temp > 100.5).   Past Week   atorvastatin  (LIPITOR) 40 MG tablet Take 1 tablet (40 mg total) by mouth daily at 6 PM. (Patient taking differently: Take 40 mg by mouth daily.) 30 tablet 0 03/22/2024 Noon   B Complex-C (B-COMPLEX WITH VITAMIN C) tablet Take 1 tablet by mouth daily. 30 tablet 0 03/23/2024 Morning   cetirizine (ZYRTEC) 10 MG tablet Take 10 mg by mouth daily as needed for allergies.   Past Week   clonazePAM  (KLONOPIN ) 0.5 MG tablet Take 1 tablet (0.5 mg total) by mouth at bedtime. 30 tablet 0 03/22/2024 Bedtime   docusate sodium  (COLACE) 100 MG capsule Take 1 capsule (100 mg total) by mouth daily. (Patient taking differently: Take 100 mg by mouth daily as needed for mild constipation.)   03/23/2024 Morning   finasteride  (PROSCAR ) 5 MG tablet Take 1 tablet (5 mg total) by mouth daily. 30 tablet 0 03/22/2024 Morning   magnesium  gluconate (MAGONATE) 500 (27 Mg) MG TABS tablet Take 0.5 tablets (250 mg total) by mouth daily. (Patient not taking: Reported on 03/23/2024) 30 tablet 0 Not Taking   scopolamine  (TRANSDERM-SCOP)  1 MG/3DAYS Place 1 patch (1 mg total) onto the skin every 3 (three) days. (Patient not taking: Reported on 03/23/2024) 10 patch 0 Not Taking   vitamin D3 (CHOLECALCIFEROL ) 25 MCG tablet Take 1 tablet (1,000 Units total) by mouth daily. (Patient not taking: Reported on 03/23/2024) 30 tablet 0 Not Taking   Allergies  Allergen Reactions   Penicillins Rash    Social History   Tobacco Use   Smoking status: Never   Smokeless tobacco: Never  Substance Use Topics   Alcohol use: No    History reviewed. No pertinent family history.    Objective:   Patient Vitals for the past 8 hrs:  BP Temp  Temp src Pulse Resp SpO2  03/24/24 0742 128/65 97.7 F (36.5 C) Oral (!) 58 18 100 %   No intake/output data recorded. Total I/O In: 240 [P.O.:240] Out: -  MR BRAIN WO CONTRAST Result Date: 03/24/2024 EXAM: MRI BRAIN WITHOUT CONTRAST 03/24/2024 02:41:39 AM TECHNIQUE: Multiplanar multisequence MRI of the head/brain was performed without the administration of intravenous contrast. COMPARISON: MRI Brain from yesterday. head CT yesterday and earlier. CLINICAL HISTORY: 73 year old male with headache and neuro deficit. FINDINGS: BRAIN AND VENTRICLES: Subluxation of left vertex craniotomy. Middle meningeal artery embolization demonstrated on CT yesterday. Predominantly thin left hemispheric subdural hematoma with focal more lobulated, biconvex extra axial hemorrhage over the left posterior frontal and parietal convexity (series 11, images 32-39). The confluent hematoma measures 13 to 14 mm in thickness and is unchanged from yesterday's MRI. Small volume left hemisphere subarachnoid hemorrhage also apparent on FLAIR. Stable regional mass effect, perirolandic. No confluent underlying cerebral edema. Trace rightward midline shift is stable. No ventriculomegaly. Choroid plexus cysts (normal variant) without convincing intraventricular hemorrhage. Basilar cisterns are patent. Stable gray and white matter signal. No acute infarct or new intracranial abnormality. The sella is unremarkable. Normal flow voids. ORBITS: No acute abnormality. SINUSES AND MASTOIDS: No acute abnormality. BONES AND SOFT TISSUES: Normal marrow signal. No acute soft tissue abnormality. IMPRESSION: 1. Stable combined thin left hemispheric and lobulated focal left parietal convexity subdural hematomas (focal hematoma 1314 mm thick). Small volume left hemisphere subarachnoid hemorrhage also stable. 2. Stable regional mass effect with trace rightward midline shift. 3. No new intracranial abnormality. Electronically signed by: Helayne Hurst MD  03/24/2024 05:29 AM EDT RP Workstation: [id number]   MR BRAIN WO CONTRAST Result Date: 03/23/2024 EXAM: MRI BRAIN WITHOUT CONTRAST 03/23/2024 06:47:11 PM TECHNIQUE: Multiplanar multisequence MRI of the head/brain was performed without the administration of intravenous contrast. COMPARISON: Same day CT Head CLINICAL HISTORY: Neuro deficit, acute, stroke suspected. FINDINGS: BRAIN AND VENTRICLES: No acute infarct. Approximately 12 mm thick left parietal convexity extra-axial fluid collection appears similar when comparing across modalities. Similar trace rightward midline shift. Small to moderate volume of surrounding frontal, parietal, and temporal convexity FLAIR hyperintensity and susceptibility artifact which is compatible with subarachnoid hemorrhage. No mass. No hydrocephalus. Normal flow voids. ORBITS: No acute abnormality. SINUSES AND MASTOIDS: No acute abnormality. BONES AND SOFT TISSUES: Normal marrow signal. No acute soft tissue abnormality. IMPRESSION: 1. Approximately 12 mm thick left parietal convexity extra-axial fluid collection appears similar when comparing across modalities. Similar trace rightward midline shift. 2. Small to moderate volume of surrounding frontal, parietal, and temporal convexity subarachnoid hemorrhage. This finding may be more conspicuous due to improved visualization by MRI (versus CT), but recommend a short interval follow-up noncontrast head CT to exclude progressive or new subarachnoid hemorrhage. Electronically signed by: Gilmore Molt MD 03/23/2024 08:11 PM EDT RP  Workstation: [id number]   CT HEAD WO CONTRAST Result Date: 03/23/2024 EXAM: CT HEAD WITHOUT CONTRAST 03/23/2024 03:23:00 PM TECHNIQUE: CT of the head was performed without the administration of intravenous contrast. Automated exposure control, iterative reconstruction, and/or weight based adjustment of the mA/kV was utilized to reduce the radiation dose to as low as reasonably achievable. COMPARISON: None  available. CLINICAL HISTORY: Neuro deficit, acute, stroke suspected. Altered Mental Status; CT HEAD WO CONTRAST; Neuro deficit, acute, stroke suspected; See ED Notes:; Patient's wife states altered mental status and slurred speech for two days. LKW 10/22. States symptoms even worse today. SDH in September and she states these are same symptoms as his presentation then. FINDINGS: BRAIN AND VENTRICLES: A more chronic-appearing subdural collection over the left parietal lobe has decreased in size, now measuring 12 mm maximally on coronal images compared with 19 mm previously. A more hyperdense collection below the calvarium, possibly representing diffuse dural thickening or a component of the subdural collection, has slightly increased since the prior study. Residual mass effect is present with effacement of the sulci over the left convexity. 2 mm midline shift has improved. Minimal extradural fluid below the craniotomy site is stable. No acute hemorrhage. No evidence of acute infarct. No hydrocephalus. ORBITS: Bilateral lens replacements are noted. The globes and orbits are otherwise within normal limits. SINUSES: No acute abnormality. SOFT TISSUES AND SKULL: No acute soft tissue abnormality. No skull fracture. IMPRESSION: 1. No acute intracranial abnormality. 2. 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 Electronically signed by: Lonni Necessary MD 03/23/2024 03:32 PM EDT RP Workstation: [id number]   Awake, alert, oriented Speech fluent, appropriate CN grossly intact 5/5 BUE/BLE SILTx4   Assessment:   Principal Problem:   Subarachnoid hemorrhage (HCC) Active Problems:   SDH (subdural hematoma) (HCC)   Hypertension   Hyperlipidemia  This is a 73yo male with h/o L crani for evacuation of SDH on 02/01/24, s/p L MMA embolization on 02/17/24 with improvement in size of SDH and MLS noted on updated imaging. Small SAH noted  on MRI, stable on repeat scan.   Plan:   -Continue further evaluation and mgmt per [facility], Neurology.  -No acute neurosurgical intervention indicated - f/u outpatient.   Paden Kuras CAYLIN Charles Niese, PA-C

## 2024-03-24 NOTE — Plan of Care (Signed)
  Problem: Education: Goal: Knowledge of General Education information will improve Description: Including pain rating scale, medication(s)/side effects and non-pharmacologic comfort measures Outcome: Progressing   Problem: Health Behavior/Discharge Planning: Goal: Ability to manage health-related needs will improve Outcome: Progressing   Problem: Clinical Measurements: Goal: Ability to maintain clinical measurements within normal limits will improve Outcome: Progressing   Problem: Pain Managment: Goal: General experience of comfort will improve and/or be controlled Outcome: Progressing   Problem: Safety: Goal: Ability to remain free from injury will improve Outcome: Progressing

## 2024-03-24 NOTE — Progress Notes (Signed)
 Routine EEG completed, results pending Neurology review and interpretation

## 2024-03-25 ENCOUNTER — Observation Stay (HOSPITAL_COMMUNITY)

## 2024-03-25 DIAGNOSIS — R4182 Altered mental status, unspecified: Secondary | ICD-10-CM

## 2024-03-25 DIAGNOSIS — R569 Unspecified convulsions: Secondary | ICD-10-CM | POA: Diagnosis present

## 2024-03-25 DIAGNOSIS — G9389 Other specified disorders of brain: Secondary | ICD-10-CM | POA: Diagnosis not present

## 2024-03-25 DIAGNOSIS — Z79899 Other long term (current) drug therapy: Secondary | ICD-10-CM | POA: Diagnosis not present

## 2024-03-25 DIAGNOSIS — E7800 Pure hypercholesterolemia, unspecified: Secondary | ICD-10-CM | POA: Diagnosis present

## 2024-03-25 DIAGNOSIS — I69022 Dysarthria following nontraumatic subarachnoid hemorrhage: Secondary | ICD-10-CM | POA: Diagnosis not present

## 2024-03-25 DIAGNOSIS — R471 Dysarthria and anarthria: Secondary | ICD-10-CM | POA: Diagnosis present

## 2024-03-25 DIAGNOSIS — Z9889 Other specified postprocedural states: Secondary | ICD-10-CM | POA: Diagnosis not present

## 2024-03-25 DIAGNOSIS — I6200 Nontraumatic subdural hemorrhage, unspecified: Secondary | ICD-10-CM

## 2024-03-25 DIAGNOSIS — R4781 Slurred speech: Secondary | ICD-10-CM | POA: Diagnosis present

## 2024-03-25 DIAGNOSIS — I609 Nontraumatic subarachnoid hemorrhage, unspecified: Secondary | ICD-10-CM | POA: Diagnosis not present

## 2024-03-25 DIAGNOSIS — I69020 Aphasia following nontraumatic subarachnoid hemorrhage: Secondary | ICD-10-CM | POA: Diagnosis not present

## 2024-03-25 DIAGNOSIS — I69051 Hemiplegia and hemiparesis following nontraumatic subarachnoid hemorrhage affecting right dominant side: Secondary | ICD-10-CM | POA: Diagnosis not present

## 2024-03-25 DIAGNOSIS — S065XAA Traumatic subdural hemorrhage with loss of consciousness status unknown, initial encounter: Secondary | ICD-10-CM | POA: Diagnosis not present

## 2024-03-25 DIAGNOSIS — R29709 NIHSS score 9: Secondary | ICD-10-CM | POA: Diagnosis not present

## 2024-03-25 DIAGNOSIS — R509 Fever, unspecified: Secondary | ICD-10-CM | POA: Diagnosis present

## 2024-03-25 DIAGNOSIS — I69098 Other sequelae following nontraumatic subarachnoid hemorrhage: Secondary | ICD-10-CM | POA: Diagnosis not present

## 2024-03-25 DIAGNOSIS — I69028 Other speech and language deficits following nontraumatic subarachnoid hemorrhage: Secondary | ICD-10-CM | POA: Diagnosis not present

## 2024-03-25 DIAGNOSIS — I951 Orthostatic hypotension: Secondary | ICD-10-CM | POA: Diagnosis present

## 2024-03-25 DIAGNOSIS — H538 Other visual disturbances: Secondary | ICD-10-CM | POA: Diagnosis present

## 2024-03-25 DIAGNOSIS — E669 Obesity, unspecified: Secondary | ICD-10-CM | POA: Diagnosis present

## 2024-03-25 DIAGNOSIS — N400 Enlarged prostate without lower urinary tract symptoms: Secondary | ICD-10-CM | POA: Diagnosis present

## 2024-03-25 DIAGNOSIS — Z880 Allergy status to penicillin: Secondary | ICD-10-CM | POA: Diagnosis not present

## 2024-03-25 DIAGNOSIS — I10 Essential (primary) hypertension: Secondary | ICD-10-CM | POA: Diagnosis present

## 2024-03-25 DIAGNOSIS — I6203 Nontraumatic chronic subdural hemorrhage: Secondary | ICD-10-CM | POA: Diagnosis present

## 2024-03-25 LAB — GLUCOSE, CAPILLARY
Glucose-Capillary: 90 mg/dL (ref 70–99)
Glucose-Capillary: 95 mg/dL (ref 70–99)

## 2024-03-25 MED ORDER — LEVETIRACETAM (KEPPRA) 500 MG/5 ML ADULT IV PUSH
20.0000 mg/kg | Freq: Once | INTRAVENOUS | Status: AC
  Administered 2024-03-25: 1750 mg via INTRAVENOUS
  Filled 2024-03-25: qty 20

## 2024-03-25 NOTE — Progress Notes (Addendum)
 PROGRESS NOTE    Edwin Dickerson  FMW:[id number] DOB: [birth year] DOA: 03/23/2024 PCP: Marelyn Axon, MD   Brief Narrative: Edwin Dickerson is a 73 y.o. 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  Patient presented secondary to worsening confusion and slurred speech, found to have a frontal and parietotemporal subarachnoid hemorrhage. Neurosurgery consulted. Concern for possible seizure, with Keppra  recommended by neurology.   Assessment and Plan:  Subarachnoid hemorrhage No recent head injury. Patient with confusion and slurred speech which has resolved. 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 Neurosurgery consulted. Repeat MRI significant for stable subarachnoid hemorrhage.  Slurred speech Present on admission with aphasia which has now resolved. Presumed secondary to identified subarachnoid hematoma. EEG ordered and unremarkable for seizures.  Altered mental status Patient with episode of change in mental status on 10/26 involving dysarthria and aphasia, in addition to right sided weakness. Concern for worsening hemorrhage vs seizures. Code stroke called and CT head significant for stable hematoma/SAH. Neurology started patient on Keppra  load and continuous EEG ordered. -Continue Keppra  -Follow-up EEG results  History of subdural hematoma Patient is s/p craniotomy and middle meningeal artery embolization.  Hyperlipidemia -Continue Lipitor  Primary hypertension -Continue losartan   BPH -Continue finasteride    DVT prophylaxis: SCDs Code Status:   Code Status: Full Code Family Communication: None at bedside Disposition Plan: Discharge home pending specialist recommendations and PT/OT evaluation   Consultants:   Neurosurgery  Procedures:  None  Antimicrobials: None    Subjective: Called by nursing secondary to change in patient's mental status that occurred around 10:00 AM. Patient with dysarthria, aphasia and right-sided weakness. Complicated by known SDH and SAH. Per wife, these are similar episodes as he has at home.  Objective: BP 114/68 (BP Location: Left Arm)   Pulse (!) 58   Temp 97.7 F (36.5 C)   Resp 20   Ht 5' 7 (1.702 m)   Wt 84.9 kg   SpO2 95%   BMI 29.32 kg/m   Examination:  General exam: Appears calm and comfortable. Respiratory system: Clear to auscultation. Respiratory effort normal. Cardiovascular system: S1 & S2 heard, RRR. No murmur. Gastrointestinal system: Abdomen is nondistended, soft and nontender. Normal bowel sounds heard. Central nervous system: Alert. Unable to voice his name. Patient repeats a specific phrase. 4/5 right-sided strength compared to 5/5 on left. Patient's tongue appears to deviate to the left but unclear if patient is just not following commands appropriately. Right upper extremity drift noted. Musculoskeletal: No edema. No calf tenderness Skin: No cyanosis. No rashes Psychiatry: Judgement and insight appear normal. Mood & affect appropriate.    Data Reviewed: I have personally reviewed following labs and imaging studies  CBC Lab Results  Component Value Date   WBC 6.3 03/24/2024   RBC 3.92 (L) 03/24/2024   HGB 12.0 (L) 03/24/2024   HCT 34.9 (L) 03/24/2024   MCV 89.0 03/24/2024   MCH 30.6 03/24/2024   PLT 190 03/24/2024   MCHC 34.4 03/24/2024   RDW 13.1 03/24/2024   LYMPHSABS 1.2 03/23/2024   MONOABS 0.5 03/23/2024   EOSABS 0.1 03/23/2024   BASOSABS 0.1 03/23/2024     Last metabolic panel Lab Results  Component Value Date   NA 137 03/24/2024   K 3.7 03/24/2024   CL 104 03/24/2024   CO2 24 03/24/2024   BUN 12 03/24/2024  CREATININE 0.99 03/24/2024   GLUCOSE 91 03/24/2024   GFRNONAA >60 03/24/2024   GFRAA >60  10/22/2016   CALCIUM  8.5 (L) 03/24/2024   PHOS 3.3 02/03/2024   PROT 6.4 (L) 03/23/2024   ALBUMIN 3.6 03/23/2024   BILITOT 1.6 (H) 03/23/2024   ALKPHOS 73 03/23/2024   AST 21 03/23/2024   ALT 21 03/23/2024   ANIONGAP 9 03/24/2024    GFR: Estimated Creatinine Clearance: 67.1 mL/min (by C-G formula based on SCr of 0.99 mg/dL).  No results found for this or any previous visit (from the past 240 hours).    Radiology Studies: CT HEAD CODE STROKE WO CONTRAST Result Date: 03/25/2024 EXAM: CT HEAD WITHOUT 03/25/2024 10:58:42 AM TECHNIQUE: CT of the head was performed without the administration of intravenous contrast. Automated exposure control, iterative reconstruction, and/or weight based adjustment of the mA/kV was utilized to reduce the radiation dose to as low as reasonably achievable. COMPARISON: None available. CLINICAL HISTORY: Neuro deficit, acute, stroke suspected. FINDINGS: BRAIN AND VENTRICLES: The thin convexity subdural hematoma and more lobulated left parietal subdural collection are stable. Minimal subarachnoid blood is stable. 2 mm midline shift is similar to the prior exam. No evidence of acute infarct. No hydrocephalus. ORBITS: No acute abnormality. SINUSES AND MASTOIDS: No acute abnormality. SOFT TISSUES AND SKULL: No acute skull fracture. No acute soft tissue abnormality. [place] stroke program early CT score (aspects) ----- Ganglionic (caudate, ic, lentiform nucleus, insula, M1-m3): 7 Supraganglionic (m4-m6): 3 Total: 10 IMPRESSION: 1. Stable thin convexity subdural hematoma, more lobulated left parietal subdural collection, and minimal subarachnoid hemorrhage. 2. Stable 2 mm midline shift. 3. ASPECTS: 10. 4. These results were communicated to Dr. Vanessa at 11:04 AM on by secure text page via the [facility] messaging system. Electronically signed by: Lonni Necessary MD 03/25/2024 11:05 AM EDT RP Workstation: [id number]   EEG adult Result Date: 03/24/2024 Matthews Elida HERO, MD      03/25/2024  8:49 AM Routine EEG Report Edwin Dickerson is a 73 y.o. male with a history of altered mental status who is undergoing an EEG to evaluate for seizures. Report: This EEG was acquired with electrodes placed according to the International 10-20 electrode system (including Fp1, Fp2, F3, F4, C3, C4, P3, P4, O1, O2, T3, T4, T5, T6, A1, A2, Fz, Cz, Pz). The following electrodes were missing or displaced: none. The occipital dominant rhythm was 6-7 Hz. There is a breach rhythm located over the left frontoparietal region. This activity is reactive to stimulation. Drowsiness was manifested by background fragmentation; deeper stages of sleep were identified by K complexes and sleep spindles. There was no focal slowing. There were no interictal epileptiform discharges. There were no electrographic seizures identified. Photic stimulation and hyperventilation were not performed. Impression and clinical correlation: This EEG was obtained while awake and asleep and is abnormal due to: - mild diffuse slowing indicative of global cerebral dysfunction - breach rhythm over the left frontoparietal region consistent with post-operative skull defect Epileptiform abnormalities were not seen during this recording. Elida Matthews, MD [facility] [phone] If 7pm- 7am, please page neurology on call as listed in AMION.   MR BRAIN WO CONTRAST Result Date: 03/24/2024 EXAM: MRI BRAIN WITHOUT CONTRAST 03/24/2024 02:41:39 AM TECHNIQUE: Multiplanar multisequence MRI of the head/brain was performed without the administration of intravenous contrast. COMPARISON: MRI Brain from yesterday. head CT yesterday and earlier. CLINICAL HISTORY: 73 year old male with headache and neuro deficit. FINDINGS: BRAIN AND VENTRICLES: Subluxation of left vertex craniotomy. Middle meningeal artery embolization demonstrated on CT  yesterday. Predominantly thin left hemispheric subdural hematoma with focal more lobulated, biconvex extra axial  hemorrhage over the left posterior frontal and parietal convexity (series 11, images 32-39). The confluent hematoma measures 13 to 14 mm in thickness and is unchanged from yesterday's MRI. Small volume left hemisphere subarachnoid hemorrhage also apparent on FLAIR. Stable regional mass effect, perirolandic. No confluent underlying cerebral edema. Trace rightward midline shift is stable. No ventriculomegaly. Choroid plexus cysts (normal variant) without convincing intraventricular hemorrhage. Basilar cisterns are patent. Stable gray and white matter signal. No acute infarct or new intracranial abnormality. The sella is unremarkable. Normal flow voids. ORBITS: No acute abnormality. SINUSES AND MASTOIDS: No acute abnormality. BONES AND SOFT TISSUES: Normal marrow signal. No acute soft tissue abnormality. IMPRESSION: 1. Stable combined thin left hemispheric and lobulated focal left parietal convexity subdural hematomas (focal hematoma 1314 mm thick). Small volume left hemisphere subarachnoid hemorrhage also stable. 2. Stable regional mass effect with trace rightward midline shift. 3. No new intracranial abnormality. Electronically signed by: Helayne Hurst MD 03/24/2024 05:29 AM EDT RP Workstation: [id number]   MR BRAIN WO CONTRAST Result Date: 03/23/2024 EXAM: MRI BRAIN WITHOUT CONTRAST 03/23/2024 06:47:11 PM TECHNIQUE: Multiplanar multisequence MRI of the head/brain was performed without the administration of intravenous contrast. COMPARISON: Same day CT Head CLINICAL HISTORY: Neuro deficit, acute, stroke suspected. FINDINGS: BRAIN AND VENTRICLES: No acute infarct. Approximately 12 mm thick left parietal convexity extra-axial fluid collection appears similar when comparing across modalities. Similar trace rightward midline shift. Small to moderate volume of surrounding frontal, parietal, and temporal convexity FLAIR hyperintensity and susceptibility artifact which is compatible with subarachnoid hemorrhage. No mass.  No hydrocephalus. Normal flow voids. ORBITS: No acute abnormality. SINUSES AND MASTOIDS: No acute abnormality. BONES AND SOFT TISSUES: Normal marrow signal. No acute soft tissue abnormality. IMPRESSION: 1. Approximately 12 mm thick left parietal convexity extra-axial fluid collection appears similar when comparing across modalities. Similar trace rightward midline shift. 2. Small to moderate volume of surrounding frontal, parietal, and temporal convexity subarachnoid hemorrhage. This finding may be more conspicuous due to improved visualization by MRI (versus CT), but recommend a short interval follow-up noncontrast head CT to exclude progressive or new subarachnoid hemorrhage. Electronically signed by: Gilmore Molt MD 03/23/2024 08:11 PM EDT RP Workstation: [id number]   CT HEAD WO CONTRAST Result Date: 03/23/2024 EXAM: CT HEAD WITHOUT CONTRAST 03/23/2024 03:23:00 PM TECHNIQUE: CT of the head was performed without the administration of intravenous contrast. Automated exposure control, iterative reconstruction, and/or weight based adjustment of the mA/kV was utilized to reduce the radiation dose to as low as reasonably achievable. COMPARISON: None available. CLINICAL HISTORY: Neuro deficit, acute, stroke suspected. Altered Mental Status; CT HEAD WO CONTRAST; Neuro deficit, acute, stroke suspected; See ED Notes:; Patient's wife states altered mental status and slurred speech for two days. LKW 10/22. States symptoms even worse today. SDH in September and she states these are same symptoms as his presentation then. FINDINGS: BRAIN AND VENTRICLES: A more chronic-appearing subdural collection over the left parietal lobe has decreased in size, now measuring 12 mm maximally on coronal images compared with 19 mm previously. A more hyperdense collection below the calvarium, possibly representing diffuse dural thickening or a component of the subdural collection, has slightly increased since the prior study. Residual  mass effect is present with effacement of the sulci over the left convexity. 2 mm midline shift has improved. Minimal extradural fluid below the craniotomy site is stable. No acute hemorrhage. No evidence of acute infarct. No hydrocephalus. ORBITS: Bilateral lens  replacements are noted. The globes and orbits are otherwise within normal limits. SINUSES: No acute abnormality. SOFT TISSUES AND SKULL: No acute soft tissue abnormality. No skull fracture. IMPRESSION: 1. No acute intracranial abnormality. 2. 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 Electronically signed by: Lonni Necessary MD 03/23/2024 03:32 PM EDT RP Workstation: [id number]      LOS: 0 days   CRITICAL CARE Performed by: Elgin Lam, MD   Total critical care time: 40 minutes  Critical care time was exclusive of separately billable procedures and treating other patients.  Critical care was necessary to treat or prevent imminent or life-threatening deterioration.  Critical care was time spent personally by me on the following activities: development of treatment plan with patient and/or surrogate as well as nursing, discussions with consultants, evaluation of patient's response to treatment, examination of patient, obtaining history from patient or surrogate, ordering and performing treatments and interventions, ordering and review of laboratory studies, ordering and review of radiographic studies, pulse oximetry and re-evaluation of patient's condition.   Elgin Lam, MD [facility] 03/25/2024, 1:23 PM   If 7PM-7AM, please contact night-coverage [email or web address]

## 2024-03-25 NOTE — Evaluation (Signed)
 Occupational Therapy Evaluation Patient Details Name: Edwin Dickerson MRN: [id number] DOB: 1950-08-14 Today's Date: 03/25/2024   History of Present Illness   Patient is a 73 y.o. male presented to [facility] ED w/ c/o speech difficulty x2 days w/ some potential abnormal hand movement. He has a h/o L crani for evacuation of SDH on 02/01/24, s/p L MMA embolization on 02/17/24.     Clinical Impressions Patient lives with his wife in a 2 level home and is Independent in ADLs and IADLs with no AD for functional mobility.  Patient currently presents as being Independent for ADLs and functional mobility.  No further OT intervention needed at this time.     If plan is discharge home, recommend the following:         Functional Status Assessment   Patient has had a recent decline in their functional status and demonstrates the ability to make significant improvements in function in a reasonable and predictable amount of time.     Equipment Recommendations   None recommended by OT     Recommendations for Other Services         Precautions/Restrictions   Precautions Precautions: Fall Recall of Precautions/Restrictions: Intact Restrictions Weight Bearing Restrictions Per Provider Order: No     Mobility Bed Mobility Overal bed mobility: Independent                  Transfers Overall transfer level: Independent Equipment used: None                      Balance Overall balance assessment: Independent                                         ADL either performed or assessed with clinical judgement   ADL Overall ADL's : Independent                                             Vision Patient Visual Report: Blurring of vision (patient reports recent eye surgery for retinal detachment)       Perception         Praxis Praxis: WFL       Pertinent Vitals/Pain Pain Assessment Pain Assessment: No/denies pain      Extremity/Trunk Assessment Upper Extremity Assessment Upper Extremity Assessment: Overall WFL for tasks assessed   Lower Extremity Assessment Lower Extremity Assessment: Defer to PT evaluation   Cervical / Trunk Assessment Cervical / Trunk Assessment: Normal   Communication Communication Communication: No apparent difficulties   Cognition Arousal: Alert Behavior During Therapy: WFL for tasks assessed/performed Cognition: No apparent impairments                               Following commands: Intact       Cueing  General Comments          Exercises     Shoulder Instructions      Home Living Family/patient expects to be discharged to:: Private residence Living Arrangements: Spouse/significant other Available Help at Discharge: Family;Available 24 hours/day Type of Home: House Home Access: Stairs to enter [employer] of Steps: 5 Entrance Stairs-Rails: Left Home Layout: Two level;Able to live on main level with bedroom/bathroom  Bathroom Shower/Tub: Theme Park Manager: Yes How Accessible: Accessible via walker Home Equipment: Agricultural Consultant (2 wheels);BSC/3in1;Shower seat   Additional Comments: 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, 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      Prior Functioning/Environment Prior Level of Function : Independent/Modified Independent;History of Falls (last six months)             Mobility Comments: Ind with no AD after CIR ADLs Comments: Ind    OT Problem List:     OT Treatment/Interventions:        OT Goals(Current goals can be found in the care plan section)   Acute Rehab OT Goals OT Goal Formulation: With patient Time For Goal Achievement: 03/25/24 Potential to Achieve Goals: Good   OT Frequency:       Co-evaluation              AM-PAC OT 6 Clicks Daily Activity     Outcome Measure Help from another person  eating meals?: None Help from another person taking care of personal grooming?: None Help from another person toileting, which includes using toliet, bedpan, or urinal?: None Help from another person bathing (including washing, rinsing, drying)?: None Help from another person to put on and taking off regular upper body clothing?: None Help from another person to put on and taking off regular lower body clothing?: None 6 Click Score: 24   End of Session    Activity Tolerance: Patient tolerated treatment well Patient left: in bed;with call bell/phone within reach  OT Visit Diagnosis: Muscle weakness (generalized) (M62.81)                Time: 8748-8696 OT Time Calculation (min): 12 min Charges:  OT General Charges $OT Visit: 1 Visit OT Evaluation $OT Eval Low Complexity: 1 Low [name] OT/L  [name] 03/25/2024, 2:40 PM

## 2024-03-25 NOTE — Consult Note (Signed)
 NEUROLOGY CONSULT NOTE   Date of service: March 25, 2024 Patient Name: Edwin Dickerson MRN:  [id number] DOB:  Oct 01, 1950 Chief Complaint: Inpatient code stroke Requesting Provider: Briana Elgin LABOR, MD  History of Present Illness  Edwin Dickerson is a 73 y.o. male with hx of HLD, SDH status postcraniotomy and middle meningeal artery embolization September 2025, frontal/parietotemporal subarachnoid hemorrhage diagnosed after presenting 10/24 due to 2 days of worsening confusion and slurred speech.   Inpatient code stroke was called on patient due to worsening confusion, slurred speech, aphasia.  On exam, patient does not follow commands, does not respond to orientation questions, right arm weakness, does not withdraw to pain in right arm.  Throughout exam, patient did began speaking, but continued to have aphasia. patient's wife stated he was at baseline this morning and then acutely was unable to follow commands or speak back to her.  She stated that patient has had multiple episodes like this, with the last one happening on Friday.    Patient had routine EEG yesterday that was negative.  Stat CT head showed stable subdural hematoma, minimal subarachnoid, stable 2 mm midline shift.  LKW: 1000 Modified rankin score: 2-Slight disability-UNABLE to perform all activities but does not need assistance IV Thrombolysis: No, not a candidate due to recent SDH/SAH EVT: No, no LVO suspected  NIHSS components Score: Comment  1a Level of Conscious 0[]  1[]  2[]  3[]      1b LOC Questions 0[]  1[]  2[x]       1c LOC Commands 0[]  1[]  2[x]       2 Best Gaze 0[x]  1[]  2[]       3 Visual 0[x]  1[]  2[]  3[]      4 Facial Palsy 0[x]  1[]  2[]  3[]      5a Motor Arm - left 0[x]  1[]  2[]  3[]  4[]  UN[]    5b Motor Arm - Right 0[]  1[x]  2[]  3[]  4[]  UN[]    6a Motor Leg - Left 0[]  1[]  2[]  3[]  4[]  UN[]    6b Motor Leg - Right 0[]  1[]  2[]  3[]  4[]  UN[]    7 Limb Ataxia 0[]  1[]  2[]  UN[]      8 Sensory 0[]  1[x]  2[]  UN[]      9 Best Language  0[]  1[]  2[x]  3[]      10 Dysarthria 0[]  1[x]  2[]  UN[]      11 Extinct. and Inattention 0[]  1[]  2[]       TOTAL:   9      ROS   Unable to ascertain due to AMS  Past History   Past Medical History:  Diagnosis Date   High cholesterol    Hypertension    SDH (subdural hematoma) (HCC)    TIA (transient ischemic attack)     Past Surgical History:  Procedure Laterality Date   CRANIOTOMY Left 02/01/2024   Procedure: CRANIOTOMY HEMATOMA EVACUATION SUBDURAL;  Surgeon: Joshua Alm Hamilton, MD;  Location: [facility] OR;  Service: Neurosurgery;  Laterality: Left;   IR ANGIO EXTERNAL CAROTID SEL EXT CAROTID UNI L MOD SED  02/17/2024   IR ANGIO INTRA EXTRACRAN SEL INTERNAL CAROTID UNI L MOD SED  02/22/2024   IR ANGIOGRAM FOLLOW UP STUDY  02/22/2024   IR ANGIOGRAM FOLLOW UP STUDY  02/17/2024   IR NEURO EACH ADD'L AFTER BASIC UNI LEFT (MS)  02/17/2024   IR TRANSCATH/EMBOLIZ  02/17/2024   PARS PLANA REPAIR OF RETINAL DEATACHMENT     RADIOLOGY WITH ANESTHESIA N/A 02/17/2024   Procedure: RADIOLOGY WITH ANESTHESIA;  Surgeon: Lanis Pupa, MD;  Location: [facility] OR;  Service: Radiology;  Laterality:  N/A;  EMBOLIZATION    Family History: History reviewed. No pertinent family history.  Social History  reports that he has never smoked. He has never used smokeless tobacco. He reports that he does not drink alcohol and does not use drugs.  Allergies  Allergen Reactions   Penicillins Rash    Medications   Current Facility-Administered Medications:    acetaminophen  (TYLENOL ) tablet 650 mg, 650 mg, Oral, Q6H PRN **OR** acetaminophen  (TYLENOL ) suppository 650 mg, 650 mg, Rectal, Q6H PRN, Danford, Lonni SQUIBB, MD   atorvastatin  (LIPITOR) tablet 40 mg, 40 mg, Oral, q1800, Danford, Lonni SQUIBB, MD, 40 mg at 03/24/24 1810   finasteride  (PROSCAR ) tablet 5 mg, 5 mg, Oral, Daily, Danford, Lonni SQUIBB, MD, 5 mg at 03/25/24 0903   LORazepam (ATIVAN) tablet 0.5 mg, 0.5 mg, Oral, Once PRN, Danford, Lonni SQUIBB, MD    ondansetron  (ZOFRAN ) tablet 4 mg, 4 mg, Oral, Q6H PRN **OR** ondansetron  (ZOFRAN ) injection 4 mg, 4 mg, Intravenous, Q6H PRN, Jonel Lonni SQUIBB, MD  Vitals   Vitals:   03/24/24 2100 03/25/24 0444 03/25/24 0731 03/25/24 1030  BP: 118/70 114/64 121/64 124/82  Pulse: 60 (!) 54 (!) 58 69  Resp: 17 16 20 14   Temp: 97.7 F (36.5 C) 97.8 F (36.6 C) 98.1 F (36.7 C) (!) 97.3 F (36.3 C)  TempSrc: Oral Oral    SpO2: 98% 95% 97% 97%  Weight:      Height:        Body mass index is 29.32 kg/m.   Physical Exam   Constitutional: Appears well-developed and well-nourished.   Neurologic Examination   Neuro: Mental Status: Patient is awake.  He does not respond to orientation questions.  He does not follow commands.  Cranial Nerves: II: Visual Fields are full. Pupils are equal, round, and reactive to light.   III,IV, VI: EOMI without ptosis or diploplia.  VII: Facial movement is symmetric.  VIII: hearing is intact to voice X: Uvula elevates symmetrically XI: Shoulder shrug is symmetric. XII: tongue is midline without atrophy or fasciculations.  Motor: Tone is normal. Bulk is normal.  RUE: 4 -/5 throughout, 4 -/5 grip Sensory: Sensation is reduced on right arm. Cerebellar: Unable to assess as patient is not following commands.  No overt ataxia noted.   Labs/Imaging/Neurodiagnostic studies   CBC:  Recent Labs  Lab 04/05/24 1517 04/05/24 1525 03/24/24 0305  WBC 6.8  --  6.3  NEUTROABS 4.9  --   --   HGB 12.9* 12.6* 12.0*  HCT 38.5* 37.0* 34.9*  MCV 91.0  --  89.0  PLT 198  --  190   Basic Metabolic Panel:  Lab Results  Component Value Date   NA 137 03/24/2024   K 3.7 03/24/2024   CO2 24 03/24/2024   GLUCOSE 91 03/24/2024   BUN 12 03/24/2024   CREATININE 0.99 03/24/2024   CALCIUM  8.5 (L) 03/24/2024   GFRNONAA >60 03/24/2024   GFRAA >60 10/22/2016   Lipid Panel:  Lab Results  Component Value Date   LDLCALC 171 (H) 10/23/2016   HgbA1c:  Lab Results   Component Value Date   HGBA1C 5.0 02/13/2024   Urine Drug Screen:     Component Value Date/Time   LABOPIA NONE DETECTED April 05, 2024 1508   COCAINSCRNUR NONE DETECTED April 05, 2024 1508   LABBENZ NONE DETECTED 04/05/2024 1508   AMPHETMU NONE DETECTED April 05, 2024 1508   THCU NONE DETECTED 04-05-2024 1508   LABBARB NONE DETECTED 2024/04/05 1508    Alcohol Level  Component Value Date/Time   [facility] <15 03/23/2024 1517   INR  Lab Results  Component Value Date   INR 1.0 03/23/2024   APTT  Lab Results  Component Value Date   APTT 27 03/23/2024   AED levels: No results found for: PHENYTOIN, ZONISAMIDE, LAMOTRIGINE, LEVETIRACETA  CT Head without contrast(Personally reviewed): Stable thin convexity subdural hematoma, more lobulated left parietal subdural collection and minimal subarachnoid hemorrhage Stable 2 mm midline shift  MRI Brain(Personally reviewed): Stable combined then left hemispheric and lobulated focal left parietal convexity subdural hematomas.   Small volume left hemisphere subarachnoid hemorrhage, stable. Stable regional mass effect with trace rightward midline shift No new intracranial abnormality  Neurodiagnostics rEEG:  Mild diffuse slowing indicative of global cerebral dysfunction.  Breach rhythm over left frontoparietal region consistent with postoperative skull defect.  ASSESSMENT   Eh Mistry is a 73 y.o. male hx of HLD, SDH status postcraniotomy and middle meningeal artery embolization September 2025, frontal/parietotemporal subarachnoid hemorrhage diagnosed after presenting 10/24 due to 2 days of worsening confusion and slurred speech. Inpatient code stroke was called on patient due to worsening confusion, slurred speech, aphasia.  With the wife's description of this type of episode happening prior including Friday and a couple times before that and patient's increased risk of seizures due to subdural hematomas status post craniotomy think it  would make sense to go ahead and load him with Keppra  and put him up on continuous EEG.  He did have a routine EEG that did not show seizures yesterday.  However patient had no episode matching the this description at any time yesterday encourage RN/family members to hit event button if these occur again.  RECOMMENDATIONS   - Keppra  load 20/mg/kg - Continuous EEG for 24 hours - would still do Keppra  500mg  BID if LTM EEG fails to capture spell overnight.(Not ordered) ______________________________________________________________________    Signed, Rocky JAYSON Likes, NP [facility]  NEUROHOSPITALIST ADDENDUM Performed a face to face diagnostic evaluation.   I have reviewed the contents of history and physical exam as documented by PA/ARNP/Resident and agree with above documentation.  I have discussed and formulated the above plan as documented. Edits to the note have been made as needed.  Avan Gullett, MD [facility] [phone]   If 7pm to 7am, please call on call as listed on AMION.

## 2024-03-25 NOTE — Code Documentation (Signed)
 Stroke Response Nurse Documentation Code Documentation  Kaymen Upchurch is a 73 y.o. male admitted to [facility]  with past medical hx of hyperlipidemia, obesity, chronic orthostasis, subdural hematoma s/p craniotomy and middle meningeal artery embolization. On No antithrombotic. Code stroke was activated by bedside provider.   Patient was LKW at 10:00 and now complaining of aphasia. Pt wife states he was at baseline then began c/o RUE numbness followed by aphasia and inability to follow commands.   Stroke team to bedside Patient to CT with team. NIHSS 10, see documentation for details and code stroke times. The following imaging was completed:  CT Head. Patient is not a candidate for IV Thrombolytic due to recent SDH. Patient is not a candidate for IR due to no LVO suspected.   Care Plan: Code Stroke cancelled at 1104. Plan to load with Keppra  and continuous EEG ordered.    Bedside handoff with 2W RN.    Nat Blunt  Stroke Response RN

## 2024-03-25 NOTE — Procedures (Signed)
 Routine EEG Report  Edwin Dickerson is a 73 y.o. male with a history of altered mental status who is undergoing an EEG to evaluate for seizures.  Report: This EEG was acquired with electrodes placed according to the International 10-20 electrode system (including Fp1, Fp2, F3, F4, C3, C4, P3, P4, O1, O2, T3, T4, T5, T6, A1, A2, Fz, Cz, Pz). The following electrodes were missing or displaced: none.  The occipital dominant rhythm was 6-7 Hz. There is a breach rhythm located over the left frontoparietal region. This activity is reactive to stimulation. Drowsiness was manifested by background fragmentation; deeper stages of sleep were identified by K complexes and sleep spindles. There was no focal slowing. There were no interictal epileptiform discharges. There were no electrographic seizures identified. Photic stimulation and hyperventilation were not performed.  Impression and clinical correlation: This EEG was obtained while awake and asleep and is abnormal due to: - mild diffuse slowing indicative of global cerebral dysfunction - breach rhythm over the left frontoparietal region consistent with post-operative skull defect  Epileptiform abnormalities were not seen during this recording.  Elida Ross, MD [facility] [phone]  If 7pm- 7am, please page neurology on call as listed in AMION.

## 2024-03-25 NOTE — Progress Notes (Signed)
 LTM EEG hooked up and running - no initial skin breakdown - push button tested - [facility] monitoring.

## 2024-03-25 NOTE — Progress Notes (Signed)
 1030: RN called to room by patient's wife for aphasia. VSS. Upon arrival to room, patient unable to follow commands. Neuro check completed as documented in flowsheet. Rapid response paged. Primary MD Dr. Briana paged and en route to bedside. CT ordered and completed. IV Keppra  and continuous EEG ordered.  1300: Patient back to baseline. Patient stated during spell, he was able to hear and understand everything but just couldn't get the words out.

## 2024-03-26 ENCOUNTER — Inpatient Hospital Stay (HOSPITAL_COMMUNITY)

## 2024-03-26 DIAGNOSIS — R4182 Altered mental status, unspecified: Secondary | ICD-10-CM | POA: Diagnosis not present

## 2024-03-26 DIAGNOSIS — S065XAA Traumatic subdural hemorrhage with loss of consciousness status unknown, initial encounter: Secondary | ICD-10-CM | POA: Diagnosis not present

## 2024-03-26 DIAGNOSIS — R569 Unspecified convulsions: Secondary | ICD-10-CM | POA: Diagnosis not present

## 2024-03-26 MED ORDER — STROKE: EARLY STAGES OF RECOVERY BOOK
Freq: Once | Status: AC
  Filled 2024-03-26: qty 1

## 2024-03-26 MED ORDER — LEVETIRACETAM 500 MG PO TABS
500.0000 mg | ORAL_TABLET | Freq: Two times a day (BID) | ORAL | Status: DC
  Administered 2024-03-26 – 2024-03-27 (×3): 500 mg via ORAL
  Filled 2024-03-26 (×3): qty 1

## 2024-03-26 NOTE — Progress Notes (Signed)
 Transition of Care [facility]) - Inpatient Brief Assessment   Patient Details  Name: Edwin Dickerson MRN: [id number] Date of Birth: 07/03/1950  Transition of Care [facility]) CM/SW Contact:    Rosaline JONELLE Joe, RN Phone Number: 03/26/2024, 2:13 PM   Clinical Narrative: CM met with the patient and spouse at the bedside.  The patient admitted to the hospital for subarachnoid hemorrhage.  Patient currently has EEG in place.  Wife is present at the bedside.  Patient is active with [facility] therapy and plans to continue therapy services.  External order will be placed to continue outpatient therapy at [facility] - to be co-signed by MD.  DME at the home includes RW, 3:1 and shower seat.  Patient plans to discharge home by car with his wife when stable for discharge.   Transition of Care Asessment: Insurance and Status: (P) Insurance coverage has been reviewed Patient has primary care physician: (P) Yes Home environment has been reviewed: (P) from home with spouse Prior level of function:: (P) self - Prior/Current Home Services: (P) No current home services (DME at the home includes RW, 3:1, shower seat) Social Drivers of Health Review: (P) SDOH reviewed interventions complete Readmission risk has been reviewed: (P) Yes Transition of care needs: (P) transition of care needs identified, TOC will continue to follow

## 2024-03-26 NOTE — Plan of Care (Signed)

## 2024-03-26 NOTE — Procedures (Signed)
 Patient Name: Edwin Dickerson  MRN: [id number]  Epilepsy Attending: Arlin MALVA Krebs  Referring Physician/Provider: Judithe Rocky BROCKS, NP  Duration: 03/25/2024 1413 to 03/26/2024 1413  Patient history: 73 yo F with SAH/SDH, s/p left crani. Recent spells noted of inability to speak. EEG to evaluate for seizure   Level of alertness: Awake, asleep  AEDs during EEG study: LEV  Technical aspects: This EEG study was done with scalp electrodes positioned according to the 10-20 International system of electrode placement. Electrical activity was reviewed with band pass filter of 1-70Hz , sensitivity of 7 uV/mm, display speed of 26mm/sec with a 60Hz  notched filter applied as appropriate. EEG data were recorded continuously and digitally stored.  Video monitoring was available and reviewed as appropriate.  Description: The posterior dominant rhythm consists of 8 Hz activity of moderate voltage (25-35 uV) seen predominantly in posterior head regions, symmetric and reactive to eye opening and eye closing. Sleep was characterized by vertex waves, sleep spindles (12 to 14 Hz), maximal frontocentral region. EEG showed intermittent 3 to 6 Hz theta-delta slowing in left hemisphere. Sharp waves were noted in left temporo-parietal region. Hyperventilation and photic stimulation were not performed.     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 Additionally there is cortical dysfunction arising from  left hemisphere likely secondary to underlying structural abnormality. No seizures were seen throughout the recording.  [name]

## 2024-03-26 NOTE — Progress Notes (Signed)
 NEUROLOGY CONSULT FOLLOW UP NOTE   Date of service: March 26, 2024 Patient Name: Edwin Dickerson MRN:  [id number] DOB:  1950/07/27  Interval Hx/subjective   Seen in room with RN at the bedside.  Patient just had an episode of expressive aphasia and received some Ativan.  He is frustrated with the change in his speech.  He states that at baseline he does not have speech deficit    Vitals   Vitals:   03/25/24 1617 03/25/24 2027 03/26/24 0400 03/26/24 0542  BP: 125/80 106/74 (!) 93/50 123/65  Pulse: (!) 59 (!) 57 (!) 48 (!) 52  Resp: 20 17    Temp: 98.8 F (37.1 C) (!) 97.5 F (36.4 C) 97.9 F (36.6 C)   TempSrc:  Oral Axillary   SpO2: 99% 99% 96% 100%  Weight:      Height:         Body mass index is 29.32 kg/m.  Physical Exam   Constitutional: Appears well-developed and well-nourished.  Psych: easily agitated  Eyes: No scleral injection.  HENT: No OP obstrucion.  Head: Normocephalic.  Cardiovascular: Normal rate and regular rhythm.  Respiratory: Effort normal, non-labored breathing.  GI: Soft.  No distension. There is no tenderness.  Skin: WDI.   Neurologic Examination    Neuro: Mental Status: Patient is awake, alert, oriented to person, states 10th month.  Still having some expressive aphasia.  RN did give patient some Ativan just prior to exam due to aphasic episode.  He is able to identify common objects, but it does take him some time to find his words. Cranial Nerves: II: Visual Fields are full. Pupils are equal, round, and reactive to light.   III,IV, VI: EOMI without ptosis or diploplia.  V: Facial sensation is symmetric to temperature VII: Facial movement is symmetric resting and smiling VIII: Hearing is intact to voice X: Palate elevates symmetrically XI: Shoulder shrug is symmetric. XII: Tongue protrudes midline without atrophy or fasciculations.  Motor: Tone is normal. Bulk is normal. 5/5 strength was present in all four extremities.   Sensory: Sensation is symmetric to light touch and temperature in the arms and legs. No extinction to DSS present.  Cerebellar: FNF and HKS are intact bilaterally   Medications  Current Facility-Administered Medications:    acetaminophen  (TYLENOL ) tablet 650 mg, 650 mg, Oral, Q6H PRN **OR** acetaminophen  (TYLENOL ) suppository 650 mg, 650 mg, Rectal, Q6H PRN, Danford, Lonni SQUIBB, MD   atorvastatin  (LIPITOR) tablet 40 mg, 40 mg, Oral, q1800, Danford, Lonni SQUIBB, MD, 40 mg at 03/25/24 1908   finasteride  (PROSCAR ) tablet 5 mg, 5 mg, Oral, Daily, Danford, Lonni SQUIBB, MD, 5 mg at 03/25/24 0903   LORazepam (ATIVAN) tablet 0.5 mg, 0.5 mg, Oral, Once PRN, Danford, Lonni SQUIBB, MD   ondansetron  (ZOFRAN ) tablet 4 mg, 4 mg, Oral, Q6H PRN **OR** ondansetron  (ZOFRAN ) injection 4 mg, 4 mg, Intravenous, Q6H PRN, Jonel Lonni SQUIBB, MD  Labs and Diagnostic Imaging   CBC:  Recent Labs  Lab 03/23/24 1517 03/23/24 1525 03/24/24 0305  WBC 6.8  --  6.3  NEUTROABS 4.9  --   --   HGB 12.9* 12.6* 12.0*  HCT 38.5* 37.0* 34.9*  MCV 91.0  --  89.0  PLT 198  --  190    Basic Metabolic Panel:  Lab Results  Component Value Date   NA 137 03/24/2024   K 3.7 03/24/2024   CO2 24 03/24/2024   GLUCOSE 91 03/24/2024   BUN 12 03/24/2024   CREATININE  0.99 03/24/2024   CALCIUM  8.5 (L) 03/24/2024   GFRNONAA >60 03/24/2024   GFRAA >60 10/22/2016   Lipid Panel:  Lab Results  Component Value Date   LDLCALC 171 (H) 10/23/2016   HgbA1c:  Lab Results  Component Value Date   HGBA1C 5.0 02/13/2024   Urine Drug Screen:     Component Value Date/Time   LABOPIA NONE DETECTED 03/23/2024 1508   COCAINSCRNUR NONE DETECTED 03/23/2024 1508   LABBENZ NONE DETECTED 03/23/2024 1508   AMPHETMU NONE DETECTED 03/23/2024 1508   THCU NONE DETECTED 03/23/2024 1508   LABBARB NONE DETECTED 03/23/2024 1508    Alcohol Level     Component Value Date/Time   ETH <15 03/23/2024 1517   INR  Lab Results   Component Value Date   INR 1.0 03/23/2024   APTT  Lab Results  Component Value Date   APTT 27 03/23/2024   CT Head without contrast(Personally reviewed): Stable thin convexity subdural hematoma, more lobulated left parietal subdural collection and minimal subarachnoid hemorrhage Stable 2 mm midline shift   MRI Brain(Personally reviewed): Stable combined then left hemispheric and lobulated focal left parietal convexity subdural hematomas.   Small volume left hemisphere subarachnoid hemorrhage, stable. Stable regional mass effect with trace rightward midline shift No new intracranial abnormality   Neurodiagnostics rEEG: 10/25 Mild diffuse slowing indicative of global cerebral dysfunction.  Breach rhythm over left frontoparietal region consistent with postoperative skull defectrain(  cEEG: 03/25/2024 1413 to 03/26/2024 0920  This study showed evidence of epileptogenicity arising from left temporo-parietal region. Additionally there is cortical dysfunction arising from  left hemisphere likely secondary to underlying structural abnormality. No seizures were seen throughout the recording.   Assessment   Edwin Dickerson is a 73 y.o. male HLD, SDH status postcraniotomy and middle meningeal artery embolization September 2025, frontal/parietotemporal subarachnoid hemorrhage diagnosed after presenting 10/24 due to 2 days of worsening confusion and slurred speech.  Concern for an electrographic event as there is no acute abnormality on MRI.  In reviewing his chart it does appear he has had some episodes of expressive aphasia in 2018 as well and these were thought to be a TIA.  Recommendations  - Keppra  500mg  BID  - May consider switching to Depakote if agitation becomes an issue.  His liver enzymes are within normal limits - LTM in place-monitor for 1 more day We will continue to follow  ______________________________________________________________________   Signed, Jorene Last,  NP [facility]   Attending Neurohospitalist Addendum Patient seen and examined with APP/Resident. Agree with the history and physical as documented above. Agree with the plan as documented, which I helped formulate. I have independently reviewed the chart, obtained history, review of systems and examined the patient.I have personally reviewed pertinent head/neck/spine imaging (CT/MRI). Please feel free to call with any questions. Plan relayed to Dr. Briana  -- Eligio Lav, MD Neurologist [facility] Pager: [phone]

## 2024-03-26 NOTE — Progress Notes (Signed)
 PROGRESS NOTE    Edwin Dickerson  FMW:[id number] DOB: [birth year] DOA: 03/23/2024 PCP: Marelyn Axon, MD   Brief Narrative: Edwin Dickerson is a 73 y.o. 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  Patient presented secondary to worsening confusion and slurred speech, found to have a frontal and parietotemporal subarachnoid hemorrhage. Neurosurgery consulted. Concern for possible seizure, with Keppra  recommended by neurology.   Assessment and Plan:  Subarachnoid hemorrhage No recent head injury. Patient with confusion and slurred speech which has resolved. 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 Neurosurgery consulted. Repeat MRI significant for stable subarachnoid hemorrhage.  Slurred speech Aphasia Present on admission with aphasia which has now resolved. Presumed secondary to identified subarachnoid hematoma. EEG ordered and unremarkable for seizures. Patient with recurrent episodes during this hospitalization. Patient with episode of change in mental status on 10/26 involving dysarthria and aphasia, in addition to right sided weakness. Concern for worsening hemorrhage vs seizures. Code stroke called and CT head significant for stable hematoma/SAH. Neurology started patient on Keppra  load and continuous EEG ordered. -Neurology recommendations: Keppra , continuous EEG  History of subdural hematoma Patient is s/p craniotomy and middle meningeal artery embolization.  Hyperlipidemia -Continue Lipitor  Primary hypertension -Continue losartan   BPH -Continue finasteride    DVT prophylaxis: SCDs Code Status:   Code Status: Full Code Family Communication: Wife at bedside Disposition Plan: Discharge home pending specialist recommendations and  PT/OT evaluation   Consultants:  Neurosurgery  Procedures:  None  Antimicrobials: None    Subjective: Patient reports another episode of aphasia and speech difficulties that occurred this morning but appear to have resolved much quicker.  Objective: BP 116/68 (BP Location: Right Arm)   Pulse (!) 57   Temp 98.6 F (37 C)   Resp 20   Ht 5' 7 (1.702 m)   Wt 84.9 kg   SpO2 100%   BMI 29.32 kg/m   Examination:  General exam: Appears calm and comfortable. Respiratory system: Clear to auscultation. Respiratory effort normal. Cardiovascular system: S1 & S2 heard, RRR. Gastrointestinal system: Abdomen is nondistended, soft and nontender. Normal bowel sounds heard. Central nervous system: Alert and oriented.  Musculoskeletal: No edema. No calf tenderness   Data Reviewed: I have personally reviewed following labs and imaging studies  CBC Lab Results  Component Value Date   WBC 6.3 03/24/2024   RBC 3.92 (L) 03/24/2024   HGB 12.0 (L) 03/24/2024   HCT 34.9 (L) 03/24/2024   MCV 89.0 03/24/2024   MCH 30.6 03/24/2024   PLT 190 03/24/2024   MCHC 34.4 03/24/2024   RDW 13.1 03/24/2024   LYMPHSABS 1.2 03/23/2024   MONOABS 0.5 03/23/2024   EOSABS 0.1 03/23/2024   BASOSABS 0.1 03/23/2024     Last metabolic panel Lab Results  Component Value Date   NA 137 03/24/2024   K 3.7 03/24/2024   CL 104 03/24/2024   CO2 24 03/24/2024   BUN 12 03/24/2024   CREATININE 0.99 03/24/2024   GLUCOSE 91 03/24/2024   GFRNONAA >60 03/24/2024   GFRAA >60 10/22/2016   CALCIUM  8.5 (L) 03/24/2024   PHOS 3.3 02/03/2024   PROT 6.4 (L) 03/23/2024   ALBUMIN 3.6 03/23/2024   BILITOT 1.6 (H) 03/23/2024   ALKPHOS 73 03/23/2024   AST 21 03/23/2024   ALT 21 03/23/2024   ANIONGAP 9 03/24/2024    GFR: Estimated  Creatinine Clearance: 67.1 mL/min (by C-G formula based on SCr of 0.99 mg/dL).  No results found for this or any previous visit (from the past 240 hours).    Radiology  Studies: Overnight EEG with video Result Date: 03/26/2024 Dickerson Edwin KIDD, MD     03/26/2024  9:23 AM Patient Name: Edwin Dickerson MRN: [id number] Epilepsy Attending: Arlin KIDD Dickerson Referring Physician/Provider: Judithe Rocky BROCKS, Edwin Dickerson Duration: 03/25/2024 1413 to 03/26/2024 0920 Patient history: 73 yo F with SAH/SDH, s/p left crani. Recent spells noted of inability to speak. EEG to evaluate for seizure Level of alertness: Awake, asleep AEDs during EEG study: LEV Technical aspects: This EEG study was done with scalp electrodes positioned according to the 10-20 International system of electrode placement. Electrical activity was reviewed with band pass filter of 1-70Hz , sensitivity of 7 uV/mm, display speed of 60mm/sec with a 60Hz  notched filter applied as appropriate. EEG data were recorded continuously and digitally stored.  Video monitoring was available and reviewed as appropriate. Description: The posterior dominant rhythm consists of 8 Hz activity of moderate voltage (25-35 uV) seen predominantly in posterior head regions, symmetric and reactive to eye opening and eye closing. Sleep was characterized by vertex waves, sleep spindles (12 to 14 Hz), maximal frontocentral region. EEG showed intermittent 3 to 6 Hz theta-delta slowing in left hemisphere. Sharp waves were noted in left temporo-parietal region. Hyperventilation and photic stimulation were not performed.   ABNORMALITY - Sharp wave, left temporo-parietal region - Intermittent slow, left hemisphere IMPRESSION: This study showed evidence of epileptogenicity arising from left temporo-parietal region. Additionally there is cortical dysfunction arising from  left hemisphere likely secondary to underlying structural abnormality. No seizures were seen throughout the recording. Edwin Dickerson   CT HEAD CODE STROKE WO CONTRAST Result Date: 03/25/2024 EXAM: CT HEAD WITHOUT 03/25/2024 10:58:42 AM TECHNIQUE: CT of the head was performed without the  administration of intravenous contrast. Automated exposure control, iterative reconstruction, and/or weight based adjustment of the mA/kV was utilized to reduce the radiation dose to as low as reasonably achievable. COMPARISON: None available. CLINICAL HISTORY: Neuro deficit, acute, stroke suspected. FINDINGS: BRAIN AND VENTRICLES: The thin convexity subdural hematoma and more lobulated left parietal subdural collection are stable. Minimal subarachnoid blood is stable. 2 mm midline shift is similar to the prior exam. No evidence of acute infarct. No hydrocephalus. ORBITS: No acute abnormality. SINUSES AND MASTOIDS: No acute abnormality. SOFT TISSUES AND SKULL: No acute skull fracture. No acute soft tissue abnormality. alberta stroke program early CT score (aspects) ----- Ganglionic (caudate, ic, lentiform nucleus, insula, M1-m3): 7 Supraganglionic (m4-m6): 3 Total: 10 IMPRESSION: 1. Stable thin convexity subdural hematoma, more lobulated left parietal subdural collection, and minimal subarachnoid hemorrhage. 2. Stable 2 mm midline shift. 3. ASPECTS: 10. 4. These results were communicated to Dr. Vanessa at 11:04 AM on by secure text page via the [facility] messaging system. Electronically signed by: Lonni Necessary MD 03/25/2024 11:05 AM EDT RP Workstation: [id number]   EEG adult Result Date: 03/24/2024 Matthews Elida HERO, MD     03/25/2024  8:49 AM Routine EEG Report Edwin Dickerson is a 73 y.o. male with a history of altered mental status who is undergoing an EEG to evaluate for seizures. Report: This EEG was acquired with electrodes placed according to the International 10-20 electrode system (including Fp1, Fp2, F3, F4, C3, C4, P3, P4, O1, O2, T3, T4, T5, T6, A1, A2, Fz, Cz, Pz). The following electrodes were missing or displaced: none. The occipital dominant rhythm was 6-7  Hz. There is a breach rhythm located over the left frontoparietal region. This activity is reactive to stimulation. Drowsiness was  manifested by background fragmentation; deeper stages of sleep were identified by K complexes and sleep spindles. There was no focal slowing. There were no interictal epileptiform discharges. There were no electrographic seizures identified. Photic stimulation and hyperventilation were not performed. Impression and clinical correlation: This EEG was obtained while awake and asleep and is abnormal due to: - mild diffuse slowing indicative of global cerebral dysfunction - breach rhythm over the left frontoparietal region consistent with post-operative skull defect Epileptiform abnormalities were not seen during this recording. Elida Ross, MD [facility] [phone] If 7pm- 7am, please page neurology on call as listed in AMION.      LOS: 1 day   CRITICAL CARE Performed by: Elgin Lam, MD   Total critical care time: 40 minutes  Critical care time was exclusive of separately billable procedures and treating other patients.  Critical care was necessary to treat or prevent imminent or life-threatening deterioration.  Critical care was time spent personally by me on the following activities: development of treatment plan with patient and/or surrogate as well as nursing, discussions with consultants, evaluation of patient's response to treatment, examination of patient, obtaining history from patient or surrogate, ordering and performing treatments and interventions, ordering and review of laboratory studies, ordering and review of radiographic studies, pulse oximetry and re-evaluation of patient's condition.   Elgin Lam, MD [facility] 03/26/2024, 10:50 AM   If 7PM-7AM, please contact night-coverage [email or web address]

## 2024-03-26 NOTE — Progress Notes (Signed)
 LTM maint complete - no skin breakdown under:  Fp1,Fp2

## 2024-03-27 ENCOUNTER — Inpatient Hospital Stay (HOSPITAL_COMMUNITY)

## 2024-03-27 DIAGNOSIS — I609 Nontraumatic subarachnoid hemorrhage, unspecified: Secondary | ICD-10-CM | POA: Diagnosis not present

## 2024-03-27 DIAGNOSIS — R569 Unspecified convulsions: Secondary | ICD-10-CM | POA: Diagnosis not present

## 2024-03-27 DIAGNOSIS — G9389 Other specified disorders of brain: Secondary | ICD-10-CM

## 2024-03-27 MED ORDER — LEVETIRACETAM 500 MG PO TABS
750.0000 mg | ORAL_TABLET | Freq: Two times a day (BID) | ORAL | Status: DC
  Administered 2024-03-27 – 2024-03-28 (×2): 750 mg via ORAL
  Filled 2024-03-27 (×2): qty 1

## 2024-03-27 MED ORDER — SODIUM CHLORIDE 0.9 % IV BOLUS
500.0000 mL | Freq: Once | INTRAVENOUS | Status: AC
  Administered 2024-03-27: 500 mL via INTRAVENOUS

## 2024-03-27 MED ORDER — LEVETIRACETAM (KEPPRA) 500 MG/5 ML ADULT IV PUSH
1000.0000 mg | Freq: Once | INTRAVENOUS | Status: AC
  Administered 2024-03-27: 1000 mg via INTRAVENOUS
  Filled 2024-03-27: qty 10

## 2024-03-27 NOTE — Progress Notes (Signed)
 LTM maint complete - no skin breakdown under:  A2,01

## 2024-03-27 NOTE — Evaluation (Cosign Needed)
 Speech Language Pathology Evaluation Patient Details Name: Edwin Dickerson MRN: [id number] DOB: 1951-04-02 Today's Date: 03/27/2024 Time: 9094-9072 SLP Time Calculation (min) (ACUTE ONLY): 22 min  Problem List:  Patient Active Problem List   Diagnosis Date Noted   Seizure (HCC) 03/25/2024   Subarachnoid hemorrhage (HCC) 03/23/2024   Orthostatic hypotension 02/15/2024   AKI (acute kidney injury) 02/15/2024   Leukocytosis 02/15/2024   Hyponatremia 02/15/2024   Traumatic subdural hematoma (HCC) 02/06/2024   S/P craniotomy 02/01/2024   SDH (subdural hematoma) (HCC) 02/01/2024   Hypertension 02/01/2024   Hyperlipidemia 02/01/2024   Routine adult health maintenance 12/07/2016   Vision changes    Word finding difficulty    Elevated blood pressure reading    Benign prostatic hyperplasia with lower urinary tract symptoms    Chronic nonintractable headache    TIA (transient ischemic attack) 10/22/2016   Blurred vision, bilateral    Slurred speech    Transient cerebral ischemia    Past Medical History:  Past Medical History:  Diagnosis Date   High cholesterol    Hypertension    SDH (subdural hematoma) (HCC)    TIA (transient ischemic attack)    Past Surgical History:  Past Surgical History:  Procedure Laterality Date   CRANIOTOMY Left 02/01/2024   Procedure: CRANIOTOMY HEMATOMA EVACUATION SUBDURAL;  Surgeon: Joshua Alm Hamilton, MD;  Location: [facility] OR;  Service: Neurosurgery;  Laterality: Left;   IR ANGIO EXTERNAL CAROTID SEL EXT CAROTID UNI L MOD SED  02/17/2024   IR ANGIO INTRA EXTRACRAN SEL INTERNAL CAROTID UNI L MOD SED  02/22/2024   IR ANGIOGRAM FOLLOW UP STUDY  02/22/2024   IR ANGIOGRAM FOLLOW UP STUDY  02/17/2024   IR NEURO EACH ADD'L AFTER BASIC UNI LEFT (MS)  02/17/2024   IR TRANSCATH/EMBOLIZ  02/17/2024   PARS PLANA REPAIR OF RETINAL DEATACHMENT     RADIOLOGY WITH ANESTHESIA N/A 02/17/2024   Procedure: RADIOLOGY WITH ANESTHESIA;  Surgeon: Lanis Pupa, MD;  Location: [facility]  OR;  Service: Radiology;  Laterality: N/A;  EMBOLIZATION   HPI:  73 y.o. M with obesity, HLD, chronic orthostasis, and SDH 8 weeks ago s/p craniotomy on 02/01/24 by Dr. Joshua and middle meningeal artery embo on 9/19 who presented with worsening confusion and slurred speech for 2 days. 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  MRI showed new surrounding frontal, parietotemporal SAH.   Assessment / Plan / Recommendation Clinical Impression  Patient presents with expressive and receptive language function that appears [facility]. SLP conducted bedside WAB-R to assess expressive/receptive language abilties where no deficits occured in answering yes/no questions, following two and multistep commands, and naming objects. Patients ability to repeat appears WFL at word and phrase level however difficulty present as utterance length increases. SLP conducted an oral mechanism exam to assess structures are functioning properly for speech and swallowing. Oral motor structures appear symmetrical with appropriate ROM. Patient expressed altered speech during neurological episodes though speech/language restores once episodes resolve. Patient previously discharged from [facility] with lingering deficits in the areas of word finding and memory  on 9/15, however wife and bedside reports patient's outpatient SLP discharged patient from caseload d/t return to baseline level of function. No further speech therapy services are indicated. (Simultaneous filing. User may not have seen previous data.)    SLP Assessment  SLP Recommendation/Assessment: Patient does not need any further Speech Language Pathology Services SLP Visit Diagnosis: Aphasia (R47.01)     Assistance Recommended at Discharge  PRN  Functional Status Assessment Patient  has not had a recent decline in their functional status  Frequency and Duration           SLP Evaluation Cognition  Overall Cognitive Status: History of cognitive impairments - at  baseline Arousal/Alertness: Awake/alert Attention: Sustained Sustained Attention: Appears intact Safety/Judgment: Appears intact       Comprehension  Auditory Comprehension Overall Auditory Comprehension: Appears within functional limits for tasks assessed Yes/No Questions: Within Functional Limits Commands: Within Functional Limits Conversation: Complex EffectiveTechniques: Slowed speech;Increased volume Visual Recognition/Discrimination Discrimination: Within Function Limits    Expression Expression Primary Mode of Expression: Verbal Verbal Expression Overall Verbal Expression: Appears within functional limits for tasks assessed Initiation: No impairment Level of Generative/Spontaneous Verbalization: Conversation Repetition: Impaired Level of Impairment: Sentence level Naming: No impairment Pragmatics: No impairment Non-Verbal Means of Communication: Not applicable Written Expression Dominant Hand: Right   Oral / Motor  Oral Motor/Sensory Function Overall Oral Motor/Sensory Function: Within functional limits Motor Speech Overall Motor Speech: Appears within functional limits for tasks assessed Respiration: Within functional limits Phonation: Normal Resonance: Within functional limits Articulation: Within functional limitis Intelligibility: Intelligible Motor Planning: Within functional limits Motor Speech Errors: Not applicable            Tametra Ahart Kwakye-Ndlovu 03/27/2024, 3:43 PM

## 2024-03-27 NOTE — Progress Notes (Signed)
 LTM VIDEO EEG discontinued - no skin breakdown at [facility].

## 2024-03-27 NOTE — Progress Notes (Signed)
 PROGRESS NOTE    Edwin Dickerson  FMW:[id number] DOB: January 02, 1951 DOA: 03/23/2024 PCP: Marelyn Axon, MD   Brief Narrative: Edwin Dickerson is a 73 y.o. 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  Patient presented secondary to worsening confusion and slurred speech, found to have a frontal and parietotemporal subarachnoid hemorrhage. Neurosurgery consulted. Concern for possible seizure, with Keppra  recommended by neurology.   Assessment and Plan:  Subarachnoid hemorrhage No recent head injury. Patient with confusion and slurred speech which has resolved. 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 Neurosurgery consulted. Repeat MRI significant for stable subarachnoid hemorrhage.  Slurred speech Aphasia Present on admission with aphasia which has now resolved. Presumed secondary to identified subarachnoid hematoma. EEG ordered and unremarkable for seizures. Patient with recurrent episodes during this hospitalization. Patient with episode of change in mental status on 10/26 involving dysarthria and aphasia, in addition to right sided weakness. Concern for worsening hemorrhage vs seizures. Code stroke called and CT head significant for stable hematoma/SAH. Neurology started patient on Keppra  load and continuous EEG ordered. -Neurology recommendations: increasing Keppra , continuous EEG  History of subdural hematoma Patient is s/p craniotomy and middle meningeal artery embolization.  Hyperlipidemia -Continue Lipitor  Primary hypertension -Hold losartan   Orthostatic hypotension Unclear etiology. -NS bolus -Orthostatic vitals post IV fluids and in AM  BPH -Continue finasteride    DVT prophylaxis: SCDs Code Status:   Code Status: Full  Code Family Communication: Wife at bedside Disposition Plan: Discharge home pending specialist recommendations   Consultants:  Neurosurgery  Procedures:  None  Antimicrobials: None    Subjective: Patient continues to have episodes while on EEG. Hoping to discharge home soon.  Objective: BP 114/70 (BP Location: Right Arm)   Pulse 60   Temp 98.1 F (36.7 C) (Oral)   Resp 18   Ht 5' 7 (1.702 m)   Wt 84.9 kg   SpO2 97%   BMI 29.32 kg/m   Examination:  General exam: Appears calm and comfortable. Respiratory system: Clear to auscultation. Respiratory effort normal. Cardiovascular system: S1 & S2 heard, RRR. No murmur. Gastrointestinal system: Abdomen is nondistended, soft and nontender. Normal bowel sounds heard. Central nervous system: Alert and oriented.   Data Reviewed: I have personally reviewed following labs and imaging studies  CBC Lab Results  Component Value Date   WBC 6.3 03/24/2024   RBC 3.92 (L) 03/24/2024   HGB 12.0 (L) 03/24/2024   HCT 34.9 (L) 03/24/2024   MCV 89.0 03/24/2024   MCH 30.6 03/24/2024   PLT 190 03/24/2024   MCHC 34.4 03/24/2024   RDW 13.1 03/24/2024   LYMPHSABS 1.2 03/23/2024   MONOABS 0.5 03/23/2024   EOSABS 0.1 03/23/2024   BASOSABS 0.1 03/23/2024     Last metabolic panel Lab Results  Component Value Date   NA 137 03/24/2024   K 3.7 03/24/2024   CL 104 03/24/2024   CO2 24 03/24/2024   BUN 12 03/24/2024   CREATININE 0.99 03/24/2024   GLUCOSE 91 03/24/2024   GFRNONAA >60 03/24/2024   GFRAA >60 10/22/2016   CALCIUM  8.5 (L) 03/24/2024   PHOS 3.3 02/03/2024   PROT 6.4 (L) 03/23/2024   ALBUMIN 3.6 03/23/2024   BILITOT 1.6 (H) 03/23/2024   ALKPHOS 73 03/23/2024   AST 21 03/23/2024   ALT 21 03/23/2024   ANIONGAP 9 03/24/2024    GFR:  Estimated Creatinine Clearance: 67.1 mL/min (by C-G formula based on SCr of 0.99 mg/dL).  No results found for this or any previous visit (from the past 240 hours).    Radiology  Studies: Overnight EEG with video Result Date: 03/26/2024 Shelton Arlin KIDD, MD     03/26/2024  9:23 AM Patient Name: Edwin Dickerson MRN: [id number] Epilepsy Attending: Arlin KIDD Shelton Referring Physician/Provider: Judithe Rocky BROCKS, NP Duration: 03/25/2024 1413 to 03/26/2024 0920 Patient history: 73 yo F with SAH/SDH, s/p left crani. Recent spells noted of inability to speak. EEG to evaluate for seizure Level of alertness: Awake, asleep AEDs during EEG study: LEV Technical aspects: This EEG study was done with scalp electrodes positioned according to the 10-20 International system of electrode placement. Electrical activity was reviewed with band pass filter of 1-70Hz , sensitivity of 7 uV/mm, display speed of 69mm/sec with a 60Hz  notched filter applied as appropriate. EEG data were recorded continuously and digitally stored.  Video monitoring was available and reviewed as appropriate. Description: The posterior dominant rhythm consists of 8 Hz activity of moderate voltage (25-35 uV) seen predominantly in posterior head regions, symmetric and reactive to eye opening and eye closing. Sleep was characterized by vertex waves, sleep spindles (12 to 14 Hz), maximal frontocentral region. EEG showed intermittent 3 to 6 Hz theta-delta slowing in left hemisphere. Sharp waves were noted in left temporo-parietal region. Hyperventilation and photic stimulation were not performed.   ABNORMALITY - Sharp wave, left temporo-parietal region - Intermittent slow, left hemisphere IMPRESSION: This study showed evidence of epileptogenicity arising from left temporo-parietal region. Additionally there is cortical dysfunction arising from  left hemisphere likely secondary to underlying structural abnormality. No seizures were seen throughout the recording. Arlin KIDD Shelton   CT HEAD CODE STROKE WO CONTRAST Result Date: 03/25/2024 EXAM: CT HEAD WITHOUT 03/25/2024 10:58:42 AM TECHNIQUE: CT of the head was performed without the  administration of intravenous contrast. Automated exposure control, iterative reconstruction, and/or weight based adjustment of the mA/kV was utilized to reduce the radiation dose to as low as reasonably achievable. COMPARISON: None available. CLINICAL HISTORY: Neuro deficit, acute, stroke suspected. FINDINGS: BRAIN AND VENTRICLES: The thin convexity subdural hematoma and more lobulated left parietal subdural collection are stable. Minimal subarachnoid blood is stable. 2 mm midline shift is similar to the prior exam. No evidence of acute infarct. No hydrocephalus. ORBITS: No acute abnormality. SINUSES AND MASTOIDS: No acute abnormality. SOFT TISSUES AND SKULL: No acute skull fracture. No acute soft tissue abnormality. alberta stroke program early CT score (aspects) ----- Ganglionic (caudate, ic, lentiform nucleus, insula, M1-m3): 7 Supraganglionic (m4-m6): 3 Total: 10 IMPRESSION: 1. Stable thin convexity subdural hematoma, more lobulated left parietal subdural collection, and minimal subarachnoid hemorrhage. 2. Stable 2 mm midline shift. 3. ASPECTS: 10. 4. These results were communicated to Dr. Vanessa at 11:04 AM on by secure text page via the [facility] messaging system. Electronically signed by: Lonni Necessary MD 03/25/2024 11:05 AM EDT RP Workstation: [id number]      LOS: 2 days   CRITICAL CARE Performed by: Elgin Lam, MD   Total critical care time: 40 minutes  Critical care time was exclusive of separately billable procedures and treating other patients.  Critical care was necessary to treat or prevent imminent or life-threatening deterioration.  Critical care was time spent personally by me on the following activities: development of treatment plan with patient and/or surrogate as well as nursing, discussions with consultants, evaluation of patient's response to treatment, examination of patient, obtaining history from patient  or surrogate, ordering and performing treatments and  interventions, ordering and review of laboratory studies, ordering and review of radiographic studies, pulse oximetry and re-evaluation of patient's condition.   Elgin Lam, MD [facility] 03/27/2024, 7:49 AM   If 7PM-7AM, please contact night-coverage [email or web address]

## 2024-03-27 NOTE — Plan of Care (Signed)

## 2024-03-27 NOTE — Progress Notes (Signed)
 NEUROLOGY CONSULT FOLLOW UP NOTE   Date of service: March 27, 2024 Patient Name: Edwin Dickerson MRN:  [id number] DOB:  1951/05/07  Interval Hx/subjective   Reporting some dizziness, orthostatic vitals One time dose of keppra  1000mg  and increase BID to 750mg . Discussed with patient and wife at the bedside.   Vitals   Vitals:   03/26/24 1153 03/26/24 2019 03/26/24 2342 03/27/24 0358  BP: 100/64 96/67 94/60  114/70  Pulse: 65 60 64 60  Resp: 18 17 18 18   Temp: 98 F (36.7 C) 97.8 F (36.6 C) 97.7 F (36.5 C) 98.1 F (36.7 C)  TempSrc: Oral Oral  Oral  SpO2: 94% 98% 97% 97%  Weight:      Height:         Body mass index is 29.32 kg/m.  Physical Exam   Constitutional: Appears well-developed and well-nourished.  Psych: easily agitated  Eyes: No scleral injection.  HENT: No OP obstrucion.  Head: Normocephalic.  Cardiovascular: Normal rate and regular rhythm.  Respiratory: Effort normal, non-labored breathing.  GI: Soft.  No distension. There is no tenderness.  Skin: WDI.   Neurologic Examination    Neuro: Mental Status: Patient is awake, Aox4. Edwin Dickerson is fluent this afternoon. Able to identify objects. Repetition intact.  Cranial Nerves: II: Visual Fields are full. Pupils are equal, round, and reactive to light.   III,IV, VI: EOMI without ptosis or diploplia.  V: Facial sensation is symmetric to temperature VII: Facial movement is symmetric resting and smiling VIII: Hearing is intact to voice X: Palate elevates symmetrically XI: Shoulder shrug is symmetric. XII: Tongue protrudes midline without atrophy or fasciculations.  Motor: Tone is normal. Bulk is normal. 5/5 strength was present in all four extremities.  Sensory: Sensation is symmetric to light touch and temperature in the arms and legs. No extinction to DSS present.  Cerebellar: FNF and HKS are intact bilaterally   Medications  Current Facility-Administered Medications:    acetaminophen   (TYLENOL ) tablet 650 mg, 650 mg, Oral, Q6H PRN **OR** acetaminophen  (TYLENOL ) suppository 650 mg, 650 mg, Rectal, Q6H PRN, Edwin Dickerson, Edwin SQUIBB, Edwin Dickerson   atorvastatin  (LIPITOR) tablet 40 mg, 40 mg, Oral, q1800, Edwin Dickerson, Edwin SQUIBB, Edwin Dickerson, 40 mg at 03/26/24 1724   finasteride  (PROSCAR ) tablet 5 mg, 5 mg, Oral, Daily, Edwin Dickerson, Edwin SQUIBB, Edwin Dickerson, 5 mg at 03/27/24 9175   levETIRAcetam  (KEPPRA ) tablet 500 mg, 500 mg, Oral, BID, Edwin Silverio, Edwin Dickerson, 500 mg at 03/27/24 9175   ondansetron  (ZOFRAN ) tablet 4 mg, 4 mg, Oral, Q6H PRN, 4 mg at 03/26/24 2318 **OR** ondansetron  (ZOFRAN ) injection 4 mg, 4 mg, Intravenous, Q6H PRN, Edwin Dickerson, Edwin SQUIBB, Edwin Dickerson   sodium chloride  0.9 % bolus 500 mL, 500 mL, Intravenous, Once, Edwin Dickerson LABOR, Edwin Dickerson  Labs and Diagnostic Imaging   CBC:  Recent Labs  Lab 03/23/24 1517 03/23/24 1525 03/24/24 0305  WBC 6.8  --  6.3  NEUTROABS 4.9  --   --   HGB 12.9* 12.6* 12.0*  HCT 38.5* 37.0* 34.9*  MCV 91.0  --  89.0  PLT 198  --  190    Basic Metabolic Panel:  Lab Results  Component Value Date   NA 137 03/24/2024   K 3.7 03/24/2024   CO2 24 03/24/2024   GLUCOSE 91 03/24/2024   BUN 12 03/24/2024   CREATININE 0.99 03/24/2024   CALCIUM  8.5 (L) 03/24/2024   GFRNONAA >60 03/24/2024   GFRAA >60 10/22/2016   Lipid Panel:  Lab Results  Component Value Date   LDLCALC  171 (H) 10/23/2016   HgbA1c:  Lab Results  Component Value Date   HGBA1C 5.0 02/13/2024   Urine Drug Screen:     Component Value Date/Time   LABOPIA NONE DETECTED 03/23/2024 1508   COCAINSCRNUR NONE DETECTED 03/23/2024 1508   LABBENZ NONE DETECTED 03/23/2024 1508   AMPHETMU NONE DETECTED 03/23/2024 1508   THCU NONE DETECTED 03/23/2024 1508   LABBARB NONE DETECTED 03/23/2024 1508    Alcohol Level     Component Value Date/Time   ETH <15 03/23/2024 1517   INR  Lab Results  Component Value Date   INR 1.0 03/23/2024   APTT  Lab Results  Component Value Date   APTT 27 03/23/2024   CT Head  without contrast(Personally reviewed): Stable thin convexity subdural hematoma, more lobulated left parietal subdural collection and minimal subarachnoid hemorrhage Stable 2 mm midline shift   MRI Brain(Personally reviewed): Stable combined then left hemispheric and lobulated focal left parietal convexity subdural hematomas.   Small volume left hemisphere subarachnoid hemorrhage, stable. Stable regional mass effect with trace rightward midline shift No new intracranial abnormality   Neurodiagnostics rEEG: 10/25 Mild diffuse slowing indicative of global cerebral dysfunction.  Breach rhythm over left frontoparietal region consistent with postoperative skull defectrain(  cEEG: 03/25/2024 1413 to 03/26/2024 0920  This study showed evidence of epileptogenicity arising from left temporo-parietal region. Additionally there is cortical dysfunction arising from  left hemisphere likely secondary to underlying structural abnormality. No seizures were seen throughout the recording.   cEEG 03/26/2024 1413 to 03/27/2024 1000  This study showed evidence of epileptogenicity arising from left temporo-parietal region. Additionally there is cortical dysfunction arising from  left hemisphere likely secondary to underlying structural abnormality. No definite seizures were seen throughout the recording.   Event button was pressed 5 times within 2216 to 2247 for trouble speaking without concomitant EEG change.  However, there are sharp waves as well as slowing in the left temporoparietal region.  And focal seizures with localization from deeper brain structures may not always show up on scalp EEG.  Therefore clinical correlation is recommended.  Assessment   Edwin Dickerson is a 73 y.o. male HLD, SDH status postcraniotomy and middle meningeal artery embolization September 2025, frontal/parietotemporal subarachnoid hemorrhage diagnosed after presenting 10/24 due to 2 days of worsening confusion and slurred speech.   Concern for an electrographic event as there is no acute abnormality on MRI.  In reviewing his chart it does appear he has had some episodes of expressive aphasia in 2018 as well and these were thought to be a TIA. Multiple pushbutton events overnight for the aphasia episodes.  No clear seizures but he does have left-sided sharp waves so the episodes are likely electrographic cerebral dysfunction akin to seizures.    Recommendations  - Increase the dose of antiepileptic-Keppra  750mg  BID  - Additional Keppra  1000mg  IV now -Monitor for 1 more day.  If he remains stable, should be able to be discharged tomorrow from a neurological standpoint. - Discontinue LTM - Outpatient neuro follow up   plan relayed to Dr. Briana  ______________________________________________________________________   Signed, Jorene Last, NP [facility]     Attending Neurohospitalist Addendum Patient seen and examined with APP/Resident. Agree with the history and physical as documented above. Agree with the plan as documented, which I helped formulate. I have independently reviewed the chart, obtained history, review of systems and examined the patient.I have personally reviewed pertinent head/neck/spine imaging (CT/MRI). Please feel free to call with any questions.  -- Eligio  Voncile, Edwin Dickerson Neurologist [facility] Pager: [phone]

## 2024-03-27 NOTE — Progress Notes (Signed)
~  2230, pt called RN into room, noticed slight challenge with words/sentences. Pt wanted help with locating phone to call wife. Assistance given. Call from EEG saying pt pressed button multiple times. 2245: walked into room, spoke with wife, pt noted with slurred speech and difficulty talking. Wife notes these are his 'spells' he gets. Informed EEG who state they will notify neurologist. Secure chat sent to hospitalist. This activity lasted 1 hr and 45 mins before noticing improvement with speech and sentences.  0107: Neurologist called phone after page sent, states no changes needed to med list

## 2024-03-27 NOTE — Procedures (Addendum)
 Patient Name: Edwin Dickerson  MRN: [id number]  Epilepsy Attending: Arlin MALVA Krebs  Referring Physician/Provider: Judithe Rocky BROCKS, NP  Duration: 03/26/2024 1413 to 03/27/2024 1058   Patient history: 73 yo F with SAH/SDH, s/p left crani. Recent spells noted of inability to speak. EEG to evaluate for seizure    Level of alertness: Awake, asleep   AEDs during EEG study: LEV   Technical aspects: This EEG study was done with scalp electrodes positioned according to the 10-20 International system of electrode placement. Electrical activity was reviewed with band pass filter of 1-70Hz , sensitivity of 7 uV/mm, display speed of 38mm/sec with a 60Hz  notched filter applied as appropriate. EEG data were recorded continuously and digitally stored.  Video monitoring was available and reviewed as appropriate.   Description: The posterior dominant rhythm consists of 8 Hz activity of moderate voltage (25-35 uV) seen predominantly in posterior head regions, symmetric and reactive to eye opening and eye closing. Sleep was characterized by vertex waves, sleep spindles (12 to 14 Hz), maximal frontocentral region. EEG showed intermittent 3 to 6 Hz theta-delta slowing in left hemisphere. Sharp waves were noted in left temporo-parietal region. Hyperventilation and photic stimulation were not performed.     Event button was pressed 5 times within 2216 to 2247.  Reportedly patient had trouble speaking.  Concomitant EEG before, during and after the event did not show any definitive EEG to suggest seizure.   ABNORMALITY - Sharp wave, left temporo-parietal region - Intermittent slow, left hemisphere   IMPRESSION: This study showed evidence of epileptogenicity arising from left temporo-parietal region. Additionally there is cortical dysfunction arising from  left hemisphere likely secondary to underlying structural abnormality. No definite seizures were seen throughout the recording.  Event button was pressed 5 times  within 2216 to 2247 for trouble speaking without concomitant EEG change.  However, there are sharp waves as well as slowing in the left temporoparietal region.  And focal seizures with localization from deeper brain structures may not always show up on scalp EEG.  Therefore clinical correlation is recommended.  Dr Voncile was notified   Lyniah Fujita O Shakeitha Umbaugh

## 2024-03-27 NOTE — TOC CAGE-AID Note (Signed)
 Transition of Care [facility]) - CAGE-AID Screening   Patient Details  Name: Edwin Dickerson MRN: [id number] Date of Birth: [birth year]  [facility] CM/SW Contact:    Anaeli Cornwall E Teriah Muela, LCSW Phone Number: 03/27/2024, 11:18 AM   Clinical Narrative: No SA noted.   CAGE-AID Screening:    Have You Ever Felt You Ought to Cut Down on Your Drinking or Drug Use?: No Have People Annoyed You By Critizing Your Drinking Or Drug Use?: No Have You Felt Bad Or Guilty About Your Drinking Or Drug Use?: No Have You Ever Had a Drink or Used Drugs First Thing In The Morning to Steady Your Nerves or to Get Rid of a Hangover?: No CAGE-AID Score: 0  Substance Abuse Education Offered: No

## 2024-03-27 NOTE — Progress Notes (Signed)
   03/27/24 0942  Mobility  Activity Stood at bedside  Level of Assistance Standby assist, set-up cues, supervision of patient - no hands on  Assistive Device Front wheel walker  Activity Response Tolerated fair  Mobility Referral Yes  Mobility visit 1 Mobility  Mobility Specialist Start Time (ACUTE ONLY) [id number]  Mobility Specialist Stop Time (ACUTE ONLY) 1000  Mobility Specialist Time Calculation (min) (ACUTE ONLY) 18 min   Mobility Specialist: Progress Note  Pre-Mobility:      HR 69,      BP (Lying) 101/60 (73) During Mobility: HR 72-77, BP (EOB) 103/60  (75)                                            BP (Stand) 80/65 (71) Post-Mobility:    HR 67,      BP (Lying) 120/79 (92)  Pt agreeable to mobility session - received in bed. C/o wooziness before,during and after mobility. Returned to bed with all needs met - call bell within reach. Wife present.   Additional comments:  Orthostatics taken above.   Virgle Boards, BS Mobility Specialist Please contact via SecureChat or  Rehab office at [phone].

## 2024-03-28 DIAGNOSIS — I609 Nontraumatic subarachnoid hemorrhage, unspecified: Secondary | ICD-10-CM | POA: Diagnosis not present

## 2024-03-28 MED ORDER — SODIUM CHLORIDE 0.9 % IV SOLN: INTRAVENOUS | Status: DC

## 2024-03-28 MED ORDER — LEVETIRACETAM 750 MG PO TABS: 750.0000 mg | ORAL_TABLET | Freq: Two times a day (BID) | ORAL | 1 refills | Status: DC

## 2024-03-28 NOTE — Plan of Care (Signed)
 Patient seen and examined this morning briefly. He was getting his orthostatic vitals. Reports no recurrence of aphasia symptoms now for greater than 24 hours I would continue Keppra  750 twice daily for now. Management of orthostatic hypotension per primary team Outpatient follow-up with neurology-referral to [facility] neurology has been placed. Maintain seizure precautions Inpatient neurology will be available as needed. Plan relayed to Dr. Rojelio  -- Eligio Lav, MD Neurologist [facility]  No charge note

## 2024-03-28 NOTE — Discharge Summary (Signed)
 Physician Discharge Summary  Edwin Dickerson FMW:[id number] DOB: 02-19-1951 DOA: 03/23/2024  PCP: Marelyn Axon, MD  Admit date: 03/23/2024 Discharge date: 03/28/2024  Admitted From: Home Disposition:  Home  Recommendations for Outpatient Follow-up:  Follow up with PCP  Follow up with Urology as scheduled tomorrow. Instructed to hold proscar  until follow up.  Follow-up with neurosurgery Dr. Dorn Ned as outpatient regarding [facility]  Follow-up with neurology in 2 months, outpatient referral placed  Discharge Condition: Stable CODE STATUS: Full  Diet recommendation: Regular   Brief/Interim Summary: Edwin Dickerson is a 73 y.o. 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  Patient presented secondary to worsening confusion and slurred speech, found to have a frontal and parietotemporal subarachnoid hemorrhage. Neurosurgery consulted. Concern for possible seizure, with Keppra  recommended by neurology.  Discharge Diagnoses:   Principal Problem:   Subarachnoid hemorrhage (HCC) Active Problems:   SDH (subdural hematoma) (HCC)   Hypertension   Hyperlipidemia   Seizure (HCC)   Subarachnoid hemorrhage No recent head injury. Patient with confusion and slurred speech which has resolved. 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 Neurosurgery consulted. Repeat MRI significant for stable subarachnoid hemorrhage. Follow-up with neurosurgery as outpatient   Slurred speech Aphasia Present on admission with aphasia which has now resolved. Presumed secondary to identified subarachnoid hematoma. EEG ordered and unremarkable for seizures. Patient with recurrent episodes during this hospitalization. Patient with episode of change in mental status on 10/26  involving dysarthria and aphasia, in addition to right sided weakness. Concern for worsening hemorrhage vs seizures. Code stroke called and CT head significant for stable hematoma/SAH. Neurology started patient on Keppra  load and continuous EEG ordered. No recurrence of aphasia.  Continue Keppra .  Seizure precaution.  Discussed no driving for 6 months.  Per [place]  DMV statutes, patients with seizures are not allowed to drive until they have been seizure-free for six months. Use caution when using heavy equipment or power tools. Avoid working on ladders or at heights. Take showers instead of baths. Ensure the water temperature is not too high on the home water heater. Do not go swimming alone. When caring for infants or small children, sit down when holding, feeding, or changing them to minimize risk of injury to the child in the event you have a seizure. Also, Maintain good sleep hygiene. Avoid alcohol.  If patient has another seizure, call 911 and bring them back to the ED if: A.  The seizure lasts longer than 5 minutes.      B.  The patient doesn't wake shortly after the seizure or has new problems such as difficulty seeing, speaking or moving following the seizure C.  The patient was injured during the seizure D.  The patient has a temperature over 102 F (39C) E.  The patient vomited during the seizure and now is having trouble breathing   History of subdural hematoma Patient is s/p craniotomy and middle meningeal artery embolization.   Hyperlipidemia Continue Lipitor   Primary hypertension Hold losartan    Orthostatic hypotension Unclear etiology.  Patient asymptomatic, and ambulating in the room.  Repeat blood pressure with ambulation had improved to 101/67 which is his baseline.  Instructed to hold finasteride    BPH Follow up outpatient urology    Discharge Instructions  Discharge Instructions     Ambulatory referral to Neurology   Complete by: As  directed    An  appointment is requested in approximately: 8 weeks Aphasia, on keppra    Ambulatory referral to Physical Therapy   Complete by: As directed    Iontophoresis - 4 mg/ml of dexamethasone : No   T.E.N.S. Unit Evaluation and Dispense as Indicated: No   Call MD for:  difficulty breathing, headache or visual disturbances   Complete by: As directed    Call MD for:  extreme fatigue   Complete by: As directed    Call MD for:  persistant dizziness or light-headedness   Complete by: As directed    Call MD for:  persistant nausea and vomiting   Complete by: As directed    Call MD for:  severe uncontrolled pain   Complete by: As directed    Call MD for:  temperature >100.4   Complete by: As directed    Discharge instructions   Complete by: As directed    You were cared for by a hospitalist during your hospital stay. If you have any questions about your discharge medications or the care you received while you were in the hospital after you are discharged, you can call the unit and ask to speak with the hospitalist on call if the hospitalist that took care of you is not available. Once you are discharged, your primary care physician will handle any further medical issues. Please note that NO REFILLS for any discharge medications will be authorized once you are discharged, as it is imperative that you return to your primary care physician (or establish a relationship with a primary care physician if you do not have one) for your aftercare needs so that they can reassess your need for medications and monitor your lab values.   Increase activity slowly   Complete by: As directed       Allergies as of 03/28/2024       Reactions   Penicillins Rash        Medication List     PAUSE taking these medications    finasteride  5 MG tablet Wait to take this until your doctor or other care provider tells you to start again. Commonly known as: PROSCAR  Take 1 tablet (5 mg total) by mouth daily.        STOP taking these medications    Mag-G 500 (27 Mg) MG Tabs tablet Generic drug: magnesium  gluconate   scopolamine  1 MG/3DAYS Commonly known as: TRANSDERM-SCOP   vitamin D3 25 MCG tablet Commonly known as: CHOLECALCIFEROL        TAKE these medications    acetaminophen  325 MG tablet Commonly known as: TYLENOL  Take 2 tablets (650 mg total) by mouth every 4 (four) hours as needed for mild pain (pain score 1-3) (temp > 100.5).   atorvastatin  40 MG tablet Commonly known as: LIPITOR Take 1 tablet (40 mg total) by mouth daily at 6 PM. What changed: when to take this   B-complex with vitamin C tablet Take 1 tablet by mouth daily.   cetirizine 10 MG tablet Commonly known as: ZYRTEC Take 10 mg by mouth daily as needed for allergies.   clonazePAM  0.5 MG tablet Commonly known as: KLONOPIN  Take 1 tablet (0.5 mg total) by mouth at bedtime.   docusate sodium  100 MG capsule Commonly known as: COLACE Take 1 capsule (100 mg total) by mouth daily. What changed:  when to take this reasons to take this   levETIRAcetam  750 MG tablet Commonly known as: KEPPRA  Take 1 tablet (750 mg total) by mouth 2 (  two) times daily.        Follow-up Information     Inc, [facility] Follow up.   Why: Please continue Outpatient therapy at [facility]. Contact information: [address] [place] [address] [phone]          [facility]. Schedule an appointment as soon as possible for a visit.   Specialty: Neurology Why: CALL OFFICE FOR APPT IN 2 MONTHS AT DISCHARGE Contact information: [address] [place]  [address] [phone]        Marelyn Axon, MD Follow up.   Specialty: Family Medicine Contact information: [address] [place] [address] [phone]         Debby Dorn MATSU, MD. Call.   Specialty: Neurosurgery Why: Make an appointment for follow up regarding  subarachnoid hemorrhage Contact information: [address] [place] [address] [phone]                Allergies  Allergen Reactions   Penicillins Rash     Procedures/Studies: Overnight EEG with video Result Date: 03/26/2024 Shelton Arlin KIDD, MD     03/27/2024  9:49 AM Patient Name: Edwin Dickerson MRN: [id number] Epilepsy Attending: Arlin KIDD Shelton Referring Physician/Provider: Judithe Rocky BROCKS, NP Duration: 03/25/2024 1413 to 03/26/2024 1413 Patient history: 73 yo F with SAH/SDH, s/p left crani. Recent spells noted of inability to speak. EEG to evaluate for seizure Level of alertness: Awake, asleep AEDs during EEG study: LEV Technical aspects: This EEG study was done with scalp electrodes positioned according to the 10-20 International system of electrode placement. Electrical activity was reviewed with band pass filter of 1-70Hz , sensitivity of 7 uV/mm, display speed of 22mm/sec with a 60Hz  notched filter applied as appropriate. EEG data were recorded continuously and digitally stored.  Video monitoring was available and reviewed as appropriate. Description: The posterior dominant rhythm consists of 8 Hz activity of moderate voltage (25-35 uV) seen predominantly in posterior head regions, symmetric and reactive to eye opening and eye closing. Sleep was characterized by vertex waves, sleep spindles (12 to 14 Hz), maximal frontocentral region. EEG showed intermittent 3 to 6 Hz theta-delta slowing in left hemisphere. Sharp waves were noted in left temporo-parietal region. Hyperventilation and photic stimulation were not performed.   ABNORMALITY - Sharp wave, left temporo-parietal region - Intermittent slow, left hemisphere IMPRESSION: This study showed evidence of epileptogenicity arising from left temporo-parietal region. Additionally there is cortical dysfunction arising from  left hemisphere likely secondary to underlying structural abnormality. No seizures were seen  throughout the recording. Arlin KIDD Shelton   CT HEAD CODE STROKE WO CONTRAST Result Date: 03/25/2024 EXAM: CT HEAD WITHOUT 03/25/2024 10:58:42 AM TECHNIQUE: CT of the head was performed without the administration of intravenous contrast. Automated exposure control, iterative reconstruction, and/or weight based adjustment of the mA/kV was utilized to reduce the radiation dose to as low as reasonably achievable. COMPARISON: None available. CLINICAL HISTORY: Neuro deficit, acute, stroke suspected. FINDINGS: BRAIN AND VENTRICLES: The thin convexity subdural hematoma and more lobulated left parietal subdural collection are stable. Minimal subarachnoid blood is stable. 2 mm midline shift is similar to the prior exam. No evidence of acute infarct. No hydrocephalus. ORBITS: No acute abnormality. SINUSES AND MASTOIDS: No acute abnormality. SOFT TISSUES AND SKULL: No acute skull fracture. No acute soft tissue abnormality. alberta stroke program early CT score (aspects) ----- Ganglionic (caudate, ic, lentiform nucleus, insula, M1-m3): 7 Supraganglionic (m4-m6): 3 Total: 10  IMPRESSION: 1. Stable thin convexity subdural hematoma, more lobulated left parietal subdural collection, and minimal subarachnoid hemorrhage. 2. Stable 2 mm midline shift. 3. ASPECTS: 10. 4. These results were communicated to Dr. Vanessa at 11:04 AM on by secure text page via the [facility] messaging system. Electronically signed by: Lonni Necessary MD 03/25/2024 11:05 AM EDT RP Workstation: [id number]   EEG adult Result Date: 03/24/2024 Matthews Elida HERO, MD     03/25/2024  8:49 AM Routine EEG Report Derwin Reddy is a 73 y.o. male with a history of altered mental status who is undergoing an EEG to evaluate for seizures. Report: This EEG was acquired with electrodes placed according to the International 10-20 electrode system (including Fp1, Fp2, F3, F4, C3, C4, P3, P4, O1, O2, T3, T4, T5, T6, A1, A2, Fz, Cz, Pz). The following electrodes were  missing or displaced: none. The occipital dominant rhythm was 6-7 Hz. There is a breach rhythm located over the left frontoparietal region. This activity is reactive to stimulation. Drowsiness was manifested by background fragmentation; deeper stages of sleep were identified by K complexes and sleep spindles. There was no focal slowing. There were no interictal epileptiform discharges. There were no electrographic seizures identified. Photic stimulation and hyperventilation were not performed. Impression and clinical correlation: This EEG was obtained while awake and asleep and is abnormal due to: - mild diffuse slowing indicative of global cerebral dysfunction - breach rhythm over the left frontoparietal region consistent with post-operative skull defect Epileptiform abnormalities were not seen during this recording. Elida Matthews, MD [facility] [phone] If 7pm- 7am, please page neurology on call as listed in AMION.   MR BRAIN WO CONTRAST Result Date: 03/24/2024 EXAM: MRI BRAIN WITHOUT CONTRAST 03/24/2024 02:41:39 AM TECHNIQUE: Multiplanar multisequence MRI of the head/brain was performed without the administration of intravenous contrast. COMPARISON: MRI Brain from yesterday. head CT yesterday and earlier. CLINICAL HISTORY: 73 year old male with headache and neuro deficit. FINDINGS: BRAIN AND VENTRICLES: Subluxation of left vertex craniotomy. Middle meningeal artery embolization demonstrated on CT yesterday. Predominantly thin left hemispheric subdural hematoma with focal more lobulated, biconvex extra axial hemorrhage over the left posterior frontal and parietal convexity (series 11, images 32-39). The confluent hematoma measures 13 to 14 mm in thickness and is unchanged from yesterday's MRI. Small volume left hemisphere subarachnoid hemorrhage also apparent on FLAIR. Stable regional mass effect, perirolandic. No confluent underlying cerebral edema. Trace rightward midline shift is stable. No  ventriculomegaly. Choroid plexus cysts (normal variant) without convincing intraventricular hemorrhage. Basilar cisterns are patent. Stable gray and white matter signal. No acute infarct or new intracranial abnormality. The sella is unremarkable. Normal flow voids. ORBITS: No acute abnormality. SINUSES AND MASTOIDS: No acute abnormality. BONES AND SOFT TISSUES: Normal marrow signal. No acute soft tissue abnormality. IMPRESSION: 1. Stable combined thin left hemispheric and lobulated focal left parietal convexity subdural hematomas (focal hematoma 1314 mm thick). Small volume left hemisphere subarachnoid hemorrhage also stable. 2. Stable regional mass effect with trace rightward midline shift. 3. No new intracranial abnormality. Electronically signed by: Helayne Hurst MD 03/24/2024 05:29 AM EDT RP Workstation: [id number]   MR BRAIN WO CONTRAST Result Date: 03/23/2024 EXAM: MRI BRAIN WITHOUT CONTRAST 03/23/2024 06:47:11 PM TECHNIQUE: Multiplanar multisequence MRI of the head/brain was performed without the administration of intravenous contrast. COMPARISON: Same day CT Head CLINICAL HISTORY: Neuro deficit, acute, stroke suspected. FINDINGS: BRAIN AND VENTRICLES: No acute infarct. Approximately 12 mm thick left parietal convexity extra-axial fluid collection appears similar when comparing across modalities. Similar trace rightward  midline shift. Small to moderate volume of surrounding frontal, parietal, and temporal convexity FLAIR hyperintensity and susceptibility artifact which is compatible with subarachnoid hemorrhage. No mass. No hydrocephalus. Normal flow voids. ORBITS: No acute abnormality. SINUSES AND MASTOIDS: No acute abnormality. BONES AND SOFT TISSUES: Normal marrow signal. No acute soft tissue abnormality. IMPRESSION: 1. Approximately 12 mm thick left parietal convexity extra-axial fluid collection appears similar when comparing across modalities. Similar trace rightward midline shift. 2. Small to  moderate volume of surrounding frontal, parietal, and temporal convexity subarachnoid hemorrhage. This finding may be more conspicuous due to improved visualization by MRI (versus CT), but recommend a short interval follow-up noncontrast head CT to exclude progressive or new subarachnoid hemorrhage. Electronically signed by: Gilmore Molt MD 03/23/2024 08:11 PM EDT RP Workstation: [id number]   CT HEAD WO CONTRAST Result Date: 03/23/2024 EXAM: CT HEAD WITHOUT CONTRAST 03/23/2024 03:23:00 PM TECHNIQUE: CT of the head was performed without the administration of intravenous contrast. Automated exposure control, iterative reconstruction, and/or weight based adjustment of the mA/kV was utilized to reduce the radiation dose to as low as reasonably achievable. COMPARISON: None available. CLINICAL HISTORY: Neuro deficit, acute, stroke suspected. Altered Mental Status; CT HEAD WO CONTRAST; Neuro deficit, acute, stroke suspected; See ED Notes:; Patient's wife states altered mental status and slurred speech for two days. LKW 10/22. States symptoms even worse today. SDH in September and she states these are same symptoms as his presentation then. FINDINGS: BRAIN AND VENTRICLES: A more chronic-appearing subdural collection over the left parietal lobe has decreased in size, now measuring 12 mm maximally on coronal images compared with 19 mm previously. A more hyperdense collection below the calvarium, possibly representing diffuse dural thickening or a component of the subdural collection, has slightly increased since the prior study. Residual mass effect is present with effacement of the sulci over the left convexity. 2 mm midline shift has improved. Minimal extradural fluid below the craniotomy site is stable. No acute hemorrhage. No evidence of acute infarct. No hydrocephalus. ORBITS: Bilateral lens replacements are noted. The globes and orbits are otherwise within normal limits. SINUSES: No acute abnormality. SOFT  TISSUES AND SKULL: No acute soft tissue abnormality. No skull fracture. IMPRESSION: 1. No acute intracranial abnormality. 2. 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 Electronically signed by: Lonni Necessary MD 03/23/2024 03:32 PM EDT RP Workstation: [id number]   CT HEAD WO CONTRAST ( ) Result Date: 03/06/2024 EXAM: CT HEAD WITHOUT CONTRAST 03/01/2024 02:35:00 PM TECHNIQUE: CT of the head was performed without the administration of intravenous contrast. Automated exposure control, iterative reconstruction, and/or weight based adjustment of the mA/kV was utilized to reduce the radiation dose to as low as reasonably achievable. COMPARISON: 01/15/2024 CLINICAL HISTORY: Follow-up subdural hematoma. History of fall in August hitting left temporal area, with symptoms developing 4 weeks later. Status post burr holes. FINDINGS: BRAIN AND VENTRICLES: Decreased size of the left convexity subdural hematoma. The thickest component measures 2.0 cm, which is unchanged, but the AP extent of the collection is decreased. There is no new or acute hemorrhage. There is 4 mm of rightward midline shift without herniation. Status post left frontal craniotomy. No acute infarct. No hydrocephalus. ORBITS: No acute abnormality. SINUSES: No acute abnormality. SOFT TISSUES AND SKULL: Status post left frontal craniotomy. No acute soft tissue abnormality. No skull fracture. IMPRESSION: 1. Decreased size of the left convexity subdural hematoma; thickest component unchanged at 2.0 cm but AP dimension clearly decreased. 2.  Rightward midline shift of 4 mm without herniation 3. No new or acute intracranial hemorrhage Electronically signed by: Franky Stanford MD 03/06/2024 01:17 AM EDT RP Workstation: [id number]       Discharge Exam: Vitals:   03/28/24 0846 03/28/24 1126  BP: (!) 81/64 101/67  Pulse: 91 67  Resp:    Temp: 98.4 F (36.9 C) (!) 97.4 F  (36.3 C)  SpO2: 100%     General: Pt is alert, awake, not in acute distress Cardiovascular: RRR, S1/S2 +, no edema Respiratory: CTA bilaterally, no wheezing, no rhonchi, no respiratory distress, no conversational dyspnea  Abdominal: Soft, NT, ND, bowel sounds + Extremities: no edema, no cyanosis Psych: Normal mood and affect, stable judgement and insight     The results of significant diagnostics from this hospitalization (including imaging, microbiology, ancillary and laboratory) are listed below for reference.     Microbiology: No results found for this or any previous visit (from the past 240 hours).   Labs: BNP (last 3 results) No results for input(s): BNP in the last 8760 hours. Basic Metabolic Panel: Recent Labs  Lab 03/23/24 1517 03/23/24 1525 03/24/24 0305  NA 140 140 137  K 4.6 4.6 3.7  CL 107 104 104  CO2 23  --  24  GLUCOSE 94 94 91  BUN 11 13 12   CREATININE 0.97 1.10 0.99  CALCIUM  8.9  --  8.5*   Liver Function Tests: Recent Labs  Lab 03/23/24 1517  AST 21  ALT 21  ALKPHOS 73  BILITOT 1.6*  PROT 6.4*  ALBUMIN 3.6   No results for input(s): LIPASE, AMYLASE in the last 168 hours. No results for input(s): AMMONIA in the last 168 hours. CBC: Recent Labs  Lab 03/23/24 1517 03/23/24 1525 03/24/24 0305  WBC 6.8  --  6.3  NEUTROABS 4.9  --   --   HGB 12.9* 12.6* 12.0*  HCT 38.5* 37.0* 34.9*  MCV 91.0  --  89.0  PLT 198  --  190   Cardiac Enzymes: No results for input(s): CKTOTAL, CKMB, CKMBINDEX, TROPONINI in the last 168 hours. BNP: Invalid input(s): POCBNP CBG: Recent Labs  Lab 03/25/24 1040 03/25/24 2018  GLUCAP 90 95   D-Dimer No results for input(s): DDIMER in the last 72 hours. Hgb A1c No results for input(s): HGBA1C in the last 72 hours. Lipid Profile No results for input(s): CHOL, HDL, LDLCALC, TRIG, CHOLHDL, LDLDIRECT in the last 72 hours. Thyroid  function studies No results for input(s):  TSH, T4TOTAL, T3FREE, THYROIDAB in the last 72 hours.  Invalid input(s): FREET3 Anemia work up No results for input(s): VITAMINB12, FOLATE, FERRITIN, TIBC, IRON, RETICCTPCT in the last 72 hours. Urinalysis    Component Value Date/Time   COLORURINE YELLOW 02/15/2024 1711   APPEARANCEUR CLEAR 02/15/2024 1711   LABSPEC 1.005 02/15/2024 1711   PHURINE 6.0 02/15/2024 1711   GLUCOSEU NEGATIVE 02/15/2024 1711   HGBUR NEGATIVE 02/15/2024 1711   BILIRUBINUR negative 02/20/2024 0831   KETONESUR negative 02/20/2024 0831   KETONESUR NEGATIVE 02/15/2024 1711   PROTEINUR negative 02/20/2024 0831   PROTEINUR NEGATIVE 02/15/2024 1711   UROBILINOGEN 2.0 (A) 02/20/2024 0831   NITRITE Negative 02/20/2024 0831   NITRITE NEGATIVE 02/15/2024 1711   LEUKOCYTESUR Negative 02/20/2024 0831   LEUKOCYTESUR NEGATIVE 02/15/2024 1711   Sepsis Labs Recent Labs  Lab 03/23/24 1517 03/24/24 0305  WBC 6.8 6.3   Microbiology No results found for this or any previous visit (from the past 240 hours).   Patient was  seen and examined on the day of discharge and was found to be in stable condition. Time coordinating discharge: 40 minutes including assessment and coordination of care, as well as examination of the patient.   SIGNED:  Delon Hoe, DO [facility] 03/28/2024, 11:45 AM

## 2024-03-28 NOTE — Care Management Important Message (Signed)
 Important Message  Patient Details  Name: Edwin Dickerson MRN: [id number] Date of Birth: 04-12-1951   Important Message Given:  Yes - Medicare IM  Patient left prior to IM delivery will mail a copy to the patient home address.   Homero Hyson 03/28/2024, 3:21 PM

## 2024-03-28 NOTE — Discharge Instructions (Signed)
Per [place] DMV statutes, patients with seizures are not allowed to drive until they have been seizure-free for six months. Use caution when using heavy equipment or power tools. Avoid working on ladders or at heights. Take showers instead of baths. Ensure the water temperature is not too high on the home water heater. Do not go swimming alone. When caring for infants or small children, sit down when holding, feeding, or changing them to minimize risk of injury to the child in the event you have a seizure. Also, Maintain good sleep hygiene. Avoid alcohol. ° °If patient has another seizure, call 911 and bring them back to the ED if: °A.  The seizure lasts longer than 5 minutes.      °B.  The patient doesn't wake shortly after the seizure or has new problems such as difficulty seeing, speaking or moving following the seizure °C.  The patient was injured during the seizure °D.  The patient has a temperature over 102 F (39C) °E.  The patient vomited during the seizure and now is having trouble breathing °  ° °

## 2024-03-30 ENCOUNTER — Other Ambulatory Visit

## 2024-04-05 ENCOUNTER — Other Ambulatory Visit (HOSPITAL_COMMUNITY): Payer: Self-pay | Admitting: Neurological Surgery

## 2024-04-05 ENCOUNTER — Other Ambulatory Visit: Payer: Self-pay | Admitting: Neurological Surgery

## 2024-04-05 DIAGNOSIS — S065XAA Traumatic subdural hemorrhage with loss of consciousness status unknown, initial encounter: Secondary | ICD-10-CM

## 2024-04-09 NOTE — Progress Notes (Signed)
 Surgical Instructions   Your procedure is scheduled on Monday April 16, 2024. Report to [facility]tr[facility] A at 12:00 Noon, then check in with the Admitting office. Any questions or running late day of surgery: call [phone]  Questions prior to your surgery date: call [phone], Monday-Friday, 8am-4pm. If you experience any cold or flu symptoms such as cough, fever, chills, shortness of breath, etc. between now and your scheduled surgery, please notify us  at the above number.     Remember:  Do not eat after midnight the night before your surgery   You may drink clear liquids until 11:00 the morning of your surgery.   Clear liquids allowed are: Water, Non-Citrus Juices (without pulp), Carbonated Beverages, Clear Tea (no milk, honey, etc.), Black Coffee Only (NO MILK, CREAM OR POWDERED CREAMER of any kind), and Gatorade.    Take these medicines the morning of surgery with A SIP OF WATER  atorvastatin  (LIPITOR)  levETIRAcetam  (KEPPRA )   May take these medicines IF NEEDED: acetaminophen  (TYLENOL )  cetirizine (ZYRTEC)   One week prior to surgery, STOP taking any Aspirin  (unless otherwise instructed by your surgeon) Aleve, Naproxen, Ibuprofen, Motrin, Advil, Goody's, BC's, all herbal medications, fish oil, and non-prescription vitamins.                     Do NOT Smoke (Tobacco/Vaping) for 24 hours prior to your procedure.  If you use a CPAP at night, you may bring your mask/headgear for your overnight stay.   You will be asked to remove any contacts, glasses, piercing's, hearing aid's, dentures/partials prior to surgery. Please bring cases for these items if needed.    Patients discharged the day of surgery will not be allowed to drive home, and someone needs to stay with them for 24 hours.  SURGICAL WAITING ROOM VISITATION Patients may have no more than 2 support people in the waiting area - these visitors may rotate.   Pre-op nurse will coordinate an appropriate  time for 1 ADULT support person, who may not rotate, to accompany patient in pre-op.  Children under the age of 42 must have an adult with them who is not the patient and must remain in the main waiting area with an adult.  If the patient needs to stay at the hospital during part of their recovery, the visitor guidelines for inpatient rooms apply.  Please refer to the [facility] website for the visitor guidelines for any additional information.   If you received a COVID test during your pre-op visit  it is requested that you wear a mask when out in public, stay away from anyone that may not be feeling well and notify your surgeon if you develop symptoms. If you have been in contact with anyone that has tested positive in the last 10 days please notify you surgeon.      Pre-operative CHG Bathing Instructions   You can play a key role in reducing the risk of infection after surgery. Your skin needs to be as free of germs as possible. You can reduce the number of germs on your skin by washing with CHG (chlorhexidine  gluconate) soap before surgery. CHG is an antiseptic soap that kills germs and continues to kill germs even after washing.   DO NOT use if you have an allergy to chlorhexidine /CHG or antibacterial soaps. If your skin becomes reddened or irritated, stop using the CHG and notify one of our RNs at [phone].  TAKE A SHOWER THE NIGHT BEFORE SURGERY   Please keep in mind the following:    You may shave your face before/day of surgery.  Place clean sheets on your bed the night before surgery Use a clean washcloth (not used since being washed) for shower. DO NOT sleep with pet's night before surgery.  CHG Shower Instructions:  Wash your face and private area with normal soap. If you choose to wash your hair, wash first with your normal shampoo.  After you use shampoo/soap, rinse your hair and body thoroughly to remove shampoo/soap residue.  Turn the water OFF and apply  half the bottle of CHG soap to a CLEAN washcloth.  Apply CHG soap ONLY FROM YOUR NECK DOWN TO YOUR TOES (washing for 3-5 minutes)  DO NOT use CHG soap on face, private areas, open wounds, or sores.  Pay special attention to the area where your surgery is being performed.  If you are having back surgery, having someone wash your back for you may be helpful. Wait 2 minutes after CHG soap is applied, then you may rinse off the CHG soap.  Pat dry with a clean towel  Put on clean pajamas    Additional instructions for the day of surgery: If you choose, you may shower the morning of surgery with an antibacterial soap.  DO NOT APPLY any lotions, deodorants or cologne.   Do not wear jewelry Do not bring valuables to the hospital. [facility] is not responsible for valuables/personal belongings. Put on clean/comfortable clothes.  Please brush your teeth.  Ask your nurse before applying any prescription medications to the skin.

## 2024-04-10 ENCOUNTER — Encounter (HOSPITAL_COMMUNITY)
Admission: RE | Admit: 2024-04-10 | Discharge: 2024-04-10 | Disposition: A | Discharge status: Home / Self Care | Source: Ambulatory Visit | Attending: Neurological Surgery | Admitting: Neurological Surgery

## 2024-04-10 ENCOUNTER — Other Ambulatory Visit: Payer: Self-pay

## 2024-04-10 ENCOUNTER — Encounter (HOSPITAL_COMMUNITY): Payer: Self-pay

## 2024-04-10 DIAGNOSIS — S065XAA Traumatic subdural hemorrhage with loss of consciousness status unknown, initial encounter: Secondary | ICD-10-CM | POA: Diagnosis not present

## 2024-04-10 DIAGNOSIS — I110 Hypertensive heart disease with heart failure: Secondary | ICD-10-CM | POA: Insufficient documentation

## 2024-04-10 DIAGNOSIS — Z01818 Encounter for other preprocedural examination: Secondary | ICD-10-CM | POA: Insufficient documentation

## 2024-04-10 DIAGNOSIS — Z8673 Personal history of transient ischemic attack (TIA), and cerebral infarction without residual deficits: Secondary | ICD-10-CM | POA: Diagnosis not present

## 2024-04-10 DIAGNOSIS — X58XXXA Exposure to other specified factors, initial encounter: Secondary | ICD-10-CM | POA: Diagnosis not present

## 2024-04-10 HISTORY — DX: Pneumonia, unspecified organism: J18.9

## 2024-04-10 HISTORY — DX: Benign prostatic hyperplasia without lower urinary tract symptoms: N40.0

## 2024-04-10 NOTE — Progress Notes (Signed)
 Patient's wife [name] is present during PAT visit per patient request.  PCP - Dr Signe Ano Cardiologist - none  Chest x-ray - 02/15/24 EKG - 03/26/24 Stress Test - n/a ECHO - 02/16/24 Cardiac Cath - n/a  ICD Pacemaker/Loop - n/a  Sleep Study -  n/a  Diabetes - n/a  Aspirin  & Blood Thinner Instructions:  n/a  ERAS - clear liquids til 11 AM DOS  Anesthesia review: Yes  STOP now taking any Aspirin  (unless otherwise instructed by your surgeon), Aleve, Naproxen, Ibuprofen, Motrin, Advil, Goody's, BC's, all herbal medications, fish oil, and all vitamins.   Coronavirus Screening Do you have any of the following symptoms:  Cough yes/no: No Fever (>100.70F)  yes/no: No Runny nose yes/no: No Sore throat yes/no: No Difficulty breathing/shortness of breath  yes/no: No  Have you traveled in the last 14 days and where? yes/no: No  Patient verbalized understanding of instructions that were given to them at the PAT appointment. Patient was also instructed that they will need to review over the PAT instructions again at home before surgery.

## 2024-04-11 ENCOUNTER — Other Ambulatory Visit (HOSPITAL_BASED_OUTPATIENT_CLINIC_OR_DEPARTMENT_OTHER): Admitting: Radiology

## 2024-04-11 NOTE — Anesthesia Preprocedure Evaluation (Signed)
 Anesthesia Evaluation    Airway        Dental   Pulmonary           Cardiovascular hypertension,   Echo 02/16/2024: IMPRESSIONS  1. Left ventricular ejection fraction, by estimation, is 60 to 65%. The left ventricle has normal function. The left ventricle has no regional wall motion abnormalities. There is mild concentric left ventricular hypertrophy. Left ventricular diastolic parameters are consistent with Grade I diastolic dysfunction (impaired relaxation).  2. Right ventricular systolic function is normal. The right ventricular  size is normal. There is normal pulmonary artery systolic pressure.  3. The mitral valve is normal in structure. Trivial mitral valve regurgitation. No evidence of mitral stenosis.  4. The aortic valve is tricuspid. Aortic valve regurgitation is not  visualized. No aortic stenosis is present.  5. The inferior vena cava is normal in size with greater than 50%  respiratory variability, suggesting right atrial pressure of 3 mmHg.      Neuro/Psych    GI/Hepatic   Endo/Other    Renal/GU      Musculoskeletal   Abdominal   Peds  Hematology   Anesthesia Other Findings   Reproductive/Obstetrics                              Anesthesia Physical Anesthesia Plan  ASA:   Anesthesia Plan:    Post-op Pain Management:    Induction:   PONV Risk Score and Plan:   Airway Management Planned:   Additional Equipment:   Intra-op Plan:   Post-operative Plan:   Informed Consent:   Plan Discussed with:   Anesthesia Plan Comments: (PAT note written 04/11/2024 by Grae Leathers, PA-C.  )        Anesthesia Quick Evaluation

## 2024-04-11 NOTE — Progress Notes (Signed)
 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 He went to the OR for left craniotomy for SDH evaluation. He went to [facility] on 02/06/2024 - 02/14/2024. Readmitted dizziness with orthostatic hypotension 02/15/2024. Imaging revealed a left parietal subacute/chronic SDH with mild mass effect. S/p Onyx embolization of left middle meningeal artery 02/17/2024 and discharged 02/19/2024. Readmission 03/23/2024 - 03/28/2024 for AMS, dysarthria/some aphasia, dizziness. There was also question of some focal seizure activity. He was started on Keppra . CT showed chronic left parietal SDH reduced to 12 mm with MRI suggesting new surrounding frontal, parietotemporal SAH. Symptoms attributed to SDH/SAH. Serial imaging and EEGs ordered. Recurrent episode  of AMS, aphasia and right sided weakness on 03/25/2024 with concern for worsening hemorrhage or seizure. Had transient aphasia with prior TIA in 2018. Code stroke called, and CT head significant for stable hematoma/SAH. Continuous EEG showed no clear seizure but did have left sided sharp waves, so episodes felt likely electrographic cerebral dysfunction akin to seizures. No recurrent aphasia. Discharged on Keppra . Losartan  and finasteride  held due to hypotension. Plan to follow-up with neurology and neurosurgery. He is now scheduled for the above procedure.   Anesthesia team to evaluate on the day of surgery.    VS: BP 120/68   Pulse (!) 58   Temp 36.5 C   Resp 16   Ht 5' 7 (1.702 m)   Wt 91.2 kg   SpO2 99%   BMI 31.48 kg/m   PROVIDERS: Marelyn Axon, MD is PCP    LABS: Most recent labs in [facility] include: Lab Results  Component Value Date   WBC 6.3 03/24/2024   HGB 12.0 (L) 03/24/2024   HCT 34.9 (L) 03/24/2024   PLT 190 03/24/2024   GLUCOSE 91 03/24/2024   ALT 21 03/23/2024   AST 21 03/23/2024   NA 137 03/24/2024   K 3.7 03/24/2024   CL 104 03/24/2024   CREATININE 0.99 03/24/2024   BUN 12 03/24/2024   CO2 24 03/24/2024   TSH 2.382 02/17/2024   INR 1.0 03/23/2024   HGBA1C 5.0 02/13/2024     OTHER: cEEG 03/26/2024 1413 - 03/27/2024 1000:  This study showed evidence of epileptogenicity arising from left temporo-parietal region. Additionally there is cortical dysfunction arising from  left hemisphere likely secondary to underlying structural abnormality. No definite seizures were seen throughout the recording.   Event button was pressed 5 times within 2216 to 2247 for  trouble speaking without concomitant EEG change.  However, there are sharp waves as well as slowing in the left temporoparietal region.  And focal seizures with localization from deeper brain structures may not always show up on scalp EEG.  Therefore clinical correlation is recommended.    IMAGES: MRI Brain  03/24/2024: IMPRESSION: 1. Stable combined thin left hemispheric and lobulated focal left parietal convexity subdural hematomas (focal hematoma 1314 mm thick). Small volume left hemisphere subarachnoid hemorrhage also stable. 2. Stable regional mass effect with trace rightward midline shift. 3. No new intracranial abnormality.    EKG: EKG 03/23/2024: Sinus rhythm Nonspecific IVCD with LAD Left ventricular hypertrophy Since last tracing rate slower Confirmed by Dean Clarity [phone]) on 03/23/2024 3:16:19 PM   CV: Echo 02/16/2024: IMPRESSIONS   1. Left ventricular ejection fraction, by estimation, is 60 to 65%. The left ventricle has normal function. The left ventricle has no regional wall motion abnormalities. There is mild concentric left ventricular hypertrophy. Left ventricular diastolic parameters are consistent with Grade I diastolic dysfunction (impaired relaxation).   2. Right ventricular systolic function is normal. The right ventricular  size is normal. There is normal pulmonary artery systolic pressure.   3. The mitral valve is normal in structure. Trivial mitral valve regurgitation. No evidence of mitral stenosis.   4. The aortic valve is tricuspid. Aortic valve regurgitation is not  visualized. No aortic stenosis is present.   5. The inferior vena cava is normal in size with greater than 50%  respiratory variability, suggesting right atrial pressure of 3 mmHg.    Past Medical History:  Diagnosis Date   BPH (benign prostatic hyperplasia)    High cholesterol    Hypertension    Pneumonia    x 1   SDH (subdural hematoma) (HCC)    Seizure (HCC) 03/23/2024   seizure spikes'   Stroke (HCC) 02/23/2024   mini stroke - no deficits    Past Surgical History:  Procedure Laterality Date   CRANIOTOMY Left 02/01/2024   Procedure: CRANIOTOMY HEMATOMA EVACUATION SUBDURAL;  Surgeon: Joshua Alm Hamilton, MD;  Location: [facility] OR;  Service: Neurosurgery;  Laterality: Left;   IR  ANGIO EXTERNAL CAROTID SEL EXT CAROTID UNI L MOD SED  02/17/2024   IR ANGIO INTRA EXTRACRAN SEL INTERNAL CAROTID UNI L MOD SED  02/22/2024   IR ANGIOGRAM FOLLOW UP STUDY  02/22/2024   IR ANGIOGRAM FOLLOW UP STUDY  02/17/2024   IR NEURO EACH ADD'L AFTER BASIC UNI LEFT (MS)  02/17/2024   IR TRANSCATH/EMBOLIZ  02/17/2024   PARS PLANA REPAIR OF RETINAL DEATACHMENT Left 10/2023   RADIOLOGY WITH ANESTHESIA N/A 02/17/2024   Procedure: RADIOLOGY WITH ANESTHESIA;  Surgeon: Lanis Pupa, MD;  Location: [facility] OR;  Service: Radiology;  Laterality: N/A;  EMBOLIZATION    MEDICATIONS:  acetaminophen  (TYLENOL ) 325 MG tablet   atorvastatin  (LIPITOR) 40 MG tablet   B Complex-C (B-COMPLEX WITH VITAMIN C) tablet   cetirizine (ZYRTEC) 10 MG tablet   clonazePAM  (KLONOPIN ) 0.5 MG tablet   docusate sodium  (COLACE) 100 MG capsule   [Paused] finasteride  (PROSCAR ) 5 MG tablet   levETIRAcetam  (KEPPRA ) 750 MG tablet   No current facility-administered medications for this encounter.    Isaiah Ruder, PA-C Surgical Short Stay/Anesthesiology [facility] Phone [phone] [facility] Phone [phone] 04/11/2024 8:10 PM

## 2024-04-13 ENCOUNTER — Ambulatory Visit (INDEPENDENT_AMBULATORY_CARE_PROVIDER_SITE_OTHER)
Admission: RE | Admit: 2024-04-13 | Discharge: 2024-04-13 | Disposition: A | Discharge status: Home / Self Care | Source: Ambulatory Visit | Attending: Neurological Surgery | Admitting: Neurological Surgery

## 2024-04-13 DIAGNOSIS — S065XAA Traumatic subdural hemorrhage with loss of consciousness status unknown, initial encounter: Secondary | ICD-10-CM

## 2024-04-16 ENCOUNTER — Encounter: Admitting: Physical Medicine and Rehabilitation

## 2024-04-20 ENCOUNTER — Inpatient Hospital Stay (HOSPITAL_COMMUNITY): Admission: RE | Admit: 2024-04-20 | Source: Home / Self Care | Admitting: Neurological Surgery

## 2024-04-20 ENCOUNTER — Encounter (HOSPITAL_COMMUNITY): Admission: RE | Payer: Self-pay | Source: Home / Self Care

## 2024-04-20 ENCOUNTER — Encounter (HOSPITAL_COMMUNITY): Payer: Self-pay | Admitting: Vascular Surgery

## 2024-04-20 SURGERY — CREATION, CRANIAL BURR HOLE
Anesthesia: General | Laterality: Left

## 2024-06-05 ENCOUNTER — Ambulatory Visit (INDEPENDENT_AMBULATORY_CARE_PROVIDER_SITE_OTHER): Admitting: Neurology

## 2024-06-05 ENCOUNTER — Encounter: Payer: Self-pay | Admitting: Neurology

## 2024-06-05 VITALS — BP 132/72 | HR 59 | Ht 67.0 in | Wt 208.5 lb

## 2024-06-05 DIAGNOSIS — S065X9S Traumatic subdural hemorrhage with loss of consciousness of unspecified duration, sequela: Secondary | ICD-10-CM

## 2024-06-05 DIAGNOSIS — R4701 Aphasia: Secondary | ICD-10-CM

## 2024-06-05 DIAGNOSIS — I609 Nontraumatic subarachnoid hemorrhage, unspecified: Secondary | ICD-10-CM

## 2024-06-05 MED ORDER — LEVETIRACETAM 500 MG PO TABS: 500.0000 mg | ORAL_TABLET | Freq: Two times a day (BID) | ORAL | 0 refills | Status: AC

## 2024-06-05 NOTE — Patient Instructions (Addendum)
 Continue current medications  Will continue with Keppra  500 mg twice daily for the next 3 months. At next refill, if there is continuous improvement, will decrease to 500 mg daily for a month, then stop the medication Continue to follow up with Dr. Joshua Neurosurgery  Return in 6 months or sooner if worse

## 2024-06-05 NOTE — Progress Notes (Signed)
 "   [facility]  PATIENT: Edwin Dickerson DOB: August 29, 1950  REQUESTING CLINICIAN: Remi Pippin, NP HISTORY FROM: Patient and son  REASON FOR VISIT: Intracranial hemorrage    HISTORICAL  CHIEF COMPLAINT:  Chief Complaint  Patient presents with   New Patient (Initial Visit)    Room 13 With son referral for aphasia    HISTORY OF PRESENT ILLNESS:  Discussed the use of AI scribe software for clinical note transcription with the patient, who gave verbal consent to proceed.  Edwin Dickerson is a 74 year old male with a history of subdural hemorrhage s/p crani and hematoma evacuation who presents for follow-up on his condition.  He initially presented to the hospital with right sided weakness, confusion after a fall 4 weeks earlier. He was also complaining of headaches. Initial images showed mixed density subdural hematoma along the left frontal convexity.  Patient presented with [facility] arms September 17 for dizziness, CT head again showed mixed attenuation of subdural collection.  He underwent a MMA embolization. He Edwin Dickerson presented in October, 24 for increased confusion and aphasia.  At the time her CT angiogram showed subarachnoid hemorrhage.  He was seen by neurosurgery, and they recommended outpatient follow-up.  He did follow-up with Dr. Joshua last week, was told that her repeat scan showed improvement of the bleeding and he is set up to see him again this Thursday.  During his last admission on October 24, he was discharged on Keppra , 70 mg twice daily.  He tells me that he was ran out of medication and started spacing the Keppra  every 2 days.  He has not had any seizure or any fall and recurrent aphasia.   OTHER MEDICAL CONDITIONS: Chronic orthostatics, hyperlipidemia, history of subdural bleed and subarachnoid bleed   REVIEW OF SYSTEMS: Full 14 system review of systems performed and negative with exception of: As noted in the HPI    ALLERGIES: Allergies[1]  HOME MEDICATIONS: Outpatient Medications Prior to Visit  Medication Sig Dispense Refill   acetaminophen  (TYLENOL ) 325 MG tablet Take 2 tablets (650 mg total) by mouth every 4 (four) hours as needed for mild pain (pain score 1-3) (temp > 100.5).     atorvastatin  (LIPITOR) 40 MG tablet Take 1 tablet (40 mg total) by mouth daily at 6 PM. 30 tablet 0   B Complex-C (B-COMPLEX WITH VITAMIN C) tablet Take 1 tablet by mouth daily. 30 tablet 0   cetirizine (ZYRTEC) 10 MG tablet Take 10 mg by mouth daily as needed for allergies.     clonazePAM  (KLONOPIN ) 0.5 MG tablet Take 1 tablet (0.5 mg total) by mouth at bedtime. 30 tablet 0   docusate sodium  (COLACE) 100 MG capsule Take 1 capsule (100 mg total) by mouth daily. (Patient taking differently: Take 100 mg by mouth daily as needed for mild constipation.)     finasteride  (PROSCAR ) 5 MG tablet Take 1 tablet (5 mg total) by mouth daily. 30 tablet 0   levETIRAcetam  (KEPPRA ) 750 MG tablet Take 1 tablet (750 mg total) by mouth 2 (two) times daily. 60 tablet 1   No facility-administered medications prior to visit.    PAST MEDICAL HISTORY: Past Medical History:  Diagnosis Date   BPH (benign prostatic hyperplasia)    High cholesterol    Hypertension    Pneumonia    x 1   SDH (subdural hematoma) (HCC)    Seizure (HCC) 03/23/2024   seizure spikes'   Stroke (HCC) 02/23/2024   mini stroke - no deficits  PAST SURGICAL HISTORY: Past Surgical History:  Procedure Laterality Date   CRANIOTOMY Left 02/01/2024   Procedure: CRANIOTOMY HEMATOMA EVACUATION SUBDURAL;  Surgeon: Joshua Alm Hamilton, MD;  Location: [facility] OR;  Service: Neurosurgery;  Laterality: Left;   IR ANGIO EXTERNAL CAROTID SEL EXT CAROTID UNI L MOD SED  02/17/2024   IR ANGIO INTRA EXTRACRAN SEL INTERNAL CAROTID UNI L MOD SED  02/22/2024   IR ANGIOGRAM FOLLOW UP STUDY  02/22/2024   IR ANGIOGRAM FOLLOW UP STUDY  02/17/2024   IR NEURO EACH ADD'L AFTER BASIC UNI LEFT  (MS)  02/17/2024   IR TRANSCATH/EMBOLIZ  02/17/2024   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 No pertinent family history.  SOCIAL HISTORY: Social History   Socioeconomic History   Marital status: Married    Spouse name: Not on file   Number of children: Not on file   Years of education: Not on file   Highest education level: Not on file  Occupational History   Not on file  Tobacco Use   Smoking status: Never   Smokeless tobacco: Never  Vaping Use   Vaping status: Never Used  Substance and Sexual Activity   Alcohol use: No   Drug use: No   Sexual activity: Not Currently  Other Topics Concern   Not on file  Social History Narrative   Not on file   Social Drivers of Health   Tobacco Use: Low Risk (06/05/2024)   Patient History    Smoking Tobacco Use: Never    Smokeless Tobacco Use: Never    Passive Exposure: Not on file  Financial Resource Strain: Not on file  Food Insecurity: No Food Insecurity (03/23/2024)   Epic    Worried About Programme Researcher, Broadcasting/film/video in the Last Year: Never true    Ran Out of Food in the Last Year: Never true  Transportation Needs: No Transportation Needs (03/23/2024)   Epic    Lack of Transportation (Medical): No    Lack of Transportation (Non-Medical): No  Physical Activity: Not on file  Stress: Not on file  Social Connections: Moderately Isolated (03/23/2024)   Social Connection and Isolation Panel    Frequency of Communication with Friends and Family: Twice a week    Frequency of Social Gatherings with Friends and Family: Once a week    Attends Religious Services: Never    Database Administrator or Organizations: No    Attends Banker Meetings: Never    Marital Status: Married  Careers Information Officer Violence: Not At Risk (03/23/2024)   Epic    Fear of Current or Ex-Partner: No    Emotionally Abused: No    Physically Abused: No    Sexually Abused: No  Depression (PHQ2-9): Not on file  Alcohol Screen: Not on file  Housing: Low Risk (03/23/2024)   Epic    Unable to Pay for Housing in the Last Year: No    Number of Times Moved in the Last Year: 0    Homeless in the Last Year: No  Utilities: Not At Risk (03/23/2024)   Epic    Threatened with loss of utilities: No  Health Literacy: Not on file    PHYSICAL EXAM  GENERAL EXAM/CONSTITUTIONAL: Vitals:  Vitals:   06/05/24 0946  BP: 132/72  Pulse: (!) 59  SpO2: 95%  Weight: 208 lb 8 oz (94.6 kg)  Height: 5' 7 (1.702 m)   Body mass index is 32.66 kg/m. Wt Readings from Last 3 Encounters:  06/05/24 208 lb 8 oz (94.6 kg)  04/10/24 201 lb (91.2 kg)  03/23/24 187 lb 2.7 oz (84.9 kg)   Patient is in no distress; well developed, nourished and groomed; neck is supple  MUSCULOSKELETAL: Gait, strength, tone, movements noted in Neurologic exam below  NEUROLOGIC: MENTAL STATUS:      No data to display         awake, alert, oriented to person, place and time recent and remote memory intact normal attention and concentration language fluent, comprehension intact, naming intact fund of knowledge appropriate  CRANIAL NERVE:  2nd, 3rd, 4th, 6th - Visual fields full to confrontation, extraocular muscles intact, no nystagmus 5th - facial sensation symmetric 7th - facial strength symmetric 8th - hearing intact 9th - palate elevates symmetrically, uvula midline 11th - shoulder shrug symmetric 12th - tongue protrusion midline  MOTOR:  normal bulk and tone, full strength in the BUE, BLE  SENSORY:  normal and symmetric to light touch  COORDINATION:  finger-nose-finger, fine finger movements normal  GAIT/STATION:  normal    DIAGNOSTIC DATA (LABS, IMAGING, TESTING) - I reviewed patient records, labs, notes,  testing and imaging myself where available.  Lab Results  Component Value Date   WBC 6.3 03/24/2024   HGB 12.0 (L) 03/24/2024   HCT 34.9 (L) 03/24/2024   MCV 89.0 03/24/2024   PLT 190 03/24/2024      Component Value Date/Time   NA 137 03/24/2024 0305   K 3.7 03/24/2024 0305   CL 104 03/24/2024 0305   CO2 24 03/24/2024 0305   GLUCOSE 91 03/24/2024 0305   BUN 12 03/24/2024 0305   CREATININE 0.99 03/24/2024 0305   CALCIUM  8.5 (L) 03/24/2024 0305   PROT 6.4 (L) 03/23/2024 1517   ALBUMIN 3.6 03/23/2024 1517   AST 21 03/23/2024 1517   ALT 21 03/23/2024 1517   ALKPHOS 73 03/23/2024 1517   BILITOT 1.6 (H) 03/23/2024 1517   GFRNONAA >60 03/24/2024 0305   GFRAA >60 10/22/2016 1808   Lab Results  Component Value Date   CHOL 236 (H) 10/23/2016   HDL 33 (L) 10/23/2016   LDLCALC 171 (H) 10/23/2016   TRIG 158 (H) 10/23/2016   CHOLHDL 7.2 10/23/2016   Lab Results  Component Value Date   HGBA1C 5.0 02/13/2024   No results found for: VITAMINB12 Lab Results  Component Value Date   TSH 2.382 02/17/2024    CT Head 02/01/2024 1. Mixed density subdural hematoma centered along the left frontal convexity (measuring up to 2.3 mm in thickness). Mass effect upon the underlying brain parenchyma with 8 mm rightward midline shift. 2. Mild cerebral atrophy  CT Head 02/15/2024 1. Mixed attenuation subdural collection overlying the left cerebral convexity, measuring up to 1.9 cm in maximum thickness, previously 1.6 cm measuring in a similar manner. Associated 5 mm rightward midline shift. The collection has slightly increased over the left parietal lobe and is otherwise similar to slightly decreased over the left frontal lobe compared to prior study. 2. Nearly complete resolution of previously noted subarachnoid hemorrhage. 3. Slightly improved caliber of the lateral ventricles compared to prior study.  CT Head 03/01/2024 1. Decreased size of the left convexity subdural hematoma; thickest  component unchanged at 2.0 cm but AP dimension clearly decreased. 2. Rightward midline shift of 4  mm without herniation 3. No new or acute intracranial hemorrhage  CT Head 03/13/2024 1. No acute intracranial abnormality. 2. 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  CT Head 04/13/2024 1. Improved left subdural hematoma since last month, particularly the lobulated left parietal component. Residual SDH now 5 mm or less across the left hemisphere. No midline shift or significant mass effect. 2. Stable left superior convexity craniotomy and left middle meningeal artery embolization. 3. No new intracranial hemorrhage.   ASSESSMENT AND PLAN  74 y.o. year old male with    Intracranial hemorrhage Recent nontraumatic intracranial hemorrhage with initial difficulty in speech and word-finding. Recent CT scan shows resolution of the bleed. No current headaches, weakness, or numbness. Vision issues related to previous retinal detachment surgery, not directly related to the hemorrhage. - Continue follow-up with Dr. Joshua on Thursday for further evaluation of the hemorrhage. - Scheduled follow-up appointment in six months to reassess condition.  Seizure prophylaxis with antiepileptic drug therapy Seizure prophylaxis initiated due to concern for seizures following the intracranial hemorrhage. Currently has been self-reducing dosage. No seizures reported. Plan to continue prophylaxis as a precaution due to potential irritant effect of the bleed on the brain. Once cleared by Dr. Joshua, medication will be discontinued. - Prescribed Keppra  for 90 days with a possibility of refill, to be taken twice daily. - Will check in by phone in three months to assess condition and will reduce Keppra  to once daily if repeat CT shows resolution of the bleeding. - Will schedule follow-up appointment in six months, with the option for a  video visit if he feels well.     1. Traumatic subdural hematoma with loss of consciousness, sequela   2. Subarachnoid hemorrhage (HCC)   3. Aphasia      Patient Instructions  Continue current medications  Will continue with Keppra  500 mg twice daily for the next 3 months. At next refill, if there is continuous improvement, will decrease to 500 mg daily for a month, then stop the medication Continue to follow up with Dr. Joshua Neurosurgery  Return in 6 months or sooner if worse   No orders of the defined types were placed in this encounter.   Meds ordered this encounter  Medications   levETIRAcetam  (KEPPRA ) 500 MG tablet    Sig: Take 1 tablet (500 mg total) by mouth 2 (two) times daily.    Dispense:  180 tablet    Refill:  0    Return in about 6 months (around 12/03/2024).    Pastor Falling, MD 06/05/2024, 3:15 PM  [facility] [address], [address] [place], [place] [address] [phone]     [1]  Allergies Allergen Reactions   Penicillins Rash   "

## 2024-06-12 ENCOUNTER — Encounter: Admitting: Physical Medicine and Rehabilitation

## 2024-07-31 ENCOUNTER — Encounter: Admitting: Physical Medicine and Rehabilitation

## 2025-02-05 ENCOUNTER — Ambulatory Visit: Admitting: Neurology
